# Patient Record
Sex: Female | Born: 1980 | Race: Black or African American | Hispanic: No | Marital: Single | Smoking: Never smoker
Health system: Southern US, Community
[De-identification: ages and names within clinical notes are randomized; demographics above are authoritative.]

## PROBLEM LIST (undated history)

## (undated) DIAGNOSIS — J42 Unspecified chronic bronchitis: Secondary | ICD-10-CM

## (undated) DIAGNOSIS — F32A Depression, unspecified: Secondary | ICD-10-CM

## (undated) DIAGNOSIS — D219 Benign neoplasm of connective and other soft tissue, unspecified: Secondary | ICD-10-CM

## (undated) DIAGNOSIS — F329 Major depressive disorder, single episode, unspecified: Secondary | ICD-10-CM

## (undated) DIAGNOSIS — A159 Respiratory tuberculosis unspecified: Secondary | ICD-10-CM

## (undated) HISTORY — DX: Benign neoplasm of connective and other soft tissue, unspecified: D21.9

## (undated) HISTORY — PX: HYSTEROSCOPY: SHX211

---
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
## 2000-07-01 ENCOUNTER — Ambulatory Visit (HOSPITAL_COMMUNITY): Admission: RE | Admit: 2000-07-01 | Discharge: 2000-07-01 | Payer: Self-pay | Admitting: Family Medicine

## 2000-07-01 ENCOUNTER — Other Ambulatory Visit: Admission: RE | Admit: 2000-07-01 | Discharge: 2000-07-01 | Payer: Self-pay | Admitting: Family Medicine

## 2000-07-01 ENCOUNTER — Encounter: Payer: Self-pay | Admitting: Family Medicine

## 2000-10-30 ENCOUNTER — Emergency Department (HOSPITAL_COMMUNITY): Admission: EM | Admit: 2000-10-30 | Discharge: 2000-10-30 | Payer: Self-pay | Admitting: Emergency Medicine

## 2000-10-30 ENCOUNTER — Encounter: Payer: Self-pay | Admitting: Emergency Medicine

## 2001-04-02 ENCOUNTER — Emergency Department (HOSPITAL_COMMUNITY): Admission: EM | Admit: 2001-04-02 | Discharge: 2001-04-02 | Payer: Self-pay | Admitting: Emergency Medicine

## 2001-04-29 ENCOUNTER — Encounter: Payer: Self-pay | Admitting: Obstetrics and Gynecology

## 2001-04-29 ENCOUNTER — Ambulatory Visit (HOSPITAL_COMMUNITY): Admission: RE | Admit: 2001-04-29 | Discharge: 2001-04-29 | Payer: Self-pay | Admitting: Obstetrics and Gynecology

## 2001-05-21 ENCOUNTER — Ambulatory Visit (HOSPITAL_COMMUNITY): Admission: RE | Admit: 2001-05-21 | Discharge: 2001-05-21 | Payer: Self-pay | Admitting: Obstetrics and Gynecology

## 2001-05-21 ENCOUNTER — Encounter: Payer: Self-pay | Admitting: Obstetrics and Gynecology

## 2001-06-04 ENCOUNTER — Emergency Department (HOSPITAL_COMMUNITY): Admission: EM | Admit: 2001-06-04 | Discharge: 2001-06-04 | Payer: Self-pay | Admitting: Emergency Medicine

## 2001-06-18 ENCOUNTER — Ambulatory Visit (HOSPITAL_COMMUNITY): Admission: RE | Admit: 2001-06-18 | Discharge: 2001-06-18 | Payer: Self-pay | Admitting: Obstetrics and Gynecology

## 2001-06-18 ENCOUNTER — Encounter (INDEPENDENT_AMBULATORY_CARE_PROVIDER_SITE_OTHER): Payer: Self-pay

## 2001-08-05 ENCOUNTER — Inpatient Hospital Stay (HOSPITAL_COMMUNITY): Admission: EM | Admit: 2001-08-05 | Discharge: 2001-08-07 | Payer: Self-pay | Admitting: Psychiatry

## 2001-10-07 ENCOUNTER — Encounter: Payer: Self-pay | Admitting: Obstetrics and Gynecology

## 2001-10-07 ENCOUNTER — Ambulatory Visit (HOSPITAL_COMMUNITY): Admission: RE | Admit: 2001-10-07 | Discharge: 2001-10-07 | Payer: Self-pay | Admitting: Obstetrics and Gynecology

## 2001-10-22 ENCOUNTER — Other Ambulatory Visit: Admission: RE | Admit: 2001-10-22 | Discharge: 2001-10-22 | Payer: Self-pay | Admitting: Obstetrics and Gynecology

## 2001-11-18 ENCOUNTER — Encounter: Payer: Self-pay | Admitting: Obstetrics and Gynecology

## 2001-11-18 ENCOUNTER — Ambulatory Visit (HOSPITAL_COMMUNITY): Admission: RE | Admit: 2001-11-18 | Discharge: 2001-11-18 | Payer: Self-pay | Admitting: Obstetrics and Gynecology

## 2001-12-22 ENCOUNTER — Encounter: Payer: Self-pay | Admitting: Internal Medicine

## 2001-12-22 ENCOUNTER — Ambulatory Visit (HOSPITAL_COMMUNITY): Admission: RE | Admit: 2001-12-22 | Discharge: 2001-12-22 | Payer: Self-pay | Admitting: Internal Medicine

## 2001-12-24 ENCOUNTER — Ambulatory Visit (HOSPITAL_COMMUNITY): Admission: RE | Admit: 2001-12-24 | Discharge: 2001-12-24 | Payer: Self-pay | Admitting: Internal Medicine

## 2001-12-24 ENCOUNTER — Encounter: Payer: Self-pay | Admitting: Internal Medicine

## 2002-03-31 ENCOUNTER — Emergency Department (HOSPITAL_COMMUNITY): Admission: EM | Admit: 2002-03-31 | Discharge: 2002-03-31 | Payer: Self-pay | Admitting: Emergency Medicine

## 2002-12-10 ENCOUNTER — Other Ambulatory Visit: Admission: RE | Admit: 2002-12-10 | Discharge: 2002-12-10 | Payer: Self-pay | Admitting: Obstetrics and Gynecology

## 2003-07-11 ENCOUNTER — Emergency Department (HOSPITAL_COMMUNITY): Admission: EM | Admit: 2003-07-11 | Discharge: 2003-07-11 | Payer: Self-pay | Admitting: Emergency Medicine

## 2003-12-12 ENCOUNTER — Other Ambulatory Visit: Admission: RE | Admit: 2003-12-12 | Discharge: 2003-12-12 | Payer: Self-pay | Admitting: Obstetrics and Gynecology

## 2004-01-04 ENCOUNTER — Ambulatory Visit: Payer: Self-pay | Admitting: Nurse Practitioner

## 2004-01-20 ENCOUNTER — Ambulatory Visit: Payer: Self-pay | Admitting: *Deleted

## 2004-01-20 ENCOUNTER — Ambulatory Visit: Payer: Self-pay | Admitting: Nurse Practitioner

## 2004-02-08 ENCOUNTER — Ambulatory Visit: Payer: Self-pay | Admitting: Nurse Practitioner

## 2004-03-12 ENCOUNTER — Ambulatory Visit: Payer: Self-pay | Admitting: Nurse Practitioner

## 2004-03-30 ENCOUNTER — Ambulatory Visit: Payer: Self-pay | Admitting: Nurse Practitioner

## 2004-05-28 ENCOUNTER — Ambulatory Visit: Payer: Self-pay | Admitting: Nurse Practitioner

## 2004-05-30 ENCOUNTER — Ambulatory Visit: Payer: Self-pay | Admitting: Nurse Practitioner

## 2004-06-28 ENCOUNTER — Ambulatory Visit: Payer: Self-pay | Admitting: Nurse Practitioner

## 2004-09-16 ENCOUNTER — Emergency Department (HOSPITAL_COMMUNITY): Admission: EM | Admit: 2004-09-16 | Discharge: 2004-09-16 | Payer: Self-pay | Admitting: Emergency Medicine

## 2005-01-28 ENCOUNTER — Other Ambulatory Visit: Admission: RE | Admit: 2005-01-28 | Discharge: 2005-01-28 | Payer: Self-pay | Admitting: Obstetrics and Gynecology

## 2005-04-02 ENCOUNTER — Ambulatory Visit: Payer: Self-pay | Admitting: Nurse Practitioner

## 2005-12-04 ENCOUNTER — Ambulatory Visit: Payer: Self-pay | Admitting: Nurse Practitioner

## 2005-12-05 ENCOUNTER — Encounter (INDEPENDENT_AMBULATORY_CARE_PROVIDER_SITE_OTHER): Payer: Self-pay | Admitting: Nurse Practitioner

## 2005-12-05 LAB — CONVERTED CEMR LAB: TSH: 0.467 microintl units/mL

## 2006-01-01 ENCOUNTER — Encounter (INDEPENDENT_AMBULATORY_CARE_PROVIDER_SITE_OTHER): Payer: Self-pay | Admitting: Nurse Practitioner

## 2006-01-01 ENCOUNTER — Ambulatory Visit: Payer: Self-pay | Admitting: Family Medicine

## 2006-01-01 LAB — CONVERTED CEMR LAB: Urinalysis: ABNORMAL

## 2006-01-24 ENCOUNTER — Other Ambulatory Visit: Admission: RE | Admit: 2006-01-24 | Discharge: 2006-01-24 | Payer: Self-pay | Admitting: Family Medicine

## 2006-01-24 ENCOUNTER — Ambulatory Visit: Payer: Self-pay | Admitting: Family Medicine

## 2006-02-03 ENCOUNTER — Ambulatory Visit: Payer: Self-pay | Admitting: Nurse Practitioner

## 2006-02-05 ENCOUNTER — Ambulatory Visit: Payer: Self-pay | Admitting: Nurse Practitioner

## 2006-02-07 ENCOUNTER — Ambulatory Visit: Payer: Self-pay | Admitting: Obstetrics and Gynecology

## 2006-05-21 ENCOUNTER — Inpatient Hospital Stay (HOSPITAL_COMMUNITY): Admission: AD | Admit: 2006-05-21 | Discharge: 2006-05-21 | Payer: Self-pay | Admitting: Obstetrics & Gynecology

## 2006-07-18 ENCOUNTER — Ambulatory Visit: Payer: Self-pay | Admitting: Family Medicine

## 2006-07-28 ENCOUNTER — Encounter (INDEPENDENT_AMBULATORY_CARE_PROVIDER_SITE_OTHER): Payer: Self-pay | Admitting: Nurse Practitioner

## 2006-07-28 DIAGNOSIS — Z87898 Personal history of other specified conditions: Secondary | ICD-10-CM

## 2006-07-28 DIAGNOSIS — K219 Gastro-esophageal reflux disease without esophagitis: Secondary | ICD-10-CM | POA: Insufficient documentation

## 2006-07-28 DIAGNOSIS — F329 Major depressive disorder, single episode, unspecified: Secondary | ICD-10-CM

## 2006-08-19 ENCOUNTER — Ambulatory Visit: Payer: Self-pay | Admitting: Family Medicine

## 2006-08-20 ENCOUNTER — Encounter (INDEPENDENT_AMBULATORY_CARE_PROVIDER_SITE_OTHER): Payer: Self-pay | Admitting: Family Medicine

## 2006-08-20 LAB — CONVERTED CEMR LAB
ALT: 9 units/L (ref 0–35)
AST: 14 units/L (ref 0–37)
Albumin: 4.1 g/dL (ref 3.5–5.2)
Alkaline Phosphatase: 63 units/L (ref 39–117)
Basophils Absolute: 0 10*3/uL (ref 0.0–0.1)
Basophils Relative: 1 % (ref 0–1)
Eosinophils Absolute: 0.2 10*3/uL (ref 0.0–0.7)
Lymphs Abs: 2.8 10*3/uL (ref 0.7–3.3)
MCHC: 32.7 g/dL (ref 30.0–36.0)
MCV: 94.2 fL (ref 78.0–100.0)
Neutrophils Relative %: 45 % (ref 43–77)
Platelets: 256 10*3/uL (ref 150–400)
Potassium: 3.8 meq/L (ref 3.5–5.3)
Sodium: 144 meq/L (ref 135–145)
Total Protein: 6.7 g/dL (ref 6.0–8.3)
WBC: 6.5 10*3/uL (ref 4.0–10.5)

## 2006-08-21 ENCOUNTER — Ambulatory Visit (HOSPITAL_COMMUNITY): Admission: RE | Admit: 2006-08-21 | Discharge: 2006-08-21 | Payer: Self-pay | Admitting: Nurse Practitioner

## 2006-08-27 ENCOUNTER — Encounter: Payer: Self-pay | Admitting: Obstetrics and Gynecology

## 2006-08-27 ENCOUNTER — Ambulatory Visit: Payer: Self-pay | Admitting: Obstetrics and Gynecology

## 2006-09-02 ENCOUNTER — Ambulatory Visit: Payer: Self-pay | Admitting: Internal Medicine

## 2006-09-24 ENCOUNTER — Encounter (INDEPENDENT_AMBULATORY_CARE_PROVIDER_SITE_OTHER): Payer: Self-pay | Admitting: *Deleted

## 2006-12-31 ENCOUNTER — Ambulatory Visit: Payer: Self-pay | Admitting: Family Medicine

## 2007-01-10 ENCOUNTER — Emergency Department (HOSPITAL_COMMUNITY): Admission: EM | Admit: 2007-01-10 | Discharge: 2007-01-10 | Payer: Self-pay | Admitting: Emergency Medicine

## 2007-01-12 ENCOUNTER — Ambulatory Visit (HOSPITAL_COMMUNITY): Admission: RE | Admit: 2007-01-12 | Discharge: 2007-01-12 | Payer: Self-pay | Admitting: Nurse Practitioner

## 2007-01-19 ENCOUNTER — Emergency Department (HOSPITAL_COMMUNITY): Admission: EM | Admit: 2007-01-19 | Discharge: 2007-01-19 | Payer: Self-pay | Admitting: Emergency Medicine

## 2007-12-21 ENCOUNTER — Emergency Department (HOSPITAL_COMMUNITY): Admission: EM | Admit: 2007-12-21 | Discharge: 2007-12-22 | Payer: Self-pay | Admitting: Emergency Medicine

## 2008-01-08 DIAGNOSIS — A159 Respiratory tuberculosis unspecified: Secondary | ICD-10-CM

## 2008-01-08 HISTORY — DX: Respiratory tuberculosis unspecified: A15.9

## 2008-11-30 ENCOUNTER — Ambulatory Visit: Payer: Self-pay | Admitting: Internal Medicine

## 2008-12-19 ENCOUNTER — Ambulatory Visit: Payer: Self-pay | Admitting: Internal Medicine

## 2009-02-10 ENCOUNTER — Ambulatory Visit: Payer: Self-pay | Admitting: Family Medicine

## 2009-03-29 ENCOUNTER — Ambulatory Visit: Payer: Self-pay | Admitting: Family Medicine

## 2009-04-10 ENCOUNTER — Telehealth (INDEPENDENT_AMBULATORY_CARE_PROVIDER_SITE_OTHER): Payer: Self-pay | Admitting: Adult Health

## 2009-04-14 ENCOUNTER — Ambulatory Visit: Payer: Self-pay | Admitting: Family Medicine

## 2009-08-11 ENCOUNTER — Ambulatory Visit: Payer: Self-pay | Admitting: Family Medicine

## 2009-09-01 ENCOUNTER — Inpatient Hospital Stay (HOSPITAL_COMMUNITY): Admission: AD | Admit: 2009-09-01 | Discharge: 2009-09-01 | Payer: Self-pay | Admitting: Obstetrics & Gynecology

## 2009-09-01 ENCOUNTER — Ambulatory Visit: Payer: Self-pay | Admitting: Gynecology

## 2010-01-03 ENCOUNTER — Ambulatory Visit (HOSPITAL_COMMUNITY)
Admission: RE | Admit: 2010-01-03 | Discharge: 2010-01-03 | Payer: Self-pay | Source: Home / Self Care | Attending: Family Medicine | Admitting: Family Medicine

## 2010-01-09 ENCOUNTER — Encounter
Admission: RE | Admit: 2010-01-09 | Discharge: 2010-01-09 | Payer: Self-pay | Source: Home / Self Care | Attending: Infectious Diseases | Admitting: Infectious Diseases

## 2010-01-19 ENCOUNTER — Other Ambulatory Visit: Payer: Self-pay | Admitting: Family Medicine

## 2010-01-19 ENCOUNTER — Encounter (INDEPENDENT_AMBULATORY_CARE_PROVIDER_SITE_OTHER): Payer: Self-pay | Admitting: *Deleted

## 2010-01-28 ENCOUNTER — Encounter: Payer: Self-pay | Admitting: Internal Medicine

## 2010-01-28 ENCOUNTER — Encounter: Payer: Self-pay | Admitting: Family Medicine

## 2010-02-06 NOTE — Progress Notes (Signed)
Summary: vag itching  Phone Note Call from Patient   Summary of Call: 4 days of vaginal itching and swelling, using OTC without relief. Denies any discharge or dysuria. Scheduled for acute visit for today. Initial call taken by: Gaylyn Cheers RN,  April 10, 2009 8:54 AM

## 2010-03-05 ENCOUNTER — Emergency Department (HOSPITAL_COMMUNITY)
Admission: EM | Admit: 2010-03-05 | Discharge: 2010-03-05 | Disposition: A | Discharge status: Home / Self Care | Payer: No Typology Code available for payment source | Attending: Emergency Medicine | Admitting: Emergency Medicine

## 2010-03-05 ENCOUNTER — Emergency Department (HOSPITAL_COMMUNITY): Payer: Self-pay

## 2010-03-05 DIAGNOSIS — Z043 Encounter for examination and observation following other accident: Secondary | ICD-10-CM | POA: Insufficient documentation

## 2010-03-05 DIAGNOSIS — M549 Dorsalgia, unspecified: Secondary | ICD-10-CM | POA: Insufficient documentation

## 2010-03-05 LAB — URINALYSIS, ROUTINE W REFLEX MICROSCOPIC
Bilirubin Urine: NEGATIVE
Ketones, ur: NEGATIVE mg/dL
Leukocytes, UA: NEGATIVE
Nitrite: NEGATIVE
Protein, ur: NEGATIVE mg/dL
Specific Gravity, Urine: 1.007 (ref 1.005–1.030)
Urine Glucose, Fasting: NEGATIVE mg/dL
Urobilinogen, UA: 0.2 mg/dL (ref 0.0–1.0)
pH: 7 (ref 5.0–8.0)

## 2010-03-05 LAB — POCT PREGNANCY, URINE

## 2010-03-05 LAB — URINE MICROSCOPIC-ADD ON

## 2010-03-22 LAB — WET PREP, GENITAL: Trich, Wet Prep: NONE SEEN

## 2010-03-22 LAB — URINALYSIS, ROUTINE W REFLEX MICROSCOPIC
Bilirubin Urine: NEGATIVE
Hgb urine dipstick: NEGATIVE
Ketones, ur: NEGATIVE mg/dL
Nitrite: NEGATIVE
pH: 7.5 (ref 5.0–8.0)

## 2010-03-22 LAB — GC/CHLAMYDIA PROBE AMP, GENITAL

## 2010-05-25 NOTE — H&P (Signed)
NAME:  Carla Hodges, HACKLEMAN                       ACCOUNT NO.:  [id number]   MEDICAL RECORD NO.:  [id number]                   PATIENT TYPE:  IPS   LOCATION:  0506                                 [facility]ILITY:  [facility]   PHYSICIAN:  Margaret A. Scott, N.P.             DATE OF BIRTH:  [birth year]   DATE OF ADMISSION:  08/05/2001  DATE OF DISCHARGE:                         PSYCHIATRIC ADMISSION ASSESSMENT   REASON FOR ADMISSION:  The patient states That's the last time I ever do  anything like that.  This is a 30 year old single African-American female.  Voluntary admission.   HISTORY OF PRESENT ILLNESS:  This is a 30 year old, with no history of mood  disorder or mental illness, took a handful of Tylenol, according to the  emergency room records.  She was taken by her friend to the emergency room,  where she was medically cleared.  Today, she reports that she did this  immediately after an argument with her friend over the television.  The  patient endorses 2-3 days of increased irritability.  She has been angry  with her supervisor at work about the way he speaks to her and she also  reports that she has been stressed by her grandmother's constant concerns  about money and talking about death.  The patient admits to vague suicidal  ideation in conjunction with the overdose but denies that she is suicidal  today and insists that she is not depressed and is insistent that she would  never do anything like this again.  She denies any homicidal ideation or  auditory or visual hallucinations.  She denies any cyclic characteristic to  her irritability and reports she started her menses today.  She denies any  panic or anxiety.  She denies any changes in sleep or appetite.   PAST PSYCHIATRIC HISTORY:  The patient has no prior treatment history.   SOCIAL HISTORY:  The patient is originally from [place].  She moved  to [place] at age 31 after her mother died of cirrhosis of the liver.  She moved here to live with her grandmother.  Her father is absent.  She  currently lives in [address] 8 housing with her grandmother here.  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  The  patient currently works at [employer].   FAMILY HISTORY:  The patient denies any family history of substance abuse or  mental illness.   ALCOHOL/DRUG HISTORY:  The patient denies any substance abuse.  No prior  history of substance abuse.  A urine drug screen was negative.   PAST MEDICAL HISTORY:  The patient is followed by her local gynecologist.  She denies any medical problems.   MEDICATIONS:  The only medications are Ortho Tri-Cyclen, oral contraceptive,  1 tab daily.   ALLERGIES:  None.   POSITIVE PHYSICAL FINDINGS:  The patient's physical examination was done in  the emergency room and was essentially negative.  She was treated there with  ipecac.  The patient is 5 feet 5 inches tall and weighs 243 pounds.  Vital  signs, on admission, temperature 98.6, pulse 58, respirations 22, blood  pressure 102/80.   LABORATORY DATA:  Her metabolic panel is remarkable for an albumin at 3.0.  Urinalysis was within normal limits.  CBC was within normal limits.  Acetaminophen was essentially 32.4 and, on recheck this morning, at 10.0.  Thyroid panel is pending.   MENTAL STATUS EXAM:  This is an obese, tall female, who is healthy in  appearance with normal motor activity.  She had a somewhat irritable affect  and an aloof manner.  She is minimally cooperative with normal speech but  with clipped, minimal answers.  Mood is irritable.  She minimizes her  suicide attempt and demonstrates considerable denial about the seriousness  of what she did.  She minimizes her irritability and is generally unwilling  to discuss symptoms.  Thought process is without deficits, basically goal  directed.  No  evidence of psychosis, homicidal ideation or suicidal  ideation.  Cognitively, she is intact and oriented x 3.  Intelligence is  average.  Impulse control unclear.  Judgment fair.  Insight poor.   DIAGNOSES:  Axis I:  Rule out major depression, single episode.  Axis II:  None.  Axis III:  Status post acetaminophen overdose.  Axis IV:  Moderate (conflict with her grandmother).  Axis V:  Current 42; past year 70.   PLAN:  Voluntarily admit the patient to stabilize her medically and to  evaluate the suicidal gesture with the goal of alleviating any possible  suicidal ideation that she has.  We are going to ask the case manager, as  soon as possible, to get in touch with her grandmother to evaluate for any  concerns there and we would recommend Lexapro 5 mg but the patient, at this  point, is declining to consider any medications.  We will continue to  observe her and review the need for medications, possibly tomorrow, after we  are able to gain some more perspective on her condition and her mood.   ESTIMATED LENGTH OF STAY:  Two to three days.                                               Margaret A. Lorin Picket, N.P.    MAS/MEDQ  D:  08/05/2001  T:  08/06/2001  Job:  [id number]

## 2010-05-25 NOTE — H&P (Signed)
[facility]  Patient:    ASEES, MANFREDI Visit Number: [id number] MRN: [id number]          Service Type: EMS Location: ED Attending Physician:  Shelba Flake Dictated by:   Pierre Bali Normand Sloop, M.D. Admit Date:  06/04/2001 Discharge Date: 06/04/2001                           History and Physical  DATE OF BIRTH:                12-06-1980  HISTORY OF PRESENT ILLNESS:   The patient is a 29 year old, G0, who presented to me in April 2003, with irregular periods.  She began menarche at age 22. occurring every 28 days, lasting from 3-4 days.  The patient had two episodes of bleeding in March, one on the 15th and one on the 27th.  She changed her tampons about every 2-3 hours.  She did not have any chest pain, shortness of breath, or dizziness.  An ultrasound was done which showed a thickened endometrium.  A sonohysterogram was done which showed multiple adjacent endometrial polyps in the proximal aspect of the uterine body, and there is also an aspect of polyp along the posterior aspect of the uterus which measured about 5 mm in diameter.  Her GC and Chlamydia cultures were negative. Her previous ultrasound before the sonohysterogram just showed an inhomogeneous thickened endometrium.  It showed a normal size uterus measuring 7.7 x 3.4 x 5.2, and the ovary was a small follicle.  The left ovary was normal, and there was some physiological fluid in the cul-de-sac.  ALLERGIES:                    The patient has no known drug allergies.  PAST MEDICAL HISTORY:         She denies any significant past medical or surgical history.  She is a nulligravida.  SOCIAL HISTORY:               Negative x 3.  Her last Pap smear was June 12, 2000.  The patient is sexually active but states she uses a condom 100% of the time.  She does breast exams every month and denies any history of abnormal Pap smears or sexual transmitted diseases.  PHYSICAL  EXAMINATION:  VITAL SIGNS:                  The patient is 250 pounds.  Blood pressure is 110/80.  HEART:                        Regular.  LUNGS:                        Clear.  ABDOMEN:                      Soft and nontender.  VULVA/VAGINAL:                Within normal limits.  Cervix is nontender without any lesions.  Uterus is normal size, shape, and consistence.  Adnexa has no masses.  IMPRESSION:                   The patient has menometrorrhagia with endometrial polyps.  The patient does not want any birth control or Depo-Provera or any cycling to help  with intermenstrual spotting.  I told her that we can proceed with a D&C hysteroscopy to remove the polyps, and the patient consented to the procedure.  She understands the risks are, but limited to, bleeding, infection, perforation of the uterus. Dictated by:   Pierre Bali. Normand Sloop, M.D. Attending Physician:  Shelba Flake DD:  06/17/01 TD:  06/17/01 Job: [id number]

## 2010-05-25 NOTE — Discharge Summary (Signed)
NAME:  Carla Hodges, Carla Hodges                       ACCOUNT NO.:  [id number]   MEDICAL RECORD NO.:  [id number]                   PATIENT TYPE:  IPS   LOCATION:  0506                                 [facility]ACILITY:  [facility]   PHYSICIAN:  Jeanice Lim, MD                DATE OF BIRTH:  Mar 13, 1980   DATE OF ADMISSION:  08/05/2001  DATE OF DISCHARGE:  08/07/2001                                 DISCHARGE SUMMARY   IDENTIFYING DATA:  This is a 30 year old female with no history of a mood  disorder, although she took a handful of Tylenol and was taken to the  emergency room by a friend after an argument with her friend over the  television.  She endorsed 2-3 days of irritability and anger at her  supervisor.  No previous psych history.   ADMISSION MEDICATIONS:  Ortho Tri-Cyclen.   ALLERGIES:  No known drug allergies.   PHYSICAL EXAMINATION:  Essentially within normal limits, neurologically  nonfocal.   ROUTINE ADMISSION LABS:  CMET was remarkable for an albumin of 3.  CBC  within normal limits.  Acetaminophen level initially 32.4, then down to 10.   MENTAL STATUS EXAM:  Obese, tall female, healthy in appearance, normal  psychomotor activity, somewhat irritable and aloof, minimally cooperative.  Mood was irritable.  Minimizes suicide attempt, denying that it was serious.  Thought process was goal directed.  Thought content was negative for acute  suicidal or homicidal ideation or psychosis symptoms.   ADMISSION DIAGNOSES:   AXIS I:  Major depression, single episode.   AXIS II:  None.   AXIS III:  Status post Tylenol overdose.   AXIS IV:  Moderate problems, conflict with the grandmother.   AXIS V:  40/68.   HOSPITAL COURSE:  The patient was admitted and ordered routine p.r.n.  medications, underwent further monitoring.  A family meeting was held.  Family felt the patient had used poor judgment but this was not typical for  and were comfortable with aftercare plan.   CONDITION ON  DISCHARGE:  Improved.  Mood was more euthymic, affect brighter,  thought process goal directed.  Thought content negative for dangerous  ideation.  Judgment and insight appeared to have improved, noting the  seriousness of what she had done.   DISCHARGE MEDICATIONS:  She was not discharged on psychotropics.   DISPOSITION:  Follow up with [facility] within 1 week after discharge.   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   AXIS II:  None.   AXIS III:  Status post Tylenol overdose.   AXIS IV:  Moderate problems, conflict with the grandmother.   AXIS V:  Global assessment of function on discharge was 60.  Jeanice Lim, MD    JEM/MEDQ  D:  09/08/2001  T:  09/08/2001  Job:  [id number]

## 2010-05-25 NOTE — Op Note (Signed)
[facility]  Patient:    Carla Hodges, Carla Hodges Visit Number: [id number] MRN: [id number]          Service Type: DSU Location: [facility] Attending Physician:  Jaymes Graff A Dictated by:   Pierre Bali Normand Sloop, M.D. Admit Date:  06/18/2001 Discharge Date: 06/18/2001                             Operative Report  DATE OF BIRTH:                [birth year]  PREOPERATIVE DIAGNOSES:       Metrorrhagia with uterine polyps.  POSTOPERATIVE DIAGNOSES:      Metrorrhagia with uterine polyps.  PROCEDURE:                    Dilatation and curettage, hysteroscopy, polypectomy.  SURGEON:                      Naima A. Normand Sloop, M.D.  ANESTHESIA:                   MAC and cervical block.  ESTIMATED BLOOD LOSS:         Minimal.  DEFICIT:                      40 cc deficit of 3% sorbitol.  INTRAVENOUS FLUIDS:           800 cc crystalloid.  COMPLICATIONS:                None.  FINDINGS:                     Normal sized retroverted uterus that sounded to 8 cm, regular uterine cavity with a small polyp on the posterior wall of the uterus, very fluffy endometrium.  DISPOSITION:                  The patient went to recovery room in stable condition.  PROCEDURE IN DETAIL:          The patient was taken to the operating room where she was placed in the dorsal lithotomy, prepped and draped in a normal sterile fashion.  A bivalve speculum was placed into the vagina.  The anterior lip of the cervix was grasped with a single tooth tenaculum.  Lidocaine 1% 10 cc was used for cervical block.  The uterus was then sounded to 8 cm.  Before the bivalve speculum was placed the patient was found to have a normal sized uterus with no adnexal masses on examination.  The uterus was then sounded with Shawnie Pons dilators to 21.  The hysteroscope was then placed into the uterine cavity and the findings noted above were seen.  The polyp forceps were then placed into the uterine cavity and  polyps were removed.  A D&C was done and moderate amount of endometrial curettings were obtained.  There was no irregularity in the cavity and following D&C.  The hysteroscope was then replaced.  There was no longer any polyps seen.  The hysteroscope then reached all the way to the fundus and out of the endocervix and no polyps or abnormalities were seen.  All instruments were removed from the vagina.  The tenaculum was removed from the cervix with good hemostasis noted.  Sponge and lap counts were correct x2.  Patient went to recovery room in  stable condition. Dictated by:   Pierre Bali. Normand Sloop, M.D. Attending Physician:  Michael Litter DD:  06/18/01 TD:  06/20/01 Job: [id number]

## 2010-09-26 LAB — URINALYSIS, ROUTINE W REFLEX MICROSCOPIC
Bilirubin Urine: NEGATIVE
Glucose, UA: NEGATIVE
Hgb urine dipstick: NEGATIVE
Ketones, ur: NEGATIVE
Protein, ur: NEGATIVE
Urobilinogen, UA: 1

## 2010-12-12 ENCOUNTER — Ambulatory Visit: Payer: Self-pay | Attending: Family Medicine | Admitting: Physical Therapy

## 2010-12-12 DIAGNOSIS — M545 Low back pain, unspecified: Secondary | ICD-10-CM | POA: Insufficient documentation

## 2010-12-12 DIAGNOSIS — IMO0001 Reserved for inherently not codable concepts without codable children: Secondary | ICD-10-CM | POA: Insufficient documentation

## 2010-12-12 DIAGNOSIS — M2569 Stiffness of other specified joint, not elsewhere classified: Secondary | ICD-10-CM | POA: Insufficient documentation

## 2010-12-17 ENCOUNTER — Ambulatory Visit: Payer: Self-pay | Admitting: Rehabilitation

## 2010-12-19 ENCOUNTER — Ambulatory Visit: Payer: Self-pay | Admitting: Physical Therapy

## 2010-12-24 ENCOUNTER — Ambulatory Visit: Payer: Self-pay | Admitting: Physical Therapy

## 2011-01-02 ENCOUNTER — Ambulatory Visit: Payer: Self-pay | Admitting: Physical Therapy

## 2011-01-10 ENCOUNTER — Ambulatory Visit: Payer: Self-pay | Admitting: Physical Therapy

## 2011-01-21 ENCOUNTER — Ambulatory Visit (HOSPITAL_COMMUNITY)
Admission: RE | Admit: 2011-01-21 | Discharge: 2011-01-21 | Disposition: A | Discharge status: Home / Self Care | Payer: Self-pay | Source: Ambulatory Visit | Attending: Family Medicine | Admitting: Family Medicine

## 2011-01-21 ENCOUNTER — Other Ambulatory Visit (HOSPITAL_COMMUNITY): Payer: Self-pay | Admitting: Family Medicine

## 2011-01-21 DIAGNOSIS — R7611 Nonspecific reaction to tuberculin skin test without active tuberculosis: Secondary | ICD-10-CM | POA: Insufficient documentation

## 2011-05-07 ENCOUNTER — Other Ambulatory Visit: Payer: Self-pay | Admitting: Family Medicine

## 2011-06-25 ENCOUNTER — Encounter (HOSPITAL_COMMUNITY): Payer: Self-pay | Admitting: *Deleted

## 2011-06-25 ENCOUNTER — Emergency Department (HOSPITAL_COMMUNITY)
Admission: EM | Admit: 2011-06-25 | Discharge: 2011-06-25 | Disposition: A | Discharge status: Home / Self Care | Payer: Self-pay | Attending: Emergency Medicine | Admitting: Emergency Medicine

## 2011-06-25 DIAGNOSIS — R42 Dizziness and giddiness: Secondary | ICD-10-CM | POA: Insufficient documentation

## 2011-06-25 DIAGNOSIS — R51 Headache: Secondary | ICD-10-CM | POA: Insufficient documentation

## 2011-06-25 DIAGNOSIS — R110 Nausea: Secondary | ICD-10-CM | POA: Insufficient documentation

## 2011-06-25 DIAGNOSIS — IMO0001 Reserved for inherently not codable concepts without codable children: Secondary | ICD-10-CM | POA: Insufficient documentation

## 2011-06-25 DIAGNOSIS — R079 Chest pain, unspecified: Secondary | ICD-10-CM | POA: Insufficient documentation

## 2011-06-25 LAB — CBC
HCT: 38.8 % (ref 36.0–46.0)
MCH: 29.1 pg (ref 26.0–34.0)
MCHC: 32.7 g/dL (ref 30.0–36.0)
MCV: 89 fL (ref 78.0–100.0)
Platelets: 350 10*3/uL (ref 150–400)
RDW: 16.7 % — ABNORMAL HIGH (ref 11.5–15.5)

## 2011-06-25 LAB — DIFFERENTIAL
Lymphocytes Relative: 7 % — ABNORMAL LOW (ref 12–46)
Lymphs Abs: 0.6 10*3/uL — ABNORMAL LOW (ref 0.7–4.0)
Monocytes Absolute: 0.4 10*3/uL (ref 0.1–1.0)
Monocytes Relative: 5 % (ref 3–12)
Neutro Abs: 7.1 10*3/uL (ref 1.7–7.7)

## 2011-06-25 LAB — URINE MICROSCOPIC-ADD ON

## 2011-06-25 LAB — URINALYSIS, ROUTINE W REFLEX MICROSCOPIC
Hgb urine dipstick: NEGATIVE
Protein, ur: NEGATIVE mg/dL
Urobilinogen, UA: 0.2 mg/dL (ref 0.0–1.0)

## 2011-06-25 LAB — COMPREHENSIVE METABOLIC PANEL
Alkaline Phosphatase: 91 U/L (ref 39–117)
BUN: 5 mg/dL — ABNORMAL LOW (ref 6–23)
CO2: 24 mEq/L (ref 19–32)
Chloride: 102 mEq/L (ref 96–112)
Creatinine, Ser: 0.67 mg/dL (ref 0.50–1.10)
GFR calc non Af Amer: >90 mL/min (ref >90)
Potassium: 3.7 mEq/L (ref 3.5–5.1)
Total Bilirubin: 0.9 mg/dL (ref 0.3–1.2)

## 2011-06-25 LAB — LIPASE, BLOOD: Lipase: 14 U/L (ref 11–59)

## 2011-06-25 MED ORDER — ONDANSETRON HCL 4 MG/2ML IJ SOLN
4.0000 mg | Freq: Once | INTRAMUSCULAR | Status: AC
  Administered 2011-06-25: 4 mg via INTRAVENOUS
  Filled 2011-06-25: qty 2

## 2011-06-25 MED ORDER — ONDANSETRON 8 MG PO TBDP: 8.0000 mg | ORAL_TABLET | Freq: Three times a day (TID) | ORAL | Status: AC | PRN

## 2011-06-25 MED ORDER — SODIUM CHLORIDE 0.9 % IV BOLUS (SEPSIS)
500.0000 mL | Freq: Once | INTRAVENOUS | Status: AC
  Administered 2011-06-25: 500 mL via INTRAVENOUS

## 2011-06-25 MED ORDER — TRAMADOL HCL 50 MG PO TABS: 50.0000 mg | ORAL_TABLET | Freq: Four times a day (QID) | ORAL | Status: AC | PRN

## 2011-06-25 MED ORDER — MORPHINE SULFATE 4 MG/ML IJ SOLN
4.0000 mg | Freq: Once | INTRAMUSCULAR | Status: AC
  Administered 2011-06-25: 4 mg via INTRAVENOUS
  Filled 2011-06-25: qty 1

## 2011-06-25 NOTE — ED Provider Notes (Signed)
History     CSN: [id number]  Arrival date & time 06/25/11  1357   First MD Initiated Contact with Patient 06/25/11 1543      Chief Complaint  Patient presents with  . Dizziness  . Chest Pain  . Muscle Pain    (Consider location/radiation/quality/duration/timing/severity/associated sxs/prior treatment) Patient is a 31 y.o. female presenting with chest pain and musculoskeletal pain. The history is provided by the patient.  Chest Pain Primary symptoms include fatigue, abdominal pain and nausea. Pertinent negatives for primary symptoms include no fever, no cough, no vomiting and no dizziness.    Muscle Pain Associated symptoms include chest pain and abdominal pain.   patient presents with multiple complaints. She states she is nauseousness no vomiting she is lightheadedness and general bodyaches. She has suprapubic pain. No vaginal bleeding or discharge. No clear dysuria. She states she ate some broccoli and cheese soup yesterday. She states she has a headache. She states she is right-sided chest pain. No sweating. No fevers. No cough.  History reviewed. No pertinent past medical history.  Past Surgical History  Procedure Date  . Hysteroscopy     No family history on file.  History  Substance Use Topics  . Smoking status: Never Smoker   . Smokeless tobacco: Not on file  . Alcohol Use: Yes     occasionally    OB History    Grav Para Term Preterm Abortions TAB SAB Ect Mult Living                  Review of Systems  Constitutional: Positive for appetite change and fatigue. Negative for fever and chills.  HENT: Negative for ear pain and sore throat.   Respiratory: Negative for cough.   Cardiovascular: Positive for chest pain. Negative for leg swelling.  Gastrointestinal: Positive for nausea and abdominal pain. Negative for vomiting, diarrhea and constipation.  Genitourinary: Positive for frequency. Negative for dysuria.  Musculoskeletal: Positive for myalgias. Negative  for back pain.  Skin: Negative for pallor.  Neurological: Positive for light-headedness. Negative for dizziness.  Psychiatric/Behavioral: Negative for behavioral problems.    Allergies  Latex  Home Medications   Current Outpatient Rx  Name Route Sig Dispense Refill  . ONDANSETRON 8 MG PO TBDP Oral Take 1 tablet (8 mg total) by mouth every 8 (eight) hours as needed for nausea. 20 tablet 0  . TRAMADOL HCL 50 MG PO TABS Oral Take 1 tablet (50 mg total) by mouth every 6 (six) hours as needed for pain. 15 tablet 0    BP 138/59  Pulse 98  Temp 99.1 F (37.3 C) (Oral)  Resp 18  SpO2 98%  LMP 06/09/2011  Physical Exam  Nursing note and vitals reviewed. Constitutional: She is oriented to person, place, and time. She appears well-developed and well-nourished.  HENT:  Head: Normocephalic and atraumatic.  Eyes: EOM are normal. Pupils are equal, round, and reactive to light.  Neck: Normal range of motion. Neck supple.  Cardiovascular: Normal rate, regular rhythm and normal heart sounds.   No murmur heard. Pulmonary/Chest: Effort normal and breath sounds normal. No respiratory distress. She has no wheezes. She has no rales.  Abdominal: Soft. Bowel sounds are normal. She exhibits no distension. There is tenderness. There is no rebound and no guarding.       Tenderness to bilateral upperabdomen and epigastric area. No rebound or guarding/  Musculoskeletal: Normal range of motion.  Neurological: She is alert and oriented to person, place, and time. No cranial  nerve deficit.  Skin: Skin is warm and dry.  Psychiatric: She has a normal mood and affect. Her speech is normal.    ED Course  Procedures (including critical care time)  Labs Reviewed  CBC - Abnormal; Notable for the following:    RDW 16.7 (*)     All other components within normal limits  DIFFERENTIAL - Abnormal; Notable for the following:    Neutrophils Relative 87 (*)     Lymphocytes Relative 7 (*)     Lymphs Abs 0.6 (*)      All other components within normal limits  COMPREHENSIVE METABOLIC PANEL - Abnormal; Notable for the following:    BUN 5 (*)     Albumin 3.3 (*)     All other components within normal limits  URINALYSIS, ROUTINE W REFLEX MICROSCOPIC - Abnormal; Notable for the following:    Color, Urine ORANGE (*)  BIOCHEMICALS MAY BE AFFECTED BY COLOR   Bilirubin Urine SMALL (*)     Ketones, ur TRACE (*)     Leukocytes, UA SMALL (*)     All other components within normal limits  URINE MICROSCOPIC-ADD ON - Abnormal; Notable for the following:    Squamous Epithelial / LPF FEW (*)     Bacteria, UA FEW (*)     All other components within normal limits  LIPASE, BLOOD  POCT PREGNANCY, URINE  POCT I-STAT TROPONIN I   No results found.   1. Nausea   2. Headache     Date: 06/25/2011  Rate: 103  Rhythm: sinus tachycardia  QRS Axis: normal  Intervals: normal  ST/T Wave abnormalities: normal  Conduction Disutrbances:none  Narrative Interpretation: now faster.  Old EKG Reviewed: changes noted    MDM  Patient with multiple complaints. Headache right chest pain nausea myalgias suprapubic pain. Lab work is overall reassuring. Nonfocal exam. Lungs clear. Patient feels somewhat better after treatment and will be discharged home. She'll followup as needed with [facility]        Mohmed Farver R. Rubin Payor, MD 06/25/11 [phone]

## 2011-06-25 NOTE — ED Notes (Signed)
Pt reports chest tightness in R chest with associated mild nausea. Denies vomiting, sob, diaphoresis. C/o lightheadedness and generalized body aches. Also reports suprapubic pain that is sharp. Denies vaginal bleeding/discharge.

## 2011-06-25 NOTE — Discharge Instructions (Signed)
Headache, General, Unknown Cause The specific cause of your headache may not have been found today. There are many causes and types of headache. A few common ones are:  Tension headache.   Migraine.   Infections (examples: dental and sinus infections).   Bone and/or joint problems in the neck or jaw.   Depression.   Eye problems.  These headaches are not life threatening.  Headaches can sometimes be diagnosed by a patient history and a physical exam. Sometimes, lab and imaging studies (such as x-ray and/or CT scan) are used to rule out more serious problems. In some cases, a spinal tap (lumbar puncture) may be requested. There are many times when your exam and tests may be normal on the first visit even when there is a serious problem causing your headaches. Because of that, it is very important to follow up with your doctor or local clinic for further evaluation. FINDING OUT THE RESULTS OF TESTS  If a radiology test was performed, a radiologist will review your results.   You will be contacted by the emergency department or your physician if any test results require a change in your treatment plan.   Not all test results may be available during your visit. If your test results are not back during the visit, make an appointment with your caregiver to find out the results. Do not assume everything is normal if you have not heard from your caregiver or the medical facility. It is important for you to follow up on all of your test results.  HOME CARE INSTRUCTIONS   Keep follow-up appointments with your caregiver, or any specialist referral.   Only take over-the-counter or prescription medicines for pain, discomfort, or fever as directed by your caregiver.   Biofeedback, massage, or other relaxation techniques may be helpful.   Ice packs or heat applied to the head and neck can be used. Do this three to four times per day, or as needed.   Call your doctor if you have any questions or  concerns.   If you smoke, you should quit.  SEEK MEDICAL CARE IF:   You develop problems with medications prescribed.   You do not respond to or obtain relief from medications.   You have a change from the usual headache.   You develop nausea or vomiting.  SEEK IMMEDIATE MEDICAL CARE IF:   If your headache becomes severe.   You have an unexplained oral temperature above 102 F (38.9 C), or as your caregiver suggests.   You have a stiff neck.   You have loss of vision.   You have muscular weakness.   You have loss of muscular control.   You develop severe symptoms different from your first symptoms.   You start losing your balance or have trouble walking.   You feel faint or pass out.  MAKE SURE YOU:   Understand these instructions.   Will watch your condition.   Will get help right away if you are not doing well or get worse.  Document Released: 12/24/2004 Document Revised: 12/13/2010 Document Reviewed: 08/13/2007 [facility] Patient Information 2012 [place], [place].Nausea and Vomiting Nausea is a sick feeling that often comes before throwing up (vomiting). Vomiting is a reflex where stomach contents come out of your mouth. Vomiting can cause severe loss of body fluids (dehydration). Children and elderly adults can become dehydrated quickly, especially if they also have diarrhea. Nausea and vomiting are symptoms of a condition or disease. It is important to find the  cause of your symptoms. CAUSES   Direct irritation of the stomach lining. This irritation can result from increased acid production (gastroesophageal reflux disease), infection, food poisoning, taking certain medicines (such as nonsteroidal anti-inflammatory drugs), alcohol use, or tobacco use.   Signals from the brain.These signals could be caused by a headache, heat exposure, an inner ear disturbance, increased pressure in the brain from injury, infection, a tumor, or a concussion, pain, emotional stimulus,  or metabolic problems.   An obstruction in the gastrointestinal tract (bowel obstruction).   Illnesses such as diabetes, hepatitis, gallbladder problems, appendicitis, kidney problems, cancer, sepsis, atypical symptoms of a heart attack, or eating disorders.   Medical treatments such as chemotherapy and radiation.   Receiving medicine that makes you sleep (general anesthetic) during surgery.  DIAGNOSIS Your caregiver may ask for tests to be done if the problems do not improve after a few days. Tests may also be done if symptoms are severe or if the reason for the nausea and vomiting is not clear. Tests may include:  Urine tests.   Blood tests.   Stool tests.   Cultures (to look for evidence of infection).   X-rays or other imaging studies.  Test results can help your caregiver make decisions about treatment or the need for additional tests. TREATMENT You need to stay well hydrated. Drink frequently but in small amounts.You may wish to drink water, sports drinks, clear broth, or eat frozen ice pops or gelatin dessert to help stay hydrated.When you eat, eating slowly may help prevent nausea.There are also some antinausea medicines that may help prevent nausea. HOME CARE INSTRUCTIONS   Take all medicine as directed by your caregiver.   If you do not have an appetite, do not force yourself to eat. However, you must continue to drink fluids.   If you have an appetite, eat a normal diet unless your caregiver tells you differently.   Eat a variety of complex carbohydrates (rice, wheat, potatoes, bread), lean meats, yogurt, fruits, and vegetables.   Avoid high-fat foods because they are more difficult to digest.   Drink enough water and fluids to keep your urine clear or pale yellow.   If you are dehydrated, ask your caregiver for specific rehydration instructions. Signs of dehydration may include:   Severe thirst.   Dry lips and mouth.   Dizziness.   Dark urine.    Decreasing urine frequency and amount.   Confusion.   Rapid breathing or pulse.  SEEK IMMEDIATE MEDICAL CARE IF:   You have blood or brown flecks (like coffee grounds) in your vomit.   You have black or bloody stools.   You have a severe headache or stiff neck.   You are confused.   You have severe abdominal pain.   You have chest pain or trouble breathing.   You do not urinate at least once every 8 hours.   You develop cold or clammy skin.   You continue to vomit for longer than 24 to 48 hours.   You have a fever.  MAKE SURE YOU:   Understand these instructions.   Will watch your condition.   Will get help right away if you are not doing well or get worse.  Document Released: 12/24/2004 Document Revised: 12/13/2010 Document Reviewed: 05/23/2010 [facility] Patient Information 2012 [place], [place].

## 2011-06-28 ENCOUNTER — Emergency Department (HOSPITAL_COMMUNITY)
Admission: EM | Admit: 2011-06-28 | Discharge: 2011-06-28 | Disposition: A | Discharge status: Home / Self Care | Payer: Self-pay | Attending: Emergency Medicine | Admitting: Emergency Medicine

## 2011-06-28 DIAGNOSIS — R1110 Vomiting, unspecified: Secondary | ICD-10-CM

## 2011-06-28 LAB — CBC
HCT: 37.8 % (ref 36.0–46.0)
Hemoglobin: 12.6 g/dL (ref 12.0–15.0)
MCH: 29.4 pg (ref 26.0–34.0)
MCHC: 33.3 g/dL (ref 30.0–36.0)
MCV: 88.1 fL (ref 78.0–100.0)
Platelets: 319 10*3/uL (ref 150–400)
RBC: 4.29 MIL/uL (ref 3.87–5.11)
RDW: 16.7 % — ABNORMAL HIGH (ref 11.5–15.5)
WBC: 5.1 10*3/uL (ref 4.0–10.5)

## 2011-06-28 LAB — COMPREHENSIVE METABOLIC PANEL
ALT: 34 U/L (ref 0–35)
AST: 32 U/L (ref 0–37)
Albumin: 3.2 g/dL — ABNORMAL LOW (ref 3.5–5.2)
Alkaline Phosphatase: 87 U/L (ref 39–117)
BUN: 5 mg/dL — ABNORMAL LOW (ref 6–23)
CO2: 27 mEq/L (ref 19–32)
Calcium: 8.7 mg/dL (ref 8.4–10.5)
Chloride: 104 mEq/L (ref 96–112)
Creatinine, Ser: 0.66 mg/dL (ref 0.50–1.10)
GFR calc Af Amer: >90 mL/min (ref >90)
GFR calc non Af Amer: >90 mL/min (ref >90)
Glucose, Bld: 87 mg/dL (ref 70–99)
Potassium: 3.2 mEq/L — ABNORMAL LOW (ref 3.5–5.1)
Sodium: 140 mEq/L (ref 135–145)
Total Bilirubin: 0.5 mg/dL (ref 0.3–1.2)
Total Protein: 6.9 g/dL (ref 6.0–8.3)

## 2011-06-28 LAB — URINALYSIS, ROUTINE W REFLEX MICROSCOPIC
Glucose, UA: NEGATIVE mg/dL
Hgb urine dipstick: NEGATIVE
Ketones, ur: 15 mg/dL — AB
Nitrite: POSITIVE — AB
Protein, ur: 30 mg/dL — AB
Specific Gravity, Urine: 1.034 — ABNORMAL HIGH (ref 1.005–1.030)
Urobilinogen, UA: 1 mg/dL (ref 0.0–1.0)
pH: 5.5 (ref 5.0–8.0)

## 2011-06-28 LAB — DIFFERENTIAL
Basophils Absolute: 0 10*3/uL (ref 0.0–0.1)
Basophils Relative: 1 % (ref 0–1)
Eosinophils Absolute: 0.1 10*3/uL (ref 0.0–0.7)
Eosinophils Relative: 2 % (ref 0–5)
Lymphocytes Relative: 48 % — ABNORMAL HIGH (ref 12–46)
Lymphs Abs: 2.4 10*3/uL (ref 0.7–4.0)
Monocytes Absolute: 0.6 10*3/uL (ref 0.1–1.0)
Monocytes Relative: 11 % (ref 3–12)
Neutro Abs: 2 10*3/uL (ref 1.7–7.7)
Neutrophils Relative %: 39 % — ABNORMAL LOW (ref 43–77)

## 2011-06-28 LAB — URINE MICROSCOPIC-ADD ON

## 2011-06-28 LAB — PREGNANCY, URINE: Preg Test, Ur: NEGATIVE

## 2011-06-28 MED ORDER — HYDROCODONE-ACETAMINOPHEN 5-325 MG PO TABS: 1.0000 | ORAL_TABLET | Freq: Four times a day (QID) | ORAL | Status: AC | PRN

## 2011-06-28 MED ORDER — PANTOPRAZOLE SODIUM 40 MG PO TBEC
40.0000 mg | DELAYED_RELEASE_TABLET | Freq: Once | ORAL | Status: AC
  Administered 2011-06-28: 40 mg via ORAL
  Filled 2011-06-28: qty 1

## 2011-06-28 MED ORDER — SODIUM CHLORIDE 0.9 % IV BOLUS (SEPSIS)
1000.0000 mL | Freq: Once | INTRAVENOUS | Status: AC
  Administered 2011-06-28: 1000 mL via INTRAVENOUS

## 2011-06-28 MED ORDER — HYDROCODONE-ACETAMINOPHEN 5-325 MG PO TABS
1.0000 | ORAL_TABLET | Freq: Once | ORAL | Status: AC
  Administered 2011-06-28: 1 via ORAL
  Filled 2011-06-28: qty 1

## 2011-06-28 NOTE — ED Notes (Signed)
C/O lower abd pain, pain with sex, small amt of blood after sex. Also states vomited x 1 yesterday and emesis had a blood clot in it. Seen here Tuesday for weakness, abd pain, chest pain, head tension.

## 2011-06-28 NOTE — ED Notes (Signed)
Patient is resting comfortably. 

## 2011-06-28 NOTE — Discharge Instructions (Signed)
REturn here as needed. Follow up with your doctor for a recheck. Increase your fluids.

## 2011-06-28 NOTE — ED Provider Notes (Signed)
History     CSN: [id number]  Arrival date & time 06/28/11  1202   First MD Initiated Contact with Patient 06/28/11 1241      Chief Complaint  Patient presents with  . Hematemesis    (Consider location/radiation/quality/duration/timing/severity/associated sxs/prior treatment) HPI The patient presents to the ER with one episode of vomiting in which there was some blood in the vomitus. This episode occurred yesterday. The patient states that she was seen in the ER several days ago for weakness and abd pain. The patient states that her abd pain is chronic. The patient states that the pain is worse around her cycles. The patient states that she buys vicodin from people for her chronic pain issues. The patient has no other complaints at this time other than the one episode of vomiting that has some streaks of blood. She states that she feels improved over when she was here earlier this week. No past medical history on file.  Past Surgical History  Procedure Date  . Hysteroscopy     No family history on file.  History  Substance Use Topics  . Smoking status: Never Smoker   . Smokeless tobacco: Not on file  . Alcohol Use: Yes     occasionally    OB History    Grav Para Term Preterm Abortions TAB SAB Ect Mult Living                  Review of Systems All other systems negative except as documented in the HPI. All pertinent positives and negatives as reviewed in the HPI.  Allergies  Latex  Home Medications   Current Outpatient Rx  Name Route Sig Dispense Refill  . HYDROCODONE-ACETAMINOPHEN 5-500 MG PO TABS Oral Take 1 tablet by mouth every 6 (six) hours as needed. Pain    . ONDANSETRON 8 MG PO TBDP Oral Take 1 tablet (8 mg total) by mouth every 8 (eight) hours as needed for nausea. 20 tablet 0  . RIFAMPIN 300 MG PO CAPS Oral Take 600 mg by mouth daily. Has to take for 4 months total has 1 month left ends July 2013    . TRAMADOL HCL 50 MG PO TABS Oral Take 1 tablet (50 mg  total) by mouth every 6 (six) hours as needed for pain. 15 tablet 0  . HYDROCODONE-ACETAMINOPHEN 5-325 MG PO TABS Oral Take 1 tablet by mouth every 6 (six) hours as needed for pain. 6 tablet 0    BP 132/79  Pulse 75  Temp 98.4 F (36.9 C) (Oral)  Resp 18  SpO2 99%  LMP 06/08/2011  Physical Exam  Nursing note and vitals reviewed. Constitutional: She is oriented to person, place, and time. She appears well-developed and well-nourished. No distress.  HENT:  Head: Normocephalic and atraumatic.  Eyes: Pupils are equal, round, and reactive to light.  Neck: Normal range of motion. Neck supple.  Cardiovascular: Normal rate, regular rhythm and normal heart sounds.  Exam reveals no gallop and no friction rub.   No murmur heard. Pulmonary/Chest: Effort normal and breath sounds normal. No respiratory distress.  Abdominal: Soft. Bowel sounds are normal. She exhibits no distension. There is no tenderness. There is no rebound.  Neurological: She is alert and oriented to person, place, and time.  Skin: Skin is warm and dry. No rash noted.    ED Course  Procedures (including critical care time)  Labs Reviewed  CBC - Abnormal; Notable for the following:    RDW 16.7 (*)  All other components within normal limits  DIFFERENTIAL - Abnormal; Notable for the following:    Neutrophils Relative 39 (*)     Lymphocytes Relative 48 (*)     All other components within normal limits  COMPREHENSIVE METABOLIC PANEL - Abnormal; Notable for the following:    Potassium 3.2 (*)     BUN 5 (*)     Albumin 3.2 (*)     All other components within normal limits  URINALYSIS, ROUTINE W REFLEX MICROSCOPIC - Abnormal; Notable for the following:    Color, Urine ORANGE (*)  BIOCHEMICALS MAY BE AFFECTED BY COLOR   Specific Gravity, Urine 1.034 (*)     Bilirubin Urine MODERATE (*)     Ketones, ur 15 (*)     Protein, ur 30 (*)     Nitrite POSITIVE (*)     Leukocytes, UA MODERATE (*)     All other components  within normal limits  URINE MICROSCOPIC-ADD ON - Abnormal; Notable for the following:    Squamous Epithelial / LPF FEW (*)     Bacteria, UA MANY (*)     All other components within normal limits  PREGNANCY, URINE     1. Vomiting   Patient has been completely stable here in the ER. She even noted she feels better over when she was here previously. She was concerned about the one episode of vomiting. The patient is advised to return here as needed. FOllow up with her PCP.     MDM   MDM Reviewed: nursing note, vitals and previous chart Reviewed previous: labs Interpretation: labs           Carlyle Dolly, PA-C 07/01/11 1531

## 2011-06-28 NOTE — Progress Notes (Signed)
Pt confirms pcp is [facility]

## 2011-06-28 NOTE — ED Notes (Signed)
Pt refusing IV 

## 2011-07-04 NOTE — ED Provider Notes (Signed)
Medical screening examination/treatment/procedure(s) were performed by non-physician practitioner and as supervising physician I was immediately available for consultation/collaboration.  Geoffery Lyons, MD 07/04/11 [phone]

## 2011-07-26 ENCOUNTER — Other Ambulatory Visit (HOSPITAL_COMMUNITY): Payer: Self-pay | Admitting: Family Medicine

## 2011-07-26 DIAGNOSIS — N83209 Unspecified ovarian cyst, unspecified side: Secondary | ICD-10-CM

## 2011-07-30 ENCOUNTER — Other Ambulatory Visit (HOSPITAL_COMMUNITY): Payer: Self-pay

## 2011-08-01 ENCOUNTER — Emergency Department (HOSPITAL_COMMUNITY)
Admission: EM | Admit: 2011-08-01 | Discharge: 2011-08-01 | Disposition: A | Discharge status: Home / Self Care | Payer: Self-pay | Attending: Emergency Medicine | Admitting: Emergency Medicine

## 2011-08-01 ENCOUNTER — Other Ambulatory Visit (HOSPITAL_COMMUNITY): Payer: Self-pay

## 2011-08-01 ENCOUNTER — Encounter (HOSPITAL_COMMUNITY): Payer: Self-pay | Admitting: Emergency Medicine

## 2011-08-01 ENCOUNTER — Emergency Department (HOSPITAL_COMMUNITY): Payer: Self-pay

## 2011-08-01 DIAGNOSIS — Y9383 Activity, rough housing and horseplay: Secondary | ICD-10-CM | POA: Insufficient documentation

## 2011-08-01 DIAGNOSIS — X58XXXA Exposure to other specified factors, initial encounter: Secondary | ICD-10-CM | POA: Insufficient documentation

## 2011-08-01 DIAGNOSIS — S63612A Unspecified sprain of right middle finger, initial encounter: Secondary | ICD-10-CM

## 2011-08-01 DIAGNOSIS — S6390XA Sprain of unspecified part of unspecified wrist and hand, initial encounter: Secondary | ICD-10-CM | POA: Insufficient documentation

## 2011-08-01 MED ORDER — ACETAMINOPHEN 500 MG PO TABS
1000.0000 mg | ORAL_TABLET | Freq: Once | ORAL | Status: AC
  Administered 2011-08-01: 1000 mg via ORAL
  Filled 2011-08-01: qty 2

## 2011-08-01 NOTE — ED Notes (Signed)
Pt st's she was playing and bent right middle finger backwards.   Pt c/o pain and swelling to finger
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
## 2011-08-08 ENCOUNTER — Inpatient Hospital Stay (HOSPITAL_COMMUNITY): Admission: RE | Admit: 2011-08-08 | Payer: Self-pay | Source: Ambulatory Visit

## 2011-08-08 ENCOUNTER — Other Ambulatory Visit (HOSPITAL_COMMUNITY): Payer: Self-pay

## 2011-08-15 ENCOUNTER — Ambulatory Visit (HOSPITAL_COMMUNITY)
Admission: RE | Admit: 2011-08-15 | Discharge: 2011-08-15 | Disposition: A | Discharge status: Home / Self Care | Payer: Self-pay | Source: Ambulatory Visit | Attending: Family Medicine | Admitting: Family Medicine

## 2011-08-15 DIAGNOSIS — N83209 Unspecified ovarian cyst, unspecified side: Secondary | ICD-10-CM | POA: Insufficient documentation

## 2011-09-12 ENCOUNTER — Inpatient Hospital Stay (HOSPITAL_COMMUNITY)
Admission: AD | Admit: 2011-09-12 | Discharge: 2011-09-12 | Disposition: A | Discharge status: Home / Self Care | Payer: Self-pay | Source: Ambulatory Visit | Attending: Obstetrics & Gynecology | Admitting: Obstetrics & Gynecology

## 2011-09-12 ENCOUNTER — Encounter (HOSPITAL_COMMUNITY): Payer: Self-pay | Admitting: *Deleted

## 2011-09-12 DIAGNOSIS — B3731 Acute candidiasis of vulva and vagina: Secondary | ICD-10-CM | POA: Insufficient documentation

## 2011-09-12 DIAGNOSIS — A499 Bacterial infection, unspecified: Secondary | ICD-10-CM | POA: Insufficient documentation

## 2011-09-12 DIAGNOSIS — N949 Unspecified condition associated with female genital organs and menstrual cycle: Secondary | ICD-10-CM | POA: Insufficient documentation

## 2011-09-12 DIAGNOSIS — N760 Acute vaginitis: Secondary | ICD-10-CM | POA: Insufficient documentation

## 2011-09-12 DIAGNOSIS — B373 Candidiasis of vulva and vagina: Secondary | ICD-10-CM

## 2011-09-12 DIAGNOSIS — B9689 Other specified bacterial agents as the cause of diseases classified elsewhere: Secondary | ICD-10-CM | POA: Insufficient documentation

## 2011-09-12 HISTORY — DX: Unspecified chronic bronchitis: J42

## 2011-09-12 LAB — URINALYSIS, ROUTINE W REFLEX MICROSCOPIC
Bilirubin Urine: NEGATIVE
Ketones, ur: NEGATIVE mg/dL
Leukocytes, UA: NEGATIVE
Nitrite: NEGATIVE
Urobilinogen, UA: 0.2 mg/dL (ref 0.0–1.0)

## 2011-09-12 LAB — URINE MICROSCOPIC-ADD ON

## 2011-09-12 MED ORDER — METRONIDAZOLE 500 MG PO TABS: 500.0000 mg | ORAL_TABLET | Freq: Two times a day (BID) | ORAL | Status: AC

## 2011-09-12 MED ORDER — FLUCONAZOLE 150 MG PO TABS: 150.0000 mg | ORAL_TABLET | Freq: Once | ORAL | Status: DC

## 2011-09-12 NOTE — MAU Note (Signed)
Pt states she noticed a discharge last week, also has pain on the left side.

## 2011-09-12 NOTE — MAU Note (Signed)
Patient states she has had a thick white vaginal discharge with an odor for about one week. Has some left sided pain off and on.
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
## 2011-09-13 LAB — GC/CHLAMYDIA PROBE AMP, GENITAL
Chlamydia, DNA Probe: NEGATIVE
GC Probe Amp, Genital: NEGATIVE

## 2011-10-29 ENCOUNTER — Emergency Department (INDEPENDENT_AMBULATORY_CARE_PROVIDER_SITE_OTHER)
Admission: EM | Admit: 2011-10-29 | Discharge: 2011-10-29 | Disposition: A | Discharge status: Home / Self Care | Payer: Self-pay | Source: Home / Self Care | Attending: Family Medicine | Admitting: Family Medicine

## 2011-10-29 ENCOUNTER — Encounter (HOSPITAL_COMMUNITY): Payer: Self-pay | Admitting: Emergency Medicine

## 2011-10-29 DIAGNOSIS — N898 Other specified noninflammatory disorders of vagina: Secondary | ICD-10-CM

## 2011-10-29 DIAGNOSIS — N760 Acute vaginitis: Secondary | ICD-10-CM

## 2011-10-29 DIAGNOSIS — A499 Bacterial infection, unspecified: Secondary | ICD-10-CM

## 2011-10-29 LAB — POCT PREGNANCY, URINE: Preg Test, Ur: NEGATIVE

## 2011-10-29 LAB — WET PREP, GENITAL

## 2011-10-29 LAB — POCT URINALYSIS DIP (DEVICE)
Glucose, UA: NEGATIVE mg/dL
Leukocytes, UA: NEGATIVE
Nitrite: NEGATIVE
Protein, ur: NEGATIVE mg/dL
Urobilinogen, UA: 0.2 mg/dL (ref 0.0–1.0)

## 2011-10-29 MED ORDER — ALBUTEROL SULFATE HFA 108 (90 BASE) MCG/ACT IN AERS: 2.0000 | INHALATION_SPRAY | RESPIRATORY_TRACT | Status: DC | PRN

## 2011-10-29 MED ORDER — METRONIDAZOLE 500 MG PO TABS: 500.0000 mg | ORAL_TABLET | Freq: Two times a day (BID) | ORAL | Status: DC

## 2011-10-29 MED ORDER — IBUPROFEN 800 MG PO TABS: 800.0000 mg | ORAL_TABLET | Freq: Three times a day (TID) | ORAL | Status: DC

## 2011-10-29 NOTE — ED Provider Notes (Signed)
History     CSN: [id number]  Arrival date & time 10/29/11  1308   First MD Initiated Contact with Patient 10/29/11 1059      Chief Complaint  Patient presents with  . Vaginal Discharge    (Consider location/radiation/quality/duration/timing/severity/associated sxs/prior treatment) Patient is a 31 y.o. female presenting with vaginal discharge. The history is provided by the patient.  Vaginal Discharge This is a new problem. The current episode started more than 1 week ago. The problem has not changed since onset.Pertinent negatives include no chest pain, no abdominal pain and no headaches. The symptoms are relieved by narcotics.   Pt is presenting with c/o vaginal discharge x 2 weeks. Described as whitish, creamy texture, foul odor. Denies pruritus.  Denies abnormal vaginal bleeding, + left pelvic pain, no fever. No UTI symptoms. Sexually active, does not use condoms, no BCM, no change in partner.  Last unprotected intercourse 30 days ago.  Denies history of known exposure to STD or symptoms in partner.  Patient's last menstrual period was 10/18/2011.  No history of STD's. Last Pap test was negative (June 2013). Underwent hystereoctomy.  Hx of ovarian cysts and fibroid.  Requests refill of albuterol for known hx of bronchitis and cough symptoms.  Past Medical History  Diagnosis Date  . Bronchitis, chronic     Past Surgical History  Procedure Date  . Hysteroscopy     Family History  Problem Relation Age of Onset  . Alcohol abuse Neg Hx   . Arthritis Neg Hx   . Asthma Neg Hx   . Birth defects Neg Hx   . Cancer Neg Hx   . COPD Neg Hx   . Depression Neg Hx   . Diabetes Neg Hx   . Drug abuse Neg Hx   . Early death Neg Hx   . Hearing loss Neg Hx   . Heart disease Neg Hx   . Hyperlipidemia Neg Hx   . Hypertension Neg Hx   . Kidney disease Neg Hx   . Learning disabilities Neg Hx   . Mental illness Neg Hx   . Mental retardation Neg Hx   . Miscarriages / Stillbirths Neg Hx    . Stroke Neg Hx   . Vision loss Neg Hx     History  Substance Use Topics  . Smoking status: Former Smoker    Quit date: 04/12/2011  . Smokeless tobacco: Not on file  . Alcohol Use: Yes     occasionally    OB History    Grav Para Term Preterm Abortions TAB SAB Ect Mult Living   0               Review of Systems  Constitutional: Negative.   Respiratory: Negative.   Cardiovascular: Negative.  Negative for chest pain.  Gastrointestinal: Negative.  Negative for abdominal pain.  Genitourinary: Positive for vaginal discharge and pelvic pain. Negative for urgency, frequency, hematuria, flank pain, decreased urine volume, vaginal bleeding, genital sores and vaginal pain.  Neurological: Negative for headaches.    Allergies  Latex  Home Medications   Current Outpatient Rx  Name Route Sig Dispense Refill  . ALBUTEROL SULFATE HFA 108 (90 BASE) MCG/ACT IN AERS Inhalation Inhale 2 puffs into the lungs every 4 (four) hours as needed for wheezing. 1 Inhaler 2  . IBUPROFEN 800 MG PO TABS Oral Take 1 tablet (800 mg total) by mouth 3 (three) times daily. 30 tablet 2  . METRONIDAZOLE 500 MG PO TABS Oral Take 1  tablet (500 mg total) by mouth 2 (two) times daily. 14 tablet 0    BP 117/79  Pulse 72  Temp 98.3 F (36.8 C) (Oral)  Resp 18  SpO2 100%  LMP 10/18/2011  Physical Exam  Nursing note and vitals reviewed. Constitutional: She is oriented to person, place, and time. Vital signs are normal. She appears well-developed and well-nourished. She is active and cooperative.  HENT:  Head: Normocephalic.  Eyes: Conjunctivae normal are normal. Pupils are equal, round, and reactive to light. No scleral icterus.  Neck: Trachea normal. Neck supple.  Cardiovascular: Normal rate, regular rhythm, normal heart sounds and normal pulses.   Pulmonary/Chest: Effort normal. She has wheezes in the right lower field and the left lower field. She exhibits no tenderness and no crepitus.       Wheezing  to bilateral posterior lobe  Abdominal: Soft. Bowel sounds are normal. There is tenderness in the suprapubic area and left lower quadrant. There is no rigidity, no guarding and no CVA tenderness. Hernia confirmed negative in the right inguinal area and confirmed negative in the left inguinal area.    Genitourinary: Uterus normal. Vaginal discharge found.  Musculoskeletal: Normal range of motion.  Lymphadenopathy:    She has no axillary adenopathy.  Neurological: She is alert and oriented to person, place, and time. She has normal reflexes. No sensory deficit.  Skin: Skin is warm, dry and intact.  Psychiatric: She has a normal mood and affect. Her speech is normal and behavior is normal. Judgment and thought content normal. Cognition and memory are normal.    ED Course  Procedures (including critical care time)  Labs Reviewed  WET PREP, GENITAL - Abnormal; Notable for the following:    Clue Cells Wet Prep HPF POC MANY (*)     WBC, Wet Prep HPF POC FEW (*)     All other components within normal limits  POCT URINALYSIS DIP (DEVICE)  POCT PREGNANCY, URINE  GC/CHLAMYDIA PROBE AMP, GENITAL  LAB REPORT - SCANNED   No results found.   1. Bacterial vaginosis   2. Vaginal discharge       MDM  Await wet prep, gc/ct results.  Condoms for sti prevention.  Consider contraception to decrease pain associated with ovarian cysts.  Follow up with pcp or gyn provider as needed.         Johnsie Kindred, NP 10/31/11 1528

## 2011-10-29 NOTE — ED Notes (Signed)
Pt c/o vaginal discharge x1 week.... Sx include: white substance discharge, abd pain/pressure... Denies: spotting, urinary problems, fevers, vomiting, nausea, diarrhea.... Pt is alert w/no signs of distress... Was going to [facility] but due to their closure, has not been able to see a doctor.

## 2011-10-30 ENCOUNTER — Telehealth (HOSPITAL_COMMUNITY): Payer: Self-pay | Admitting: *Deleted

## 2011-10-30 NOTE — ED Notes (Signed)
Pt. Called on VM.  I called pt. Back and she said she needed her Rx. For her Albuteral inhaler sent to the [facility] pharmacy. She said it was sent to [facility] but she can't afford it there. She found out she can't get it at the [facility] without renewing her orange card.  She has an appointment 11/1 to do that. She should be able to transfer it there after that.   Vassie Moselle 10/30/2011

## 2011-10-30 NOTE — ED Notes (Signed)
GC/Chlamydia neg., Wet prep: Many clue cells, few WBC's.  Pt. adequately treated with Flagyl. Vassie Moselle 10/30/2011

## 2011-10-31 MED ORDER — ALBUTEROL SULFATE HFA 108 (90 BASE) MCG/ACT IN AERS: 2.0000 | INHALATION_SPRAY | RESPIRATORY_TRACT | Status: DC | PRN

## 2011-11-02 NOTE — ED Provider Notes (Signed)
Medical screening examination/treatment/procedure(s) were performed by resident physician or non-physician practitioner and as supervising physician I was immediately available for consultation/collaboration.   KINDL,JAMES DOUGLAS MD.    James D Kindl, MD 11/02/11 1434 

## 2011-11-26 ENCOUNTER — Encounter: Payer: Self-pay | Admitting: Family Medicine

## 2011-11-26 ENCOUNTER — Other Ambulatory Visit: Payer: Self-pay | Admitting: Family Medicine

## 2011-11-26 ENCOUNTER — Telehealth: Payer: Self-pay | Admitting: Family Medicine

## 2011-11-26 ENCOUNTER — Ambulatory Visit (INDEPENDENT_AMBULATORY_CARE_PROVIDER_SITE_OTHER): Payer: Self-pay | Admitting: Family Medicine

## 2011-11-26 VITALS — BP 107/75 | HR 66 | Temp 98.5°F | Ht 66.0 in | Wt 284.0 lb

## 2011-11-26 DIAGNOSIS — D259 Leiomyoma of uterus, unspecified: Secondary | ICD-10-CM

## 2011-11-26 DIAGNOSIS — F329 Major depressive disorder, single episode, unspecified: Secondary | ICD-10-CM

## 2011-11-26 DIAGNOSIS — D219 Benign neoplasm of connective and other soft tissue, unspecified: Secondary | ICD-10-CM | POA: Insufficient documentation

## 2011-11-26 MED ORDER — DICLOFENAC SODIUM 75 MG PO TBEC: 75.0000 mg | DELAYED_RELEASE_TABLET | Freq: Two times a day (BID) | ORAL | Status: DC

## 2011-11-26 NOTE — Patient Instructions (Signed)
It was nice to meet you today.  I have sent in the prescription for the antiinflammatory to the pharmacy for you. You can take it twice a day as needed. Please come back to see me in 3-4 months or as needed.  Jakylan Ron M. Medora Roorda, M.D.   Fibroids Fibroids are lumps (tumors) that can occur any place in a woman's body. These lumps are not cancerous. Fibroids vary in size, weight, and where they grow. HOME CARE  Do not take aspirin.  Write down the number of pads or tampons you use during your period. Tell your doctor. This can help determine the best treatment for you. GET HELP RIGHT AWAY IF:  You have pain in your lower belly (abdomen) that is not helped with medicine.  You have cramps that are not helped with medicine.  You have more bleeding between or during your period.  You feel lightheaded or pass out (faint).  Your lower belly pain gets worse. MAKE SURE YOU:  Understand these instructions.  Will watch your condition.  Will get help right away if you are not doing well or get worse. Document Released: 01/26/2010 Document Revised: 03/18/2011 Document Reviewed: 01/26/2010 [facility] Patient Information 2013 [place], [place].

## 2011-11-26 NOTE — Assessment & Plan Note (Signed)
Patient followed by Carla Hodges. Unsure of her antidepressant, but will follow up with [facility]. No SI/HI.

## 2011-11-26 NOTE — Telephone Encounter (Signed)
Patient is calling asking for the Rx that was written today to be transferred to [facility] on [address] and [address].  She would like a call when this has been done.

## 2011-11-26 NOTE — Assessment & Plan Note (Signed)
Seen on u/s in August. Bleeding 3-5 days every 28 days. No s/sx of anemia. Discussed with patient that I do not routinely prescribed narcotics, especially for menstrual pain. Will give Diclofenac (on $4 [employer] list) to take as needed BID for cramping. She agrees with this. RTC in 3 months to re-evaluate pain. If she has orange card, can consider referral to gyn.

## 2011-11-26 NOTE — Progress Notes (Signed)
Patient ID: Carla Hodges, female   DOB: 09-17-1980, 31 y.o.   MRN: [id number] [facility] Elvin Banker M. Sevyn Markham, MD Phone: [phone]   Subjective: HPI: Patient is a 31 y.o. female presenting to clinic today for new patient appointment. Previously seen by [facility].  Concerns today include Fibroids  1. Fibroids- LMP 11/7-11/11. Comes regularly. Painful. Takes grandmother's hydrocodone. IBU does not help. Normally 3 days heavy bleeding, goes through 14 super heavy pads in one period. Had ultrasound in August that showed fibroids. Last GYN appointment was a long time ago with Dr. Normand Sloop when she had insurance. Was on OCP a long time ago and unsure if that helped.   2. Mental health- Patient states she was seen at [facility] one month ago. Given Rx for antidepressant and Trazodone. She states she has not taken either one. Unsure of antidepressant's name.  She states she went to [place] because she "hates people." She also sees a therapist about her life and relationships. She will follow up with [facility] on December 3. She denies SI/HI.  Past Medical History  Diagnosis Date  . Bronchitis, chronic   . Fibroids    Past Surgical History  Procedure Date  . Hysteroscopy     2000   History   Social History  . Marital Status: Single    Spouse Name: N/A    Number of Children: N/A  . Years of Education: N/A   Occupational History  . Not on file.   Social History Main Topics  . Smoking status: Former Smoker    Quit date: 04/12/2011  . Smokeless tobacco: Not on file  . Alcohol Use: Yes     Comment: cut back and now drinks on weekends  . Drug Use: No  . Sexually Active: Yes    Birth Control/ Protection: None   Other Topics Concern  . Not on file   Social History Narrative   Lives with grandmother in [place]. 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    History Reviewed: Former marijuana smoker. Health Maintenance:   Flu- unsure  Tdap-  unsure  Pap- May 2013, no abnormal pap   ROS: Please see HPI above. Otherwise, negative.  Objective: Office vital signs reviewed.  Physical Examination:  General: Awake, alert. NAD HEENT: Atraumatic, normocephalic Pulm: CTAB, no wheezes Cardio: RRR, no murmurs appreciated Abdomen: Obese, soft, nontender, nondistended Extremities: No edema Neuro: Grossly intact Psych: Well dressed, makes eye contact but also using phone during visit. Somewhat flat affect  Assessment: 31 yo New patient appointment  Plan: See Problem List and After Visit Summary

## 2011-11-26 NOTE — Telephone Encounter (Signed)
Sent to [facility]. Patient can pick up whenever she would like.  Thank you! Jillianna Stanek M. Dylann Layne, M.D.

## 2011-11-27 NOTE — Telephone Encounter (Signed)
Pt informed. Fleeger, Jessica Dawn  

## 2011-12-02 ENCOUNTER — Ambulatory Visit (INDEPENDENT_AMBULATORY_CARE_PROVIDER_SITE_OTHER): Payer: Self-pay | Admitting: Emergency Medicine

## 2011-12-02 ENCOUNTER — Telehealth: Payer: Self-pay | Admitting: Family Medicine

## 2011-12-02 ENCOUNTER — Encounter: Payer: Self-pay | Admitting: Emergency Medicine

## 2011-12-02 VITALS — BP 130/80 | HR 67 | Ht 66.0 in | Wt 284.0 lb

## 2011-12-02 DIAGNOSIS — N898 Other specified noninflammatory disorders of vagina: Secondary | ICD-10-CM

## 2011-12-02 DIAGNOSIS — A5901 Trichomonal vulvovaginitis: Secondary | ICD-10-CM | POA: Insufficient documentation

## 2011-12-02 LAB — POCT WET PREP (WET MOUNT): Clue Cells Wet Prep Whiff POC: POSITIVE

## 2011-12-02 MED ORDER — METRONIDAZOLE 500 MG PO TABS: 500.0000 mg | ORAL_TABLET | Freq: Two times a day (BID) | ORAL | Status: DC

## 2011-12-02 MED ORDER — FLUCONAZOLE 150 MG PO TABS: 150.0000 mg | ORAL_TABLET | Freq: Once | ORAL | Status: DC

## 2011-12-02 NOTE — Progress Notes (Signed)
  Subjective:    Patient ID: Pincus Badder, female    DOB: 1980/09/12, 31 y.o.   MRN: [id number]  HPI Shir A Mathena is here for SDA for vaginal discharge.  Reports thick creamy white discharge with some clumps since Friday.  This developed after unprotected sex with her partner.  No odor or vaginal itching.  No new sexual partners.  Not on birth control, but wants to get pregnant.  Completed course of flagyl about 1 month ago.  Denies nausea, vomiting, fevers, abdominal pain.  I have reviewed and updated the following as appropriate: allergies and current medications PMHx: h/o fibroids SHx: former smoker  Review of Systems See HPI    Objective:   Physical Exam BP 130/80  Pulse 67  Ht 5\' 6"  (1.676 m)  Wt 284 lb (128.822 kg)  BMI 45.84 kg/m2  LMP 11/14/2011 Gen: alert, cooperative CV: rRR, no murmurs Pulm: CTAB, no wheezes or rales Abd: +BS, soft, ND, very minimal tenderness in suprapubic region      Assessment & Plan:

## 2011-12-02 NOTE — Patient Instructions (Addendum)
You have Trichomonas and BV.   Please take Flagyl 500mg  twice a day for 7 days. Once you have finished the Flagyl, take Diflucan 150mg  once.  You can repeat the Diflucan dose in 1 week, if you still have symptoms. It is very important that your partner get tested and treated for trichomonas.  Until this happens, please do not have sex or use a condom.

## 2011-12-02 NOTE — Assessment & Plan Note (Signed)
Many trich on wet prep with some clue cells as well.  Will treat with flagyl 500mg  BID x7 days.  Also provided prescription for diflucan to take after antibiotics.  Discussed extensively that partner needs to be treated as well or this will become a recurrent problem.

## 2011-12-02 NOTE — Telephone Encounter (Signed)
Error.Carla Hodges Lynetta  

## 2012-01-06 ENCOUNTER — Encounter: Payer: Self-pay | Admitting: Family Medicine

## 2012-01-06 ENCOUNTER — Other Ambulatory Visit (HOSPITAL_COMMUNITY)
Admission: RE | Admit: 2012-01-06 | Discharge: 2012-01-06 | Disposition: A | Discharge status: Home / Self Care | Payer: Self-pay | Source: Ambulatory Visit | Attending: Family Medicine | Admitting: Family Medicine

## 2012-01-06 ENCOUNTER — Ambulatory Visit (INDEPENDENT_AMBULATORY_CARE_PROVIDER_SITE_OTHER): Payer: Self-pay | Admitting: Family Medicine

## 2012-01-06 VITALS — BP 115/75 | HR 80 | Temp 98.3°F | Ht 66.0 in | Wt 284.0 lb

## 2012-01-06 DIAGNOSIS — Z113 Encounter for screening for infections with a predominantly sexual mode of transmission: Secondary | ICD-10-CM | POA: Insufficient documentation

## 2012-01-06 DIAGNOSIS — N760 Acute vaginitis: Secondary | ICD-10-CM | POA: Insufficient documentation

## 2012-01-06 LAB — POCT WET PREP (WET MOUNT): Clue Cells Wet Prep Whiff POC: POSITIVE

## 2012-01-06 MED ORDER — FLUCONAZOLE 150 MG PO TABS: 150.0000 mg | ORAL_TABLET | Freq: Once | ORAL | Status: DC

## 2012-01-06 MED ORDER — METRONIDAZOLE 500 MG PO TABS: 500.0000 mg | ORAL_TABLET | Freq: Two times a day (BID) | ORAL | Status: DC

## 2012-01-06 NOTE — Progress Notes (Signed)
Patient ID: Carla Hodges, female   DOB: 1980/06/12, 31 y.o.   MRN: [id number]     31 y.o. female complains of scant clear and white vaginal discharge for several days.  Previously treated for Trichomonas but unsure if partner treated, he tells her he picked up medication but she does not trust him.  States this discharge is nothing like prior when she had Trich.  No burning or itching. Denies abnormal vaginal bleeding, significant pelvic pain or fever. No UTI symptoms. Sexually active, does not use condoms, no change in partner.  Last unprotected intercourse _3-4_ days ago.  Denies history of known exposure to STD or symptoms in partner.  LMP was 2 weeks ago.  No abdominal pain, nausea, or vomiting.    Review of Systems  See HPI above for review of systems.    Objective:   Physical Exam  BP 115/75  Pulse 80  Temp 98.3 F (36.8 C) (Oral)  Ht 5\' 6"  (1.676 m)  Wt 284 lb (128.822 kg)  BMI 45.84 kg/m2 Gen:  She appears well, afebrile.  Abdomen: benign, soft, nontender, no masses.  GYN:  External genitalia within normal limits.  Vaginal mucosa pink, moist, normal rugae.  Nonfriable cervix without lesions, minimal white discharge without bleeding noted on speculum exam.

## 2012-01-06 NOTE — Patient Instructions (Addendum)
Take the Diflucan 1 pill for yeast.  Take the Metronidazole for 5 days for BV.    Have a good New Year!

## 2012-01-06 NOTE — Assessment & Plan Note (Signed)
Wet prep revealed BV and yeast. Diflucan and Flagyl to treat. Discussed what causes BV and answered patient's questions.

## 2012-01-29 ENCOUNTER — Encounter: Payer: Self-pay | Admitting: Family Medicine

## 2012-01-29 ENCOUNTER — Other Ambulatory Visit (HOSPITAL_COMMUNITY)
Admission: RE | Admit: 2012-01-29 | Discharge: 2012-01-29 | Disposition: A | Discharge status: Home / Self Care | Payer: Self-pay | Source: Ambulatory Visit | Attending: Family Medicine | Admitting: Family Medicine

## 2012-01-29 ENCOUNTER — Ambulatory Visit (INDEPENDENT_AMBULATORY_CARE_PROVIDER_SITE_OTHER): Payer: Self-pay | Admitting: Family Medicine

## 2012-01-29 VITALS — BP 101/74 | HR 78 | Ht 65.5 in | Wt 277.0 lb

## 2012-01-29 DIAGNOSIS — N760 Acute vaginitis: Secondary | ICD-10-CM

## 2012-01-29 DIAGNOSIS — Z113 Encounter for screening for infections with a predominantly sexual mode of transmission: Secondary | ICD-10-CM | POA: Insufficient documentation

## 2012-01-29 LAB — POCT WET PREP (WET MOUNT)

## 2012-01-29 MED ORDER — METRONIDAZOLE 500 MG PO TABS: 500.0000 mg | ORAL_TABLET | Freq: Two times a day (BID) | ORAL | Status: DC

## 2012-01-29 NOTE — Patient Instructions (Signed)
Take the Flagyl/Metronidazole twice a day (once in AM and once in PM) for next 10 days.  After that, take 1 pill a week for the next 8 weeks.   Schedule this the same day each week.

## 2012-01-29 NOTE — Assessment & Plan Note (Signed)
Wet prep revealed BV. Discussed genital hygiene with patient. She has had almost monthly recurrence of BV for past 3-4 months, each time several days after having sex with same partner.   Treat with prolonged course of Flagyl BID dosing for 10 days. Then attempt weekly 1 daily 500 mg Flagyl to prevent recurrence.  8 week course. FU prn.

## 2012-01-29 NOTE — Progress Notes (Signed)
Patient ID: Carla Hodges, female   DOB: [birth year], 32 y.o.   MRN: [id number]      32 y.o. female complains of white, copious and malodorous vaginal discharge for 5 day(s). Denies abnormal vaginal bleeding, significant pelvic pain or fever. No UTI symptoms. Sexually active, does not use condoms, no change in partner.  Last unprotected intercourse _3_ days ago.  Denies history of known exposure to STD or symptoms in partner.    No LMP recorded.  History of Trich Nov 2013 but no other STDs.    No abdominal pain, nausea, or vomiting.  No fevers or chills.    Review of Systems  See HPI above for review of systems.    Objective:   Physical Exam  BP 101/74  Pulse 78  Ht 5' 5.5" (1.664 m)  Wt 277 lb (125.646 kg)  BMI 45.39 kg/m2 Gen:  She appears well, afebrile.  Abdomen: benign, soft, nontender, no masses.  GYN:  External genitalia within normal limits.  Vaginal mucosa pink, moist, normal rugae.  Nonfriable cervix without lesions, white discharge without bleeding noted on speculum exam.

## 2012-03-25 ENCOUNTER — Encounter (HOSPITAL_COMMUNITY): Payer: Self-pay | Admitting: Emergency Medicine

## 2012-03-25 ENCOUNTER — Emergency Department (HOSPITAL_COMMUNITY)
Admission: EM | Admit: 2012-03-25 | Discharge: 2012-03-25 | Disposition: A | Discharge status: Home / Self Care | Payer: No Typology Code available for payment source | Attending: Emergency Medicine | Admitting: Emergency Medicine

## 2012-03-25 DIAGNOSIS — Z792 Long term (current) use of antibiotics: Secondary | ICD-10-CM | POA: Insufficient documentation

## 2012-03-25 DIAGNOSIS — R42 Dizziness and giddiness: Secondary | ICD-10-CM | POA: Insufficient documentation

## 2012-03-25 DIAGNOSIS — R112 Nausea with vomiting, unspecified: Secondary | ICD-10-CM | POA: Insufficient documentation

## 2012-03-25 DIAGNOSIS — Z3202 Encounter for pregnancy test, result negative: Secondary | ICD-10-CM | POA: Insufficient documentation

## 2012-03-25 DIAGNOSIS — Z79899 Other long term (current) drug therapy: Secondary | ICD-10-CM | POA: Insufficient documentation

## 2012-03-25 DIAGNOSIS — Z87891 Personal history of nicotine dependence: Secondary | ICD-10-CM | POA: Insufficient documentation

## 2012-03-25 DIAGNOSIS — R51 Headache: Secondary | ICD-10-CM | POA: Insufficient documentation

## 2012-03-25 DIAGNOSIS — J029 Acute pharyngitis, unspecified: Secondary | ICD-10-CM | POA: Insufficient documentation

## 2012-03-25 DIAGNOSIS — D259 Leiomyoma of uterus, unspecified: Secondary | ICD-10-CM | POA: Insufficient documentation

## 2012-03-25 DIAGNOSIS — J42 Unspecified chronic bronchitis: Secondary | ICD-10-CM | POA: Insufficient documentation

## 2012-03-25 DIAGNOSIS — R109 Unspecified abdominal pain: Secondary | ICD-10-CM | POA: Insufficient documentation

## 2012-03-25 DIAGNOSIS — M79609 Pain in unspecified limb: Secondary | ICD-10-CM | POA: Insufficient documentation

## 2012-03-25 LAB — CBC WITH DIFFERENTIAL/PLATELET
Basophils Relative: 0 % (ref 0–1)
HCT: 39.5 % (ref 36.0–46.0)
Hemoglobin: 12.8 g/dL (ref 12.0–15.0)
Lymphocytes Relative: 29 % (ref 12–46)
MCHC: 32.4 g/dL (ref 30.0–36.0)
Monocytes Absolute: 0.7 10*3/uL (ref 0.1–1.0)
Monocytes Relative: 9 % (ref 3–12)
Neutro Abs: 5.2 10*3/uL (ref 1.7–7.7)

## 2012-03-25 LAB — URINALYSIS, MICROSCOPIC ONLY
Leukocytes, UA: NEGATIVE
Protein, ur: NEGATIVE mg/dL
Urobilinogen, UA: 0.2 mg/dL (ref 0.0–1.0)

## 2012-03-25 LAB — COMPREHENSIVE METABOLIC PANEL
BUN: 8 mg/dL (ref 6–23)
CO2: 24 mEq/L (ref 19–32)
Chloride: 104 mEq/L (ref 96–112)
Creatinine, Ser: 0.72 mg/dL (ref 0.50–1.10)
GFR calc non Af Amer: >90 mL/min (ref >90)
Total Bilirubin: 0.5 mg/dL (ref 0.3–1.2)

## 2012-03-25 LAB — POCT PREGNANCY, URINE: Preg Test, Ur: NEGATIVE

## 2012-03-25 MED ORDER — KETOROLAC TROMETHAMINE 30 MG/ML IJ SOLN
30.0000 mg | Freq: Once | INTRAMUSCULAR | Status: AC
  Administered 2012-03-25: 30 mg via INTRAVENOUS
  Filled 2012-03-25: qty 1

## 2012-03-25 MED ORDER — SODIUM CHLORIDE 0.9 % IV SOLN
Freq: Once | INTRAVENOUS | Status: AC
  Administered 2012-03-25: 14:00:00 via INTRAVENOUS

## 2012-03-25 MED ORDER — MECLIZINE HCL 25 MG PO TABS
25.0000 mg | ORAL_TABLET | Freq: Once | ORAL | Status: AC
  Administered 2012-03-25: 25 mg via ORAL
  Filled 2012-03-25: qty 1

## 2012-03-25 MED ORDER — MECLIZINE HCL 25 MG PO TABS: 25.0000 mg | ORAL_TABLET | Freq: Three times a day (TID) | ORAL | Status: DC | PRN

## 2012-03-25 MED ORDER — PROMETHAZINE HCL 12.5 MG PO TABS: 12.5000 mg | ORAL_TABLET | Freq: Four times a day (QID) | ORAL | Status: DC | PRN

## 2012-03-25 MED ORDER — ONDANSETRON HCL 4 MG/2ML IJ SOLN
4.0000 mg | Freq: Once | INTRAMUSCULAR | Status: AC
  Administered 2012-03-25: 4 mg via INTRAVENOUS
  Filled 2012-03-25: qty 2

## 2012-03-25 NOTE — ED Notes (Addendum)
Pt given 2 packs of crackers and a Ginger Ale, per PA.  Pt asked for more crackers and this RN advised that we would see if she can keep down the first 2 packs, since she has been c/o nausea and dizziness.   Pt, upon administering Toradol, asked why she isn't getting Morphine or Dilaudid.  Informed Pt that she is not in severe enough pain.

## 2012-03-25 NOTE — ED Notes (Signed)
Pt escorted to discharge window. Verbalized understanding discharge instructions. In no acute distress.   

## 2012-03-25 NOTE — ED Notes (Signed)
States that she began vomiting this am around 0900. States that when she walked to the restroom she noticed that she was dizzy but when back to sleep. Denies headache or blurred vision.

## 2012-03-25 NOTE — ED Provider Notes (Signed)
Medical screening examination/treatment/procedure(s) were performed by non-physician practitioner and as supervising physician I was immediately available for consultation/collaboration.  Hamzah Savoca R. Dquan Cortopassi, MD 03/25/12 2351 

## 2012-03-25 NOTE — ED Provider Notes (Signed)
History     CSN: [id number]  Arrival date & time 03/25/12  1333   First MD Initiated Contact with Patient 03/25/12 1416      Chief Complaint  Patient presents with  . Dizziness  . Emesis    (Consider location/radiation/quality/duration/timing/severity/associated sxs/prior treatment) HPI Carla Hodges is a 32 y.o. female who presents to ED with complaint of dizziness over night, nausea and vomiting, pelvic abdominal pain this morning. States she did have some sore throat yesterday, states took tylenol for pain. States took muscle relaxant for pain in right arm that she has had for several weeks, and states took a pain pill, as well as sleeping mediation. State went to bed, in the middle of the night when woke up was dizzy, states felt like room was spinning. Went back to bed, work up this morning with nausea, and vomiting. Pt states having some suprapubic pain. No flank pain. No diarrhea. Did not take any medications this morning.    Past Medical History  Diagnosis Date  . Bronchitis, chronic   . Fibroids     Past Surgical History  Procedure Laterality Date  . Hysteroscopy      2000    Family History  Problem Relation Age of Onset  . Alcohol abuse Neg Hx   . Arthritis Neg Hx   . Asthma Neg Hx   . Birth defects Neg Hx   . Cancer Neg Hx   . COPD Neg Hx   . Depression Neg Hx   . Diabetes Neg Hx     Pt states family is "prone to DM"  . Drug abuse Neg Hx   . Early death Neg Hx   . Hearing loss Neg Hx   . Heart disease Neg Hx   . Hyperlipidemia Neg Hx   . Hypertension Neg Hx   . Kidney disease Neg Hx   . Learning disabilities Neg Hx   . Mental illness Neg Hx   . Mental retardation Neg Hx   . Miscarriages / Stillbirths Neg Hx   . Stroke Neg Hx   . Vision loss Neg Hx     History  Substance Use Topics  . Smoking status: Former Smoker    Quit date: 04/12/2011  . Smokeless tobacco: Not on file  . Alcohol Use: Yes     Comment: cut back and now drinks on weekends     OB History   Grav Para Term Preterm Abortions TAB SAB Ect Mult Living   0               Review of Systems  Constitutional: Negative for fever and chills.  HENT: Positive for sore throat. Negative for ear pain, congestion, neck pain and neck stiffness.   Respiratory: Negative.   Cardiovascular: Negative.   Gastrointestinal: Positive for nausea, vomiting and abdominal pain. Negative for diarrhea and constipation.  Genitourinary: Positive for pelvic pain. Negative for dysuria, flank pain, vaginal bleeding, vaginal discharge and vaginal pain.  Neurological: Positive for dizziness, light-headedness and headaches. Negative for syncope and weakness.  All other systems reviewed and are negative.    Allergies  Latex  Home Medications   Current Outpatient Rx  Name  Route  Sig  Dispense  Refill  . albuterol (PROVENTIL HFA;VENTOLIN HFA) 108 (90 BASE) MCG/ACT inhaler   Inhalation   Inhale 2 puffs into the lungs every 4 (four) hours as needed for wheezing.   1 Inhaler   2   . diclofenac (VOLTAREN) 75 MG EC  tablet   Oral   Take 75 mg by mouth 2 (two) times daily as needed (for pain.).         Marland Kitchen FLUoxetine (PROZAC) 10 MG capsule   Oral   Take 20 mg by mouth daily.         . metroNIDAZOLE (FLAGYL) 500 MG tablet   Oral   Take 500 mg by mouth once a week. Taken on Saturdays.         . traZODone (DESYREL) 50 MG tablet   Oral   Take 50 mg by mouth at bedtime. From [facility]           BP 102/75  Pulse 68  Temp(Src) 98.6 F (37 C) (Oral)  Resp 20  SpO2 100%  LMP 03/11/2012  Physical Exam  Nursing note and vitals reviewed. Constitutional: She is oriented to person, place, and time. She appears well-developed and well-nourished. No distress.  HENT:  Head: Normocephalic and atraumatic.  Right Ear: External ear normal.  Left Ear: External ear normal.  Nose: Nose normal.  Mouth/Throat: Oropharynx is clear and moist. No oropharyngeal exudate.  Eyes: Conjunctivae are  normal. Pupils are equal, round, and reactive to light.  Neck: Normal range of motion. Neck supple.  Cardiovascular: Normal rate, regular rhythm and normal heart sounds.   Pulmonary/Chest: Effort normal and breath sounds normal. No respiratory distress. She has no wheezes. She has no rales.  Abdominal: Soft. Bowel sounds are normal. She exhibits no distension. There is no tenderness. There is no rebound.  Genitourinary: Vagina normal.  Normal external genitalia. White thin discharge. Cervix closed. Normal. No CMT tenderness. No adnexal tenderness. No uterine tenderness.  Musculoskeletal: She exhibits no edema.  Normal appearing right upper arm and shoulder with no swelling, no signs of infection  Lymphadenopathy:    She has no cervical adenopathy.  Neurological: She is alert and oriented to person, place, and time. No cranial nerve deficit. Coordination normal.  Skin: Skin is warm and dry.    ED Course  Procedures (including critical care time)  Results for orders placed during the hospital encounter of 03/25/12  WET PREP, GENITAL      Result Value Range   Yeast Wet Prep HPF POC NONE SEEN  NONE SEEN   Trich, Wet Prep NONE SEEN  NONE SEEN   Clue Cells Wet Prep HPF POC NONE SEEN  NONE SEEN   WBC, Wet Prep HPF POC FEW (*) NONE SEEN  CBC WITH DIFFERENTIAL      Result Value Range   WBC 8.6  4.0 - 10.5 K/uL   RBC 4.40  3.87 - 5.11 MIL/uL   Hemoglobin 12.8  12.0 - 15.0 g/dL   HCT 95.6  21.3 - 08.6 %   MCV 89.8  78.0 - 100.0 fL   MCH 29.1  26.0 - 34.0 pg   MCHC 32.4  30.0 - 36.0 g/dL   RDW 57.8  46.9 - 62.9 %   Platelets 328  150 - 400 K/uL   Neutrophils Relative 61  43 - 77 %   Neutro Abs 5.2  1.7 - 7.7 K/uL   Lymphocytes Relative 29  12 - 46 %   Lymphs Abs 2.5  0.7 - 4.0 K/uL   Monocytes Relative 9  3 - 12 %   Monocytes Absolute 0.7  0.1 - 1.0 K/uL   Eosinophils Relative 1  0 - 5 %   Eosinophils Absolute 0.1  0.0 - 0.7 K/uL   Basophils Relative 0  0 - 1 %   Basophils Absolute  0.0  0.0 - 0.1 K/uL  COMPREHENSIVE METABOLIC PANEL      Result Value Range   Sodium 138  135 - 145 mEq/L   Potassium 3.8  3.5 - 5.1 mEq/L   Chloride 104  96 - 112 mEq/L   CO2 24  19 - 32 mEq/L   Glucose, Bld 87  70 - 99 mg/dL   BUN 8  6 - 23 mg/dL   Creatinine, Ser 2.44  0.50 - 1.10 mg/dL   Calcium 9.0  8.4 - 01.0 mg/dL   Total Protein 6.9  6.0 - 8.3 g/dL   Albumin 3.2 (*) 3.5 - 5.2 g/dL   AST 16  0 - 37 U/L   ALT 14  0 - 35 U/L   Alkaline Phosphatase 78  39 - 117 U/L   Total Bilirubin 0.5  0.3 - 1.2 mg/dL   GFR calc non Af Amer >90  >90 mL/min   GFR calc Af Amer >90  >90 mL/min  URINALYSIS, MICROSCOPIC ONLY      Result Value Range   Color, Urine YELLOW  YELLOW   APPearance CLEAR  CLEAR   Specific Gravity, Urine 1.013  1.005 - 1.030   pH 7.5  5.0 - 8.0   Glucose, UA NEGATIVE  NEGATIVE mg/dL   Hgb urine dipstick NEGATIVE  NEGATIVE   Bilirubin Urine NEGATIVE  NEGATIVE   Ketones, ur NEGATIVE  NEGATIVE mg/dL   Protein, ur NEGATIVE  NEGATIVE mg/dL   Urobilinogen, UA 0.2  0.0 - 1.0 mg/dL   Nitrite NEGATIVE  NEGATIVE   Leukocytes, UA NEGATIVE  NEGATIVE   Squamous Epithelial / LPF RARE  RARE  POCT PREGNANCY, URINE      Result Value Range   Preg Test, Ur NEGATIVE  NEGATIVE   No results found.   No results found.   1. Dizziness   2. Nausea & vomiting   3. Abdominal cramping   4. Arm pain, right       MDM  Pt with dizziness, nausea, vomiting, lower abdominal cramping. States that thinks maybe over medicated herself last night, has not taken this much medications at once before. Pt's labs here are normal. Vs normal. She is in no distress. She is actually sitting in stretcher, eating crackers and peanut butter and drinking fluids. Pt has hx of uterine fibroids, and statse pain feels the same. Her pelvic exam unremarkable dobut PID. No adnexal pain doubt torsion. Her dizziness improved with meclizine. Pt is stable for d/c at this time with close follow up.   Filed Vitals:    03/25/12 1337  BP: 102/75  Pulse: 68  Temp: 98.6 F (37 C)  TempSrc: Oral  Resp: 20  SpO2: 100%           Hernan Turnage A Sid Greener, PA-C 03/25/12 1719

## 2012-03-25 NOTE — ED Notes (Signed)
PA is aware that pelvic is set up.

## 2012-04-14 ENCOUNTER — Ambulatory Visit (INDEPENDENT_AMBULATORY_CARE_PROVIDER_SITE_OTHER): Payer: No Typology Code available for payment source | Admitting: Family Medicine

## 2012-04-14 ENCOUNTER — Other Ambulatory Visit (HOSPITAL_COMMUNITY)
Admission: RE | Admit: 2012-04-14 | Discharge: 2012-04-14 | Disposition: A | Discharge status: Home / Self Care | Payer: No Typology Code available for payment source | Source: Ambulatory Visit | Attending: Family Medicine | Admitting: Family Medicine

## 2012-04-14 ENCOUNTER — Encounter: Payer: Self-pay | Admitting: Family Medicine

## 2012-04-14 VITALS — BP 118/84 | HR 102 | Ht 65.0 in | Wt 263.0 lb

## 2012-04-14 DIAGNOSIS — Z113 Encounter for screening for infections with a predominantly sexual mode of transmission: Secondary | ICD-10-CM | POA: Insufficient documentation

## 2012-04-14 DIAGNOSIS — N898 Other specified noninflammatory disorders of vagina: Secondary | ICD-10-CM

## 2012-04-14 DIAGNOSIS — N949 Unspecified condition associated with female genital organs and menstrual cycle: Secondary | ICD-10-CM

## 2012-04-14 DIAGNOSIS — B9689 Other specified bacterial agents as the cause of diseases classified elsewhere: Secondary | ICD-10-CM | POA: Insufficient documentation

## 2012-04-14 DIAGNOSIS — R102 Pelvic and perineal pain: Secondary | ICD-10-CM

## 2012-04-14 DIAGNOSIS — N760 Acute vaginitis: Secondary | ICD-10-CM

## 2012-04-14 DIAGNOSIS — A499 Bacterial infection, unspecified: Secondary | ICD-10-CM

## 2012-04-14 LAB — POCT WET PREP (WET MOUNT)

## 2012-04-14 MED ORDER — NAPROXEN 500 MG PO TABS: 500.0000 mg | ORAL_TABLET | Freq: Three times a day (TID) | ORAL | Status: DC

## 2012-04-14 MED ORDER — METRONIDAZOLE 500 MG PO TABS: 500.0000 mg | ORAL_TABLET | Freq: Two times a day (BID) | ORAL | Status: DC

## 2012-04-14 MED ORDER — ALBUTEROL SULFATE HFA 108 (90 BASE) MCG/ACT IN AERS: 2.0000 | INHALATION_SPRAY | RESPIRATORY_TRACT | Status: DC | PRN

## 2012-04-14 NOTE — Progress Notes (Signed)
Subjective:     Patient ID: Carla Hodges, female   DOB: 1980/05/30, 32 y.o.   MRN: [id number]  HPI Carla Hodges is a 31-y.o. AAF who presents c/o pelvic pain and a "strong vaginal odor" for the past 3 days.  States that she had unprotected sexual intercourse with her long-time partner on 4 days ago, then noticed some increased spotting and strong, "fishy" vaginal odor the next day.  Endorses lower abdominal cramping.  No new vaginal discharge.  Denies fevers/chills, dizziness, N/V, back pain, or pain with urination or defecation.  Significant PMHx, social hx, medications, and allergies were reviewed today. She has a history of Trichomonas approximately 5 months ago and almost monthly episodes of BV; both she and her partner have been treated for Trichomonas.    She also has a history of uterine fibroids, which causes her to have vaginal spotting 4 to 5 times per week.  States that her periods are typically regular and occur monthly; LMP 03/29/2012.  She has never been pregnant.  Takes Diclofenac for chronic pelvic pain due to fibroids; she is requesting a refill of her medication today.  Review of Systems Negative except as noted in HPI.    Objective:   Physical Exam  Vitals reviewed. Constitutional: She is oriented to person, place, and time. She appears well-developed and well-nourished. No distress.  Cardiovascular: Normal rate, regular rhythm, normal heart sounds and intact distal pulses.   No murmur heard. Pulmonary/Chest: Effort normal and breath sounds normal. No respiratory distress. She has no wheezes.  Abdominal: Soft. Bowel sounds are normal. She exhibits no distension. There is tenderness in the left lower quadrant. There is no rebound and no guarding.  Genitourinary: No erythema around the vagina. No signs of injury around the vagina.  Cervix appears normal on pelvic exam with minimal blood noted at the cervical os.  No noticeable vaginal discharge.  Strong odor noted.  No  cervical motion tenderness.  Firm mass palpated along the posterior fornix indicative of uterine fibroid.  Neurological: She is alert and oriented to person, place, and time.  Skin: Skin is warm and dry.  Psychiatric: She has a normal mood and affect. Her behavior is normal.   Sterile speculum exam and bimanual pelvic exam performed today. Samples for Wet Prep and GC/CT obtained.  Upper level exam addendum: Well appearing, obese female. NAD. Pelvic exam: mild LLQ tenderness on deep palpation. Normal external genitalia. Cervix with mucoid/bloody vagina discharge. No CMT. Palpable posterior uterine fibroid. No adnexal mass or tenderness noted.     Assessment:     1. Vaginal odor 2. Pelvic pain    Plan:     1. Vaginal odor - Strong odor noted on pelvic exam.  Patient has PMHx significant for Trichomonas and multiple cases of BV.  Wet Prep positive for BV.  Metronidazole 500 mg twice daily for 7 days.  Patient counseled to avoid alcohol during medication regimen and to take medication with food to avoid stomach upset.   2. Pelvic pain - Likely multifactorial due to uterine fibroids and current BV.  Naproxen 500mg  three times daily for pain.  Dictated by Lonzo Candy, MS4.     I examined the patient with Student  Dr. Anola Gurney. I have reviewed the note, made necessary revisions and agree with above.  FUNCHES,JOSALYN 04/14/12, 3:54 PM

## 2012-04-14 NOTE — Assessment & Plan Note (Signed)
BV on wet prep. Treating with flagyl.

## 2012-04-14 NOTE — Assessment & Plan Note (Signed)
A: pain and bleeding: suspect uterine fibroids. Considered PID, exam not consistent with this.  P:  Wet prep- reviewed. BV only. GC/Chlam-collected. Pending. No empiric treatment. Fibroids- NSAID now and one day prior to menstrual period.

## 2012-04-14 NOTE — Patient Instructions (Signed)
Thank you for coming in today.  Your wet prep is positive for BV: Not an STD A bacteria that can overgrow when semen in the vagina. Treatment is flagyl for one week. Do not mix with alcohol, take with a small meal if it makes you sick.   Sent in naproxen to take for cramping. remember to start the day before your period.   Printed albuterol script.  Will call you with the other results.  Dr. Armen Pickup

## 2012-05-08 ENCOUNTER — Encounter: Payer: Self-pay | Admitting: Family Medicine

## 2012-05-08 ENCOUNTER — Other Ambulatory Visit (HOSPITAL_COMMUNITY)
Admission: RE | Admit: 2012-05-08 | Discharge: 2012-05-08 | Disposition: A | Discharge status: Home / Self Care | Payer: No Typology Code available for payment source | Source: Ambulatory Visit | Attending: Family Medicine | Admitting: Family Medicine

## 2012-05-08 ENCOUNTER — Ambulatory Visit (INDEPENDENT_AMBULATORY_CARE_PROVIDER_SITE_OTHER): Payer: No Typology Code available for payment source | Admitting: Family Medicine

## 2012-05-08 VITALS — BP 121/80 | HR 88 | Temp 98.2°F | Ht 65.5 in | Wt 256.4 lb

## 2012-05-08 DIAGNOSIS — D219 Benign neoplasm of connective and other soft tissue, unspecified: Secondary | ICD-10-CM

## 2012-05-08 DIAGNOSIS — Z113 Encounter for screening for infections with a predominantly sexual mode of transmission: Secondary | ICD-10-CM | POA: Insufficient documentation

## 2012-05-08 DIAGNOSIS — N949 Unspecified condition associated with female genital organs and menstrual cycle: Secondary | ICD-10-CM

## 2012-05-08 DIAGNOSIS — R102 Pelvic and perineal pain: Secondary | ICD-10-CM

## 2012-05-08 DIAGNOSIS — D259 Leiomyoma of uterus, unspecified: Secondary | ICD-10-CM

## 2012-05-08 DIAGNOSIS — N760 Acute vaginitis: Secondary | ICD-10-CM

## 2012-05-08 DIAGNOSIS — A499 Bacterial infection, unspecified: Secondary | ICD-10-CM

## 2012-05-08 LAB — POCT WET PREP (WET MOUNT): Clue Cells Wet Prep Whiff POC: POSITIVE

## 2012-05-08 MED ORDER — METROGEL-VAGINAL 0.75 % VA GEL: 1.0000 | VAGINAL | Status: DC

## 2012-05-08 MED ORDER — NAPROXEN 500 MG PO TABS: 500.0000 mg | ORAL_TABLET | Freq: Two times a day (BID) | ORAL | Status: DC

## 2012-05-08 MED ORDER — METRONIDAZOLE 500 MG PO TABS: 500.0000 mg | ORAL_TABLET | Freq: Two times a day (BID) | ORAL | Status: DC

## 2012-05-08 NOTE — Assessment & Plan Note (Signed)
Metronidazole BID x 7 days, gave rx for metrogel for suppression, and info handout on BV management

## 2012-05-08 NOTE — Patient Instructions (Addendum)
Think about birth control- you are at risk for pregnancy  Take metronidazole pills twice a day for 1 week  Use metronidazole cream to help keep BV away  See information handout given on BV  Schedule annual checkup with your doctor

## 2012-05-08 NOTE — Assessment & Plan Note (Signed)
refilled naproxen

## 2012-05-08 NOTE — Progress Notes (Signed)
  Subjective:    Patient ID: Carla Hodges, female    DOB: 06-13-1980, 32 y.o.   MRN: [id number]  HPI Here for evaluation of vaginal discharge  Has had for 2-3 days.  Odor.  Has frequently. Brief chart review reveals was seen for this issue: 4/8: BV 3/19: normal eval 1/22: BV 12/20: BV/yeast 11/25 BV/trichomonas  Reports 1 sexual partner.  Non protected.  Does not desire pregnancy but declines contraceptions today.  Plans on returning for annual physical with PCP for HIV/RPR and to discuss further,  No dysuria, abdominal pain, fever  Requests refill of naproxen for stable presentation for treatment of uterine fibroids. Review of Systems See HPI    Objective:   Physical Exam GEN: NAD Pelvic Exam:        External: normal female genitalia without lesions or masses        Vagina: normal without lesions or masses        Cervix: normal without lesions or masses        Adnexa: normal bimanual exam without masses or fullness        Uterus: normal by palpation        Samples for Wet prep, GC/Chlamydia obtained         Assessment & Plan:

## 2012-05-11 ENCOUNTER — Encounter: Payer: Self-pay | Admitting: Family Medicine

## 2012-05-14 ENCOUNTER — Other Ambulatory Visit: Payer: Self-pay | Admitting: Family Medicine

## 2012-05-29 ENCOUNTER — Ambulatory Visit (INDEPENDENT_AMBULATORY_CARE_PROVIDER_SITE_OTHER): Payer: No Typology Code available for payment source | Admitting: Family Medicine

## 2012-05-29 ENCOUNTER — Encounter: Payer: Self-pay | Admitting: Family Medicine

## 2012-05-29 VITALS — BP 107/72 | HR 69 | Temp 98.0°F | Ht 65.5 in | Wt 250.0 lb

## 2012-05-29 DIAGNOSIS — M25521 Pain in right elbow: Secondary | ICD-10-CM

## 2012-05-29 DIAGNOSIS — Z202 Contact with and (suspected) exposure to infections with a predominantly sexual mode of transmission: Secondary | ICD-10-CM

## 2012-05-29 DIAGNOSIS — M25529 Pain in unspecified elbow: Secondary | ICD-10-CM

## 2012-05-29 DIAGNOSIS — J3489 Other specified disorders of nose and nasal sinuses: Secondary | ICD-10-CM

## 2012-05-29 DIAGNOSIS — Z9189 Other specified personal risk factors, not elsewhere classified: Secondary | ICD-10-CM

## 2012-05-29 DIAGNOSIS — Z0000 Encounter for general adult medical examination without abnormal findings: Secondary | ICD-10-CM

## 2012-05-29 DIAGNOSIS — Z01419 Encounter for gynecological examination (general) (routine) without abnormal findings: Secondary | ICD-10-CM | POA: Insufficient documentation

## 2012-05-29 MED ORDER — MUPIROCIN 2 % EX OINT: TOPICAL_OINTMENT | Freq: Three times a day (TID) | CUTANEOUS | Status: DC

## 2012-05-29 MED ORDER — DICLOFENAC SODIUM 75 MG PO TBEC: 75.0000 mg | DELAYED_RELEASE_TABLET | Freq: Two times a day (BID) | ORAL | Status: DC | PRN

## 2012-05-29 NOTE — Assessment & Plan Note (Signed)
Will screen for lipids today. Has had 3 normal paps for the last 3 years, therefore not needed until 2016.

## 2012-05-29 NOTE — Patient Instructions (Addendum)
It was good to see you today.  Please stop by the lab. I will call you with results next week.  For your arm, take the Voltaren twice daily for the pain. Continue to use heat and try to not lay on your arm at night.  For your nose, I have sent in an antibiotic ointment. If you cannot get this, try Neosporin on a Q-tip instead.  I will see you back as needed.  Take care! Adanya Sosinski M. Ashaya Raftery, M.D.

## 2012-05-29 NOTE — Assessment & Plan Note (Signed)
Does not appear to be an abscess, but likely from irritation. Will treat with Mupirocin ointment TID until resolved. Hygiene encouraged.

## 2012-05-29 NOTE — Assessment & Plan Note (Signed)
Most likely tendonitis from overuse or sleeping on arm. Encouraged to be aware of causes of pain. Continue heat and Voltaren PO prn for pain. RTC if worsens.

## 2012-05-29 NOTE — Assessment & Plan Note (Signed)
No new partners, but has not been screened for HIV or RPR from previous partner. Desires screening today.

## 2012-05-29 NOTE — Progress Notes (Signed)
  Subjective:     Carla Hodges is a 32 y.o. female and is here for a comprehensive physical exam. The patient reports problems - sore in nose and right arm pain and bruising.  She has had BV multiple times. Last visit started on Metrogel for prevention but she could not afford this. No further episodes of vaginal discharge. She has not been sleeping with the same partner. No new partners. She also is requesting HIV/RPR testing.  1. Sore in nose has been there for one month. Has scabs in nose that irritate her. Has tried chapstick which helps the dryness but it has not gone away.  2. Right arm pain has been there intermittently for 3-4 months. Does not work, but she does use that arm frequently. Worse with sleeping on her arm. Better with heating pad at night.  3. Reports bruising on her arms and legs, more than usual. Unsure of any injury  History   Social History  . Marital Status: Single    Spouse Name: N/A    Number of Children: N/A  . Years of Education: N/A   Occupational History  . Not on file.   Social History Main Topics  . Smoking status: Former Smoker    Quit date: 04/12/2011  . Smokeless tobacco: Not on file  . Alcohol Use: Yes     Comment: cut back and now drinks on weekends  . Drug Use: No  . Sexually Active: Yes    Birth Control/ Protection: None   Other Topics Concern  . Not on file   Social History Narrative   Lives with grandmother in [place]. 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    Health Maintenance  Topic Date Due  . Tetanus/tdap  04/03/2010  . Influenza Vaccine  09/07/2012  . Pap Smear  05/07/2014    The following portions of the patient's history were reviewed and updated as appropriate: allergies, current medications, past family history, past medical history, past social history, past surgical history and problem list.  Review of Systems Pertinent items are noted in HPI.   Objective:    BP 107/72  Pulse 69   Temp(Src) 98 F (36.7 C) (Oral)  Ht 5' 5.5" (1.664 m)  Wt 250 lb (113.399 kg)  BMI 40.95 kg/m2  LMP 05/21/2012 General appearance: alert, cooperative and no distress Head: Normocephalic, without obvious abnormality, atraumatic, Peeling, moist lesion on left side of nasal septum with surrounding erythema, no signs of abscess or bleeding Neck: no adenopathy, no carotid bruit, no JVD, supple, symmetrical, trachea midline and thyroid not enlarged, symmetric, no tenderness/mass/nodules Lungs: clear to auscultation bilaterally Heart: regular rate and rhythm, S1, S2 normal, no murmur, click, rub or gallop Abdomen: soft, non-tender; bowel sounds normal; no masses,  no organomegaly Pulses: 2+ and symmetric Skin: Skin color, texture, turgor normal. No rashes or lesions Neurologic: Alert and oriented X 3, normal strength and tone. Normal symmetric reflexes. Normal coordination and gait  Extremities: Right upper extremity without swelling or redness. Mild TTP of biceps tendon. FROM of shoulder.   Assessment:    Healthy female exam     Plan:     See After Visit Summary for Counseling Recommendations

## 2012-05-30 LAB — HIV ANTIBODY (ROUTINE TESTING W REFLEX): HIV: NONREACTIVE

## 2012-06-03 ENCOUNTER — Encounter: Payer: Self-pay | Admitting: Family Medicine

## 2012-06-15 ENCOUNTER — Ambulatory Visit (INDEPENDENT_AMBULATORY_CARE_PROVIDER_SITE_OTHER): Payer: No Typology Code available for payment source | Admitting: Family Medicine

## 2012-06-15 ENCOUNTER — Other Ambulatory Visit (HOSPITAL_COMMUNITY)
Admission: RE | Admit: 2012-06-15 | Discharge: 2012-06-15 | Disposition: A | Discharge status: Home / Self Care | Payer: No Typology Code available for payment source | Source: Ambulatory Visit | Attending: Family Medicine | Admitting: Family Medicine

## 2012-06-15 VITALS — BP 110/78 | HR 71 | Temp 97.8°F | Wt 245.0 lb

## 2012-06-15 DIAGNOSIS — Z113 Encounter for screening for infections with a predominantly sexual mode of transmission: Secondary | ICD-10-CM | POA: Insufficient documentation

## 2012-06-15 DIAGNOSIS — R102 Pelvic and perineal pain: Secondary | ICD-10-CM

## 2012-06-15 DIAGNOSIS — N898 Other specified noninflammatory disorders of vagina: Secondary | ICD-10-CM

## 2012-06-15 DIAGNOSIS — N949 Unspecified condition associated with female genital organs and menstrual cycle: Secondary | ICD-10-CM

## 2012-06-15 DIAGNOSIS — Z20828 Contact with and (suspected) exposure to other viral communicable diseases: Secondary | ICD-10-CM

## 2012-06-15 DIAGNOSIS — Z9189 Other specified personal risk factors, not elsewhere classified: Secondary | ICD-10-CM

## 2012-06-15 DIAGNOSIS — Z202 Contact with and (suspected) exposure to infections with a predominantly sexual mode of transmission: Secondary | ICD-10-CM

## 2012-06-15 LAB — POCT WET PREP (WET MOUNT): Clue Cells Wet Prep Whiff POC: NEGATIVE

## 2012-06-15 MED ORDER — NAPROXEN 500 MG PO TABS: 500.0000 mg | ORAL_TABLET | Freq: Two times a day (BID) | ORAL | Status: DC

## 2012-06-15 NOTE — Assessment & Plan Note (Signed)
Will check Wet prep, G/C Chlamydia, HIV, RPR.  Do not suspect herpes based on exam today, but will monitor.  Discussed safe sex, advised birth control.  Discussed Mirena given chronc pelvic pain.  Pregnancy test neg.

## 2012-06-15 NOTE — Progress Notes (Signed)
  Subjective:    Patient ID: Carla Hodges, female    DOB: 01-25-1980, 32 y.o.   MRN: [id number]  HPI  Carla Hodges comes in due to unprotected intercourse.  She says last weekend she had sex with an old boyfriend and they used a condom but it broke.  She says she is worried about STD exposure.  She says she also has a bump on her R labia, it is slightly irritated.  Has thin white discharge without odor.  No dysuria, abdominal pain, fevers, nausea.   Review of Systems See HPI    Objective:   Physical Exam BP 110/78  Pulse 71  Temp(Src) 97.8 F (36.6 C) (Oral)  Wt 245 lb (111.131 kg)  BMI 40.14 kg/m2  LMP 05/21/2012 General appearance: alert, cooperative and no distress Pelvic: cervix normal in appearance, no adnexal masses or tenderness, no cervical motion tenderness, rectovaginal septum normal and uterus normal size, shape, and consistency There is a small raised, painful area on R labia that is tender, no fluctuance or induration.  Does not appear herpetic.        Assessment & Plan:

## 2012-06-15 NOTE — Patient Instructions (Signed)
I will send you a letter with your lab results, or call you if anything is abnormal.    Please be sure to always use protection.    If you are interested in a Mirena, please call the office, you will need to fill out paperwork to apply for a scholarship.

## 2012-06-16 LAB — HIV ANTIBODY (ROUTINE TESTING W REFLEX): HIV: NONREACTIVE

## 2012-06-17 ENCOUNTER — Telehealth: Payer: Self-pay | Admitting: *Deleted

## 2012-06-17 ENCOUNTER — Ambulatory Visit (INDEPENDENT_AMBULATORY_CARE_PROVIDER_SITE_OTHER): Payer: No Typology Code available for payment source | Admitting: *Deleted

## 2012-06-17 DIAGNOSIS — Z0110 Encounter for examination of ears and hearing without abnormal findings: Secondary | ICD-10-CM

## 2012-06-17 DIAGNOSIS — Z0100 Encounter for examination of eyes and vision without abnormal findings: Secondary | ICD-10-CM

## 2012-06-17 NOTE — Telephone Encounter (Signed)
Patient dropped off school form and it was placed in box for completion. She will bring up immunization record when she comes to pick up.

## 2012-06-17 NOTE — Telephone Encounter (Signed)
Pt informed of test result.  Advised we would give her a call when the form is completed and available for pick up. Carla Hodges, Carla Hodges

## 2012-06-17 NOTE — Progress Notes (Signed)
Patient here today for hearing and vision screen for college PE form.  Clinical info completed on form by Jimmy Footman, CMA and placed in Dr. Algis Downs box.  Will call patient when form has been completed.  Gaylene Brooks, RN

## 2012-06-17 NOTE — Telephone Encounter (Signed)
Please call patient, notify her that all STD testing, including HIV, Syphilis, Gonorrhea, Chlamydia, and Trichomonas all negative.

## 2012-06-18 NOTE — Telephone Encounter (Signed)
School form was not in my box for completion. Has this already been addressed?  Thank you! Jonah Nestle M. Aletta Edmunds, M.D.

## 2012-06-18 NOTE — Progress Notes (Signed)
Form has been completed and placed up front. Please let patient know she will still need her immunization record for the school.  Thank you, Emeri Estill M. Kache Mcclurg, M.D.

## 2012-06-23 ENCOUNTER — Ambulatory Visit (INDEPENDENT_AMBULATORY_CARE_PROVIDER_SITE_OTHER): Payer: No Typology Code available for payment source | Admitting: *Deleted

## 2012-06-23 DIAGNOSIS — Z9289 Personal history of other medical treatment: Secondary | ICD-10-CM

## 2012-06-23 DIAGNOSIS — Z23 Encounter for immunization: Secondary | ICD-10-CM

## 2012-06-23 NOTE — Progress Notes (Signed)
Pt here today for immunizations:menactra and tdap. . Pt tolerated well. Advised  Tylenol if needed. NO further questions or concerns noted. Shot record to be entered in [facility] and pt will pick up tomorrow. Wyatt Haste, RN-BSN

## 2012-06-24 ENCOUNTER — Ambulatory Visit (HOSPITAL_COMMUNITY)
Admission: RE | Admit: 2012-06-24 | Discharge: 2012-06-24 | Disposition: A | Discharge status: Home / Self Care | Payer: No Typology Code available for payment source | Source: Ambulatory Visit | Attending: Family Medicine | Admitting: Family Medicine

## 2012-06-24 DIAGNOSIS — R7611 Nonspecific reaction to tuberculin skin test without active tuberculosis: Secondary | ICD-10-CM | POA: Insufficient documentation

## 2012-06-24 DIAGNOSIS — Z9289 Personal history of other medical treatment: Secondary | ICD-10-CM

## 2012-07-07 ENCOUNTER — Encounter: Payer: Self-pay | Admitting: Family Medicine

## 2012-07-07 ENCOUNTER — Ambulatory Visit (INDEPENDENT_AMBULATORY_CARE_PROVIDER_SITE_OTHER): Payer: No Typology Code available for payment source | Admitting: Family Medicine

## 2012-07-07 ENCOUNTER — Telehealth: Payer: Self-pay | Admitting: Family Medicine

## 2012-07-07 VITALS — BP 103/71 | HR 84 | Temp 98.2°F | Wt 245.0 lb

## 2012-07-07 DIAGNOSIS — L089 Local infection of the skin and subcutaneous tissue, unspecified: Secondary | ICD-10-CM | POA: Insufficient documentation

## 2012-07-07 DIAGNOSIS — T148XXA Other injury of unspecified body region, initial encounter: Secondary | ICD-10-CM | POA: Insufficient documentation

## 2012-07-07 DIAGNOSIS — T798XXA Other early complications of trauma, initial encounter: Secondary | ICD-10-CM

## 2012-07-07 MED ORDER — CIPROFLOXACIN HCL 500 MG PO TB24: 500.0000 mg | ORAL_TABLET | Freq: Every day | ORAL | Status: AC

## 2012-07-07 NOTE — Progress Notes (Signed)
Subjective:     Patient ID: Carla Hodges, female   DOB: 1981-01-02, 32 y.o.   MRN: [id number]  HPI Wound:Patient c/o wound on her private area which started as a small bump which turned into a boil,it later popped,now it is swollen and bleeding.She has irritation and itching,with pain of about 8/10 in severity. This has been on going for about 1 wk,she recently she scratched her bump and it is hurting as well. She had similar symptoms 1 yrs ago but not as bad as this.  Current Outpatient Prescriptions on File Prior to Visit  Medication Sig Dispense Refill  . albuterol (PROVENTIL HFA;VENTOLIN HFA) 108 (90 BASE) MCG/ACT inhaler Inhale 2 puffs into the lungs every 4 (four) hours as needed for wheezing.  1 Inhaler  2  . FLUoxetine (PROZAC) 10 MG capsule Take 20 mg by mouth daily.      . naproxen (NAPROSYN) 500 MG tablet Take 1 tablet (500 mg total) by mouth 2 (two) times daily with a meal.  60 tablet  0  . traZODone (DESYREL) 50 MG tablet Take 50 mg by mouth at bedtime. From [facility]       No current facility-administered medications on file prior to visit.   Past Medical History  Diagnosis Date  . Bronchitis, chronic   . Fibroids       Review of Systems  Constitutional: Negative for fever.  Respiratory: Negative.   Cardiovascular: Negative.   Genitourinary: Negative for vaginal discharge.  Skin: Positive for wound.  All other systems reviewed and are negative.    Filed Vitals:   07/07/12 1019  BP: 103/71  Pulse: 84  Temp: 98.2 F (36.8 C)  TempSrc: Oral  Weight: 245 lb (111.131 kg)       Objective:   Physical Exam  Nursing note and vitals reviewed. Constitutional: She appears well-developed. No distress.  Cardiovascular: Normal rate, regular rhythm, normal heart sounds and intact distal pulses.   No murmur heard. Pulmonary/Chest: Effort normal and breath sounds normal. No respiratory distress. She has no wheezes.  Abdominal: Normal appearance and bowel sounds are  normal. She exhibits no mass. There is no tenderness. Hernia confirmed negative in the right inguinal area and confirmed negative in the left inguinal area.  Skin:          Assessment/Plan:     Wound infection

## 2012-07-07 NOTE — Telephone Encounter (Signed)
Patient is calling because the medication that was prescribed is too expensive so she is asking for something less expensive.  Also, please call her back and let her know what the change is going to be.

## 2012-07-07 NOTE — Telephone Encounter (Signed)
Will forward to Dr. Lum Babe since she is the MD who saw her.  Carla Hodges,  Carla Hodges

## 2012-07-07 NOTE — Patient Instructions (Addendum)
Wound Care Wound care helps prevent pain and infection.  You may need a tetanus shot if:  You cannot remember when you had your last tetanus shot.  You have never had a tetanus shot.  The injury broke your skin. If you need a tetanus shot and you choose not to have one, you may get tetanus. Sickness from tetanus can be serious. HOME CARE   Only take medicine as told by your doctor.  Clean the wound daily with mild soap and water.  Change any bandages (dressings) as told by your doctor.  Put medicated cream and a bandage on the wound as told by your doctor.  Change the bandage if it gets wet, dirty, or starts to smell.  Take showers. Do not take baths, swim, or do anything that puts your wound under water.  Rest and raise (elevate) the wound until the pain and puffiness (swelling) are better.  Keep all doctor visits as told. GET HELP RIGHT AWAY IF:   Yellowish-white fluid (pus) comes from the wound.  Medicine does not lessen your pain.  There is a red streak going away from the wound.  You have a fever. MAKE SURE YOU:   Understand these instructions.  Will watch your condition.  Will get help right away if you are not doing well or get worse. Document Released: 10/03/2007 Document Revised: 03/18/2011 Document Reviewed: 04/29/2010 Exit[employer] Patient Information 2014 [employer].  

## 2012-07-07 NOTE — Telephone Encounter (Signed)
Medication changed to regular cipro

## 2012-07-07 NOTE — Assessment & Plan Note (Signed)
This developed from ingrown hair which she irritated and caused a break in her skin. Avoid popping bumps in her groin recommended. Wound culture obtained. Wound dressing done today. I started her on Ciprofloxacin pending culture report. F/U in 2 wks for reassessment or sooner if symptom worsens. Patient verbalized understanding.

## 2012-07-10 ENCOUNTER — Encounter (HOSPITAL_COMMUNITY): Payer: Self-pay | Admitting: *Deleted

## 2012-07-10 ENCOUNTER — Emergency Department (HOSPITAL_COMMUNITY)
Admission: EM | Admit: 2012-07-10 | Discharge: 2012-07-10 | Disposition: A | Discharge status: Home / Self Care | Payer: No Typology Code available for payment source | Attending: Emergency Medicine | Admitting: Emergency Medicine

## 2012-07-10 DIAGNOSIS — N764 Abscess of vulva: Secondary | ICD-10-CM | POA: Insufficient documentation

## 2012-07-10 DIAGNOSIS — Z87891 Personal history of nicotine dependence: Secondary | ICD-10-CM | POA: Insufficient documentation

## 2012-07-10 DIAGNOSIS — Z8709 Personal history of other diseases of the respiratory system: Secondary | ICD-10-CM | POA: Insufficient documentation

## 2012-07-10 DIAGNOSIS — D259 Leiomyoma of uterus, unspecified: Secondary | ICD-10-CM | POA: Insufficient documentation

## 2012-07-10 DIAGNOSIS — Z79899 Other long term (current) drug therapy: Secondary | ICD-10-CM | POA: Insufficient documentation

## 2012-07-10 LAB — WOUND CULTURE

## 2012-07-10 MED ORDER — CEPHALEXIN 500 MG PO CAPS: 500.0000 mg | ORAL_CAPSULE | Freq: Four times a day (QID) | ORAL | Status: DC

## 2012-07-10 MED ORDER — SULFAMETHOXAZOLE-TRIMETHOPRIM 800-160 MG PO TABS: 1.0000 | ORAL_TABLET | Freq: Two times a day (BID) | ORAL | Status: AC

## 2012-07-10 NOTE — ED Notes (Signed)
Pt states she has a big ol' boil on her "cooty lips and bumps on her butt that are painful"  Pt is alert and oriented in NAD

## 2012-07-10 NOTE — ED Provider Notes (Signed)
Medical screening examination/treatment/procedure(s) were performed by non-physician practitioner and as supervising physician I was immediately available for consultation/collaboration.   Joya Gaskins, MD 07/10/12 [phone]

## 2012-07-10 NOTE — ED Provider Notes (Signed)
History    CSN: [id number] Arrival date & time 07/10/12  0143  First MD Initiated Contact with Patient 07/10/12 [phone]     Chief Complaint  Patient presents with  . Cyst   (Consider location/radiation/quality/duration/timing/severity/associated sxs/prior Treatment) Patient is a 32 y.o. female presenting with abscess. The history is provided by the patient. No language interpreter was used.  Abscess Abscess location: L labia majora. Abscess quality: fluctuance (mild), induration, painful and redness   Abscess quality: not draining (3cm x 3cm) and not weeping   Red streaking: no   Duration:  6 days Progression:  Worsening Pain details:    Quality:  Dull, sharp, throbbing and aching   Severity:  Moderate   Timing:  Constant   Progression:  Worsening Chronicity:  New Context: not diabetes and not immunosuppression   Relieved by:  Nothing Worsened by:  Nothing tried Ineffective treatments: PO ciprofloxacin. Associated symptoms: no fever, no nausea and no vomiting   Associated symptoms comment:  No urinary symptoms, vaginal complaints, numbness or tingling, or extremity weakness Risk factors: prior abscess   Risk factors: no hx of MRSA    Past Medical History  Diagnosis Date  . Bronchitis, chronic   . Fibroids    Past Surgical History  Procedure Laterality Date  . Hysteroscopy      2000   Family History  Problem Relation Age of Onset  . Alcohol abuse Neg Hx   . Arthritis Neg Hx   . Asthma Neg Hx   . Birth defects Neg Hx   . Cancer Neg Hx   . COPD Neg Hx   . Depression Neg Hx   . Diabetes Neg Hx     Pt states family is "prone to DM"  . Drug abuse Neg Hx   . Early death Neg Hx   . Hearing loss Neg Hx   . Heart disease Neg Hx   . Hyperlipidemia Neg Hx   . Hypertension Neg Hx   . Kidney disease Neg Hx   . Learning disabilities Neg Hx   . Mental illness Neg Hx   . Mental retardation Neg Hx   . Miscarriages / Stillbirths Neg Hx   . Stroke Neg Hx   . Vision loss  Neg Hx    History  Substance Use Topics  . Smoking status: Former Smoker    Quit date: 04/12/2011  . Smokeless tobacco: Not on file  . Alcohol Use: Yes     Comment: cut back and now drinks on weekends   OB History   Grav Para Term Preterm Abortions TAB SAB Ect Mult Living   0              Review of Systems  Constitutional: Negative for fever.  Gastrointestinal: Negative for nausea and vomiting.  Genitourinary:       + labial tenderness  Skin: Positive for color change. Negative for pallor.  Neurological: Negative for weakness and numbness.  All other systems reviewed and are negative.    Allergies  Latex  Home Medications   Current Outpatient Rx  Name  Route  Sig  Dispense  Refill  . ciprofloxacin (CIPRO XR) 500 MG 24 hr tablet   Oral   Take 1 tablet (500 mg total) by mouth daily.   3 tablet   0   . FLUoxetine (PROZAC) 10 MG capsule   Oral   Take 20 mg by mouth daily.         . naproxen (NAPROSYN) 500  MG tablet   Oral   Take 500 mg by mouth 2 (two) times daily as needed. For pain         . traZODone (DESYREL) 50 MG tablet   Oral   Take 50 mg by mouth at bedtime as needed for sleep. From [place]         . cephALEXin (KEFLEX) 500 MG capsule   Oral   Take 1 capsule (500 mg total) by mouth 4 (four) times daily.   28 capsule   0   . sulfamethoxazole-trimethoprim (BACTRIM DS,SEPTRA DS) 800-160 MG per tablet   Oral   Take 1 tablet by mouth 2 (two) times daily.   14 tablet   0    BP 102/73  Pulse 69  Temp(Src) 98.9 F (37.2 C) (Oral)  Resp 18  SpO2 98%  LMP 06/17/2012 Physical Exam  Nursing note and vitals reviewed. Constitutional: She is oriented to person, place, and time. She appears well-developed and well-nourished. No distress.  HENT:  Head: Normocephalic and atraumatic.  Mouth/Throat: Oropharynx is clear and moist. No oropharyngeal exudate.  Eyes: Conjunctivae and EOM are normal. Pupils are equal, round, and reactive to light. No  scleral icterus.  Neck: Normal range of motion.  Cardiovascular: Normal rate, regular rhythm and normal heart sounds.   Pulmonary/Chest: Effort normal and breath sounds normal. No respiratory distress. She has no wheezes. She has no rales.  Abdominal: Soft. She exhibits no distension. There is no tenderness. There is no rebound.  Genitourinary:    There is tenderness and lesion (abscess) on the left labia. There is no rash or injury on the left labia.  Musculoskeletal: Normal range of motion. She exhibits no edema.  Neurological: She is alert and oriented to person, place, and time.  No sensory or motor deficits appreciated  Skin: Skin is warm and dry. No rash noted. She is not diaphoretic. No pallor.  Psychiatric: She has a normal mood and affect. Her behavior is normal.    ED Course  Procedures (including critical care time) Labs Reviewed - No data to display No results found.  INCISION AND DRAINAGE Performed by: Antony Madura Consent: Verbal consent obtained. Risks and benefits: risks, benefits and alternatives were discussed Type: abscess  Body area: L labia majora/groin  Anesthesia: local infiltration  Incision was made with a scalpel.  Local anesthetic: lidocaine 2% with epinephrine  Anesthetic total: 0.5 ml  Complexity: complex Blunt dissection to break up loculations  Drainage: purulent  Drainage amount: moderate  Packing material: none  Patient tolerance: Patient tolerated the procedure well with no immediate complications.   1. Abscess of labia majora     MDM  Uncomplicated abscess of L labia majora/groin - Physical exam as above; patient neurovascularly intact, afebrile and hemodynamically stable. Abscess drained in ED without complications. Patient reliable for follow up with primary care provider and appropriate for d/c. Have advised warm soaks as well as Bactrim and Keflex in light of multiple recent abscess formations (abscess just drained and  healing in suprapubic region ~1 week ago). Ibuprofen recommended for discomfort. Indications for ED return discussed with patient who verbalizes comfort and understanding with this d/c plan.  Antony Madura, [place] 07/10/12 [phone]

## 2012-07-22 ENCOUNTER — Ambulatory Visit (INDEPENDENT_AMBULATORY_CARE_PROVIDER_SITE_OTHER): Payer: No Typology Code available for payment source | Admitting: Family Medicine

## 2012-07-22 ENCOUNTER — Encounter: Payer: Self-pay | Admitting: Family Medicine

## 2012-07-22 VITALS — BP 101/68 | HR 71 | Temp 98.4°F | Ht 65.5 in | Wt 241.0 lb

## 2012-07-22 DIAGNOSIS — L0291 Cutaneous abscess, unspecified: Secondary | ICD-10-CM | POA: Insufficient documentation

## 2012-07-22 MED ORDER — NAPROXEN 500 MG PO TABS: 500.0000 mg | ORAL_TABLET | Freq: Two times a day (BID) | ORAL | Status: DC | PRN

## 2012-07-22 NOTE — Progress Notes (Signed)
Patient ID: Pincus Badder, female   DOB: June 28, 1980, 32 y.o.   MRN: [id number]  [facility] Keijuan Schellhase M. Eisa Conaway, MD Phone: [phone]   Subjective: HPI: Patient is a 32 y.o. female presenting to clinic today for follow up appointment for abscess. She had I&D of mons pubis with culture that grew staph. Completed full course of antibiotics. Also has boils on left labia majora and left buttock. One on buttock has drained spontaneously, and one on labia has improved in size and pain. No fevers, no other concerns.  History Reviewed: Former smoker. Health Maintenance: UTD  ROS: Please see HPI above.  Objective: Office vital signs reviewed. BP 101/68  Pulse 71  Temp(Src) 98.4 F (36.9 C) (Oral)  Ht 5' 5.5" (1.664 m)  Wt 241 lb (109.317 kg)  BMI 39.48 kg/m2  LMP 06/17/2012  Physical Examination:  General: Awake, alert. NAD. Comfortable Pulm: CTAB, no wheezes Cardio: RRR, no murmurs appreciated Abdomen:+BS, soft, nontender, nondistended GU: Abscess resolved on mons, well healed scar, no induration or tenderness. Very small indurated area on left labia without erythema or obvious pore. 2 smaller scabbed areas on left buttock that are not enlarged or currently draining.  Assessment: 32 y.o. female follow up appointment.   Plan: See Problem List and After Visit Summary

## 2012-07-22 NOTE — Patient Instructions (Addendum)
It looks like your boils have healed nicely. No need for antibiotics right now. You can continue taking your pain medicine as needed. I would recommend using heat on the inside sore, but if it does not get better please let us know if it gets larger, redder or more painful.  Amber M. Hairford, M.D.

## 2012-07-22 NOTE — Assessment & Plan Note (Signed)
Resolved or improving. No indication for antibiotics today. Refilled Naproxen for pain. Given reasons to return to clinic.

## 2012-08-04 ENCOUNTER — Ambulatory Visit: Payer: No Typology Code available for payment source | Admitting: Family Medicine

## 2012-08-04 ENCOUNTER — Other Ambulatory Visit (HOSPITAL_COMMUNITY)
Admission: RE | Admit: 2012-08-04 | Discharge: 2012-08-04 | Disposition: A | Discharge status: Home / Self Care | Payer: No Typology Code available for payment source | Source: Ambulatory Visit | Attending: Family Medicine | Admitting: Family Medicine

## 2012-08-04 ENCOUNTER — Ambulatory Visit (INDEPENDENT_AMBULATORY_CARE_PROVIDER_SITE_OTHER): Payer: No Typology Code available for payment source | Admitting: Family Medicine

## 2012-08-04 ENCOUNTER — Encounter: Payer: Self-pay | Admitting: Family Medicine

## 2012-08-04 VITALS — BP 113/81 | HR 82 | Temp 98.2°F | Wt 244.0 lb

## 2012-08-04 DIAGNOSIS — Z113 Encounter for screening for infections with a predominantly sexual mode of transmission: Secondary | ICD-10-CM | POA: Insufficient documentation

## 2012-08-04 DIAGNOSIS — Z9189 Other specified personal risk factors, not elsewhere classified: Secondary | ICD-10-CM

## 2012-08-04 DIAGNOSIS — N949 Unspecified condition associated with female genital organs and menstrual cycle: Secondary | ICD-10-CM

## 2012-08-04 DIAGNOSIS — N760 Acute vaginitis: Secondary | ICD-10-CM

## 2012-08-04 DIAGNOSIS — Z202 Contact with and (suspected) exposure to infections with a predominantly sexual mode of transmission: Secondary | ICD-10-CM

## 2012-08-04 DIAGNOSIS — N898 Other specified noninflammatory disorders of vagina: Secondary | ICD-10-CM

## 2012-08-04 DIAGNOSIS — A499 Bacterial infection, unspecified: Secondary | ICD-10-CM

## 2012-08-04 DIAGNOSIS — B9689 Other specified bacterial agents as the cause of diseases classified elsewhere: Secondary | ICD-10-CM

## 2012-08-04 LAB — POCT WET PREP (WET MOUNT)

## 2012-08-04 MED ORDER — METRONIDAZOLE 500 MG PO TABS: 500.0000 mg | ORAL_TABLET | Freq: Two times a day (BID) | ORAL | Status: DC

## 2012-08-04 MED ORDER — FLUCONAZOLE 150 MG PO TABS: 150.0000 mg | ORAL_TABLET | Freq: Once | ORAL | Status: DC

## 2012-08-04 NOTE — Assessment & Plan Note (Signed)
A: BV P: Treat per orders.  Flagyl followed by diflucan. Clinic staff will contact patient with the testing results when available.

## 2012-08-04 NOTE — Patient Instructions (Addendum)
Ms. Barnwell,  Thank you for coming in today.  Wet prep results: BV. Flagyl x 7 days. Followed by diflucan x one to prevent yeast infection.   Me or my nurse will call you with results of gonorrhea, chlamydi, HIV and syphillis.   Dr. Armen Pickup

## 2012-08-04 NOTE — Progress Notes (Signed)
Subjective:     Patient ID: Carla Hodges, female   DOB: [birth year], 32 y.o.   MRN: [id number]  HPI This is a 32 yo woman with prev hx of BV and trichomonas presenting today with fishy vaginal odor and white thin d/c that began this past Friday (07/31/2012). The odor and d/c has not changed since it started and pt has not tried any medications or remedies. She expresses concern for STIs as she has had a new partner w/ questionable STI status. They had vaginal intercourse 1x w/ condom and have not had intercourse since. In the past 6 mos she has had 2 partners total. Pt uses condoms occasionally w/ no other form of contraception. Denies abdominal/pelvic pain, vulvar/vaginal sores/lesions, or dyspareunia. No vaginal d/c or bleeding.   Her LMP was on July 7th 2014, and has had normal periods w/ heavy bleeding (baseline due to hx of fibroids). She does not believe she is pregnant.   Review of Systems General: Denies fevers, chills, N/V, pain  GI: Has normal BMs, no change in color, freq, consistency. No pain on defecation.   GU: No dysuria, change in urinary freq, hematuria.   Rest of ROS as per HPI    Objective:   Physical Exam BP 113/81  Pulse 82  Temp(Src) 98.2 F (36.8 C) (Oral)  Wt 244 lb (110.678 kg)  BMI 39.97 kg/m2  LMP 07/22/2012  General Appearance:    NAD. A&Ox3. Speaks in full sentences and answers questions appropriately.  Head:    Normocephalic, without obvious abnormality, atraumatic  Eyes:    No scleral icterus or conjunctival pallor  Lungs:     Respirations unlabored  Pelvic:    Normal introitus for age. Vulva non-tender to palpation. No vulvar/vaginal atrophy, lesions, bleeding.  No d/c present on exterior of genitalia. Cervix revealed white discharge w/ slight odor. No cervical motion or adnexal tenderness. No adnexal masses. Possible inferoposterior fibroid palpated on bimanual.  Skin:   Skin color, texture, turgor normal, no rashes or lesions   Wet Prep: + whiff  test w/ moderate clue cells.   BP 113/81  Pulse 82  Temp(Src) 98.2 F (36.8 C) (Oral)  Wt 244 lb (110.678 kg)  BMI 39.97 kg/m2  LMP 07/22/2012 General appearance: alert, cooperative and no distress Abdomen: soft, non-tender; bowel sounds normal; no masses,  no organomegaly Pelvic:  Pelvic: External:  external genitalia normal Vagina: normal vaginal mucosa with homogenous is white discharge Cervix: normal cervix without lesions or discharge. Bimanual: no cervical motion tenderness. Palpable posterior lower segment fibroids. No uterus or adnexal tenderness.     Assessment:    Problem List: 1. Vaginal d/c and odor  This is a 32 yo woman w/ vaginal d/c and odor that is consistent with BV.     Plan:     1. Vaginal d/c and odor: Hx of fishy odor d/c and recent new partner is most characteristic of BV. Wet prep confirms dx. Plan is to administer Metronidazole 500 mg bid for seven days, followed by 1 time 150 mg fluconazole to prevent yeast infxn. F/u as needed if symptoms persist or worsen, or if she experiences signs of systemic infxn (fever, chills, N/V). As per pt concern GC/Chlamydia, trichomonas, HIV, and syphilis will be investigated. Wet prep did not show trichomonas and coverage is provided by metronidazole anyway. Will f/u w/ pt by phone regarding results of GC/Chlamydia, HIV, and RPR test results.       I examined the patient with Student Dr.  Donnal Moat. I have reviewed the note, made necessary revisions including adding my own exam in bold print and agree with above.  Dessa Phi

## 2012-08-05 ENCOUNTER — Telehealth: Payer: Self-pay | Admitting: *Deleted

## 2012-08-05 NOTE — Telephone Encounter (Signed)
Pt is aware of results. Jazmin Hartsell,CMA  

## 2012-08-05 NOTE — Telephone Encounter (Signed)
Message copied by Henri Medal on Wed Aug 05, 2012  1:48 PM ------      Message from: Dessa Phi      Created: Wed Aug 05, 2012 12:22 PM       Negative HIV, RPR, GC and chlamydia testing. Please inform patient. ------

## 2012-08-26 ENCOUNTER — Ambulatory Visit: Payer: No Typology Code available for payment source | Admitting: Family Medicine

## 2012-08-31 ENCOUNTER — Ambulatory Visit (INDEPENDENT_AMBULATORY_CARE_PROVIDER_SITE_OTHER): Payer: No Typology Code available for payment source | Admitting: Family Medicine

## 2012-08-31 ENCOUNTER — Encounter: Payer: Self-pay | Admitting: Family Medicine

## 2012-08-31 VITALS — BP 111/78 | HR 62 | Temp 98.6°F | Wt 238.0 lb

## 2012-08-31 DIAGNOSIS — N760 Acute vaginitis: Secondary | ICD-10-CM

## 2012-08-31 DIAGNOSIS — A499 Bacterial infection, unspecified: Secondary | ICD-10-CM

## 2012-08-31 DIAGNOSIS — N949 Unspecified condition associated with female genital organs and menstrual cycle: Secondary | ICD-10-CM

## 2012-08-31 DIAGNOSIS — B9689 Other specified bacterial agents as the cause of diseases classified elsewhere: Secondary | ICD-10-CM

## 2012-08-31 DIAGNOSIS — N898 Other specified noninflammatory disorders of vagina: Secondary | ICD-10-CM

## 2012-08-31 LAB — POCT WET PREP (WET MOUNT)
Clue Cells Wet Prep Whiff POC: POSITIVE
WBC, Wet Prep HPF POC: <5

## 2012-08-31 MED ORDER — FLUCONAZOLE 150 MG PO TABS: 150.0000 mg | ORAL_TABLET | Freq: Once | ORAL | Status: DC

## 2012-08-31 MED ORDER — BORIC ACID CRYS: CRYSTALS | Status: DC

## 2012-08-31 MED ORDER — METRONIDAZOLE 500 MG PO TABS: 500.0000 mg | ORAL_TABLET | Freq: Two times a day (BID) | ORAL | Status: DC

## 2012-08-31 MED ORDER — NAPROXEN 500 MG PO TABS: 500.0000 mg | ORAL_TABLET | Freq: Two times a day (BID) | ORAL | Status: DC | PRN

## 2012-08-31 NOTE — Patient Instructions (Addendum)
Ms. Wilbon,  Thank you for coming intoday. I have refilled naproxen and sent flagyl (metronidazole) to you pharmacy.  I will call you if we can go ahead with the boric acid treatment.   Dr. Armen Pickup    Treatment for recurrent BV: Approximately 30 percent of patients with an initial response to therapy have a recurrence of symptoms within three months and more than 50 percent experience a recurrence within 12 months. We treat symptomatic relapse with a longer course of therapy, using a different antibiotic than that used for the initial episode. For women who prefer preventive therapy instead of treatment of frequent episodes of BV, we suggest metronidazole or tinidazole orally for 7 days; vaginal boric acid 600 mg is begun at the same time and continued for 21 days (Grade 2B). Patients are seen for follow-up a day or two after their last boric acid dose; if they are in remission, we immediately begin metronidazole gel twice weekly for four to six months as suppressive therapy. (See 'Relapse and recurrent infection' above

## 2012-08-31 NOTE — Assessment & Plan Note (Addendum)
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 P: Treat with flagyl followed by diflucan x 1 At the same time, start  vaginal boric acid (rx called [facility], out of pocket cost $22.68) Continue vaginal  boric acid nightly for 3 weeks. F/u in 3 weeks:1-2 days after last dose of vaginal boric acid.  Check for suppression of BV. If suppressed start metrogel twice weekly for 4-6 months.

## 2012-08-31 NOTE — Progress Notes (Signed)
Subjective:     Patient ID: Carla Hodges, female   DOB: Apr 12, 1980, 32 y.o.   MRN: [id number]  HPI 32 yo F presents for f/u visit to discuss the following:  1. Vaginal odor: x one day. No discharge. Itching or irritation. Sexually active with one partner. Irregular condom use. Taking probiotic pill in an effort to prevent BV. 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    Review of Systems As per HPI     Objective:   Physical Exam BP 111/78  Pulse 62  Temp(Src) 98.6 F (37 C) (Oral)  Wt 238 lb (107.956 kg)  BMI 38.99 kg/m2  LMP 07/10/2012 General appearance: alert, cooperative and no distress Pelvic: External: normal external genitalia Vagina: normal mucosa. Thick white discharge in posterior fornix. Cervix: normal without lesions.  Bimanual: no CMT, no fundal mass or tenderness. No adnexal mass or tenderness.  Assessment and Plan:

## 2012-09-14 ENCOUNTER — Encounter (HOSPITAL_COMMUNITY): Payer: Self-pay | Admitting: Emergency Medicine

## 2012-09-14 ENCOUNTER — Emergency Department (HOSPITAL_COMMUNITY)
Admission: EM | Admit: 2012-09-14 | Discharge: 2012-09-14 | Disposition: A | Discharge status: Home / Self Care | Payer: No Typology Code available for payment source | Attending: Emergency Medicine | Admitting: Emergency Medicine

## 2012-09-14 DIAGNOSIS — Z79899 Other long term (current) drug therapy: Secondary | ICD-10-CM | POA: Insufficient documentation

## 2012-09-14 DIAGNOSIS — S0181XA Laceration without foreign body of other part of head, initial encounter: Secondary | ICD-10-CM

## 2012-09-14 DIAGNOSIS — Z87891 Personal history of nicotine dependence: Secondary | ICD-10-CM | POA: Insufficient documentation

## 2012-09-14 DIAGNOSIS — Y92009 Unspecified place in unspecified non-institutional (private) residence as the place of occurrence of the external cause: Secondary | ICD-10-CM | POA: Insufficient documentation

## 2012-09-14 DIAGNOSIS — Z8709 Personal history of other diseases of the respiratory system: Secondary | ICD-10-CM | POA: Insufficient documentation

## 2012-09-14 DIAGNOSIS — W2203XA Walked into furniture, initial encounter: Secondary | ICD-10-CM | POA: Insufficient documentation

## 2012-09-14 DIAGNOSIS — Z91040 Latex allergy status: Secondary | ICD-10-CM | POA: Insufficient documentation

## 2012-09-14 DIAGNOSIS — Y9383 Activity, rough housing and horseplay: Secondary | ICD-10-CM | POA: Insufficient documentation

## 2012-09-14 DIAGNOSIS — S0180XA Unspecified open wound of other part of head, initial encounter: Secondary | ICD-10-CM | POA: Insufficient documentation

## 2012-09-14 DIAGNOSIS — Z8742 Personal history of other diseases of the female genital tract: Secondary | ICD-10-CM | POA: Insufficient documentation

## 2012-09-14 IMAGING — CR DG FINGER MIDDLE 2+V*R*
3 series · 3 of 3 positions shown · non-contrast
Comparison: None

CLINICAL DATA: Finger injury.

RIGHT MIDDLE FINGER 2+V

[x finger pa right]
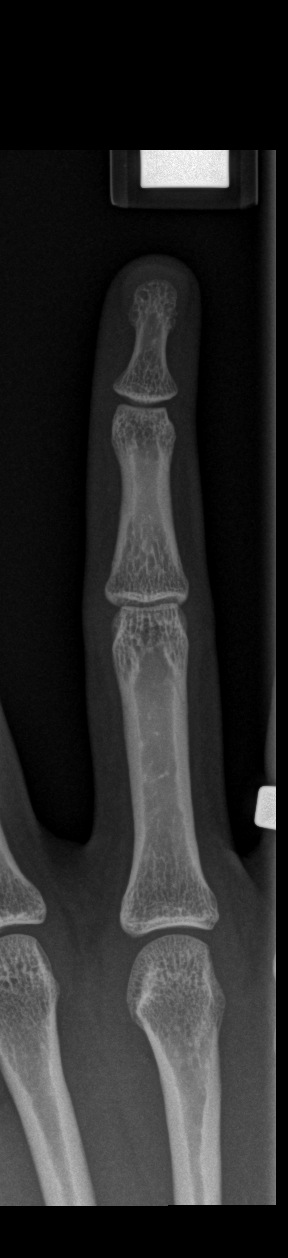

[x finger obl right]
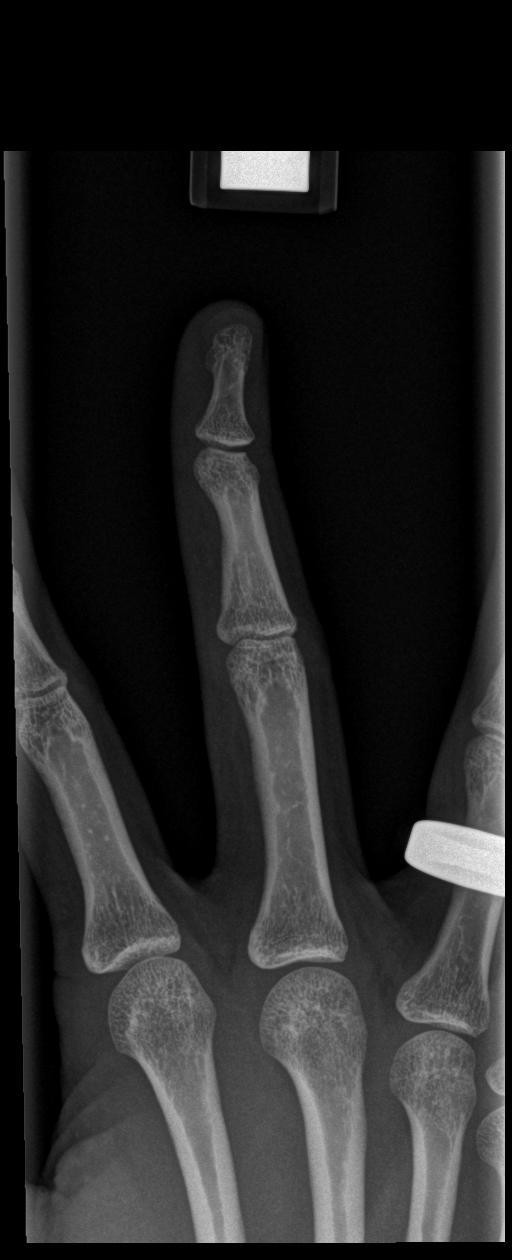

[x finger lat right]
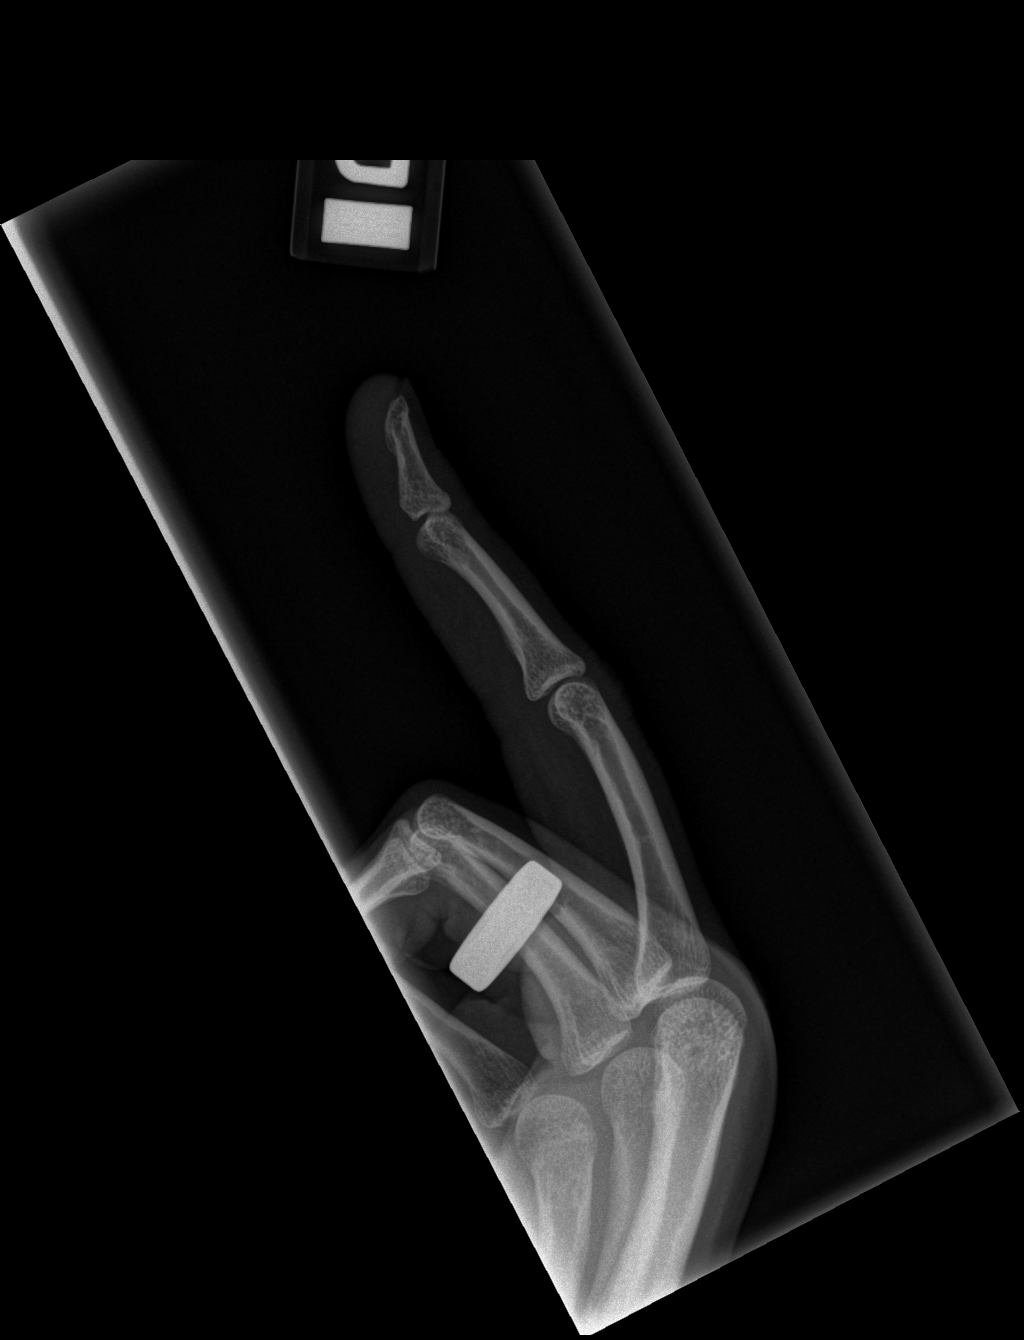

[3 of 3 positions shown; findings below may reference images not displayed]

FINDINGS: There is no evidence of fracture or dislocation.  There
is no evidence of arthropathy or other focal bone abnormality.
Soft tissues are unremarkable.
IMPRESSION: Negative exam.

## 2012-09-14 MED ORDER — NAPROXEN 500 MG PO TABS: 500.0000 mg | ORAL_TABLET | Freq: Two times a day (BID) | ORAL | Status: DC

## 2012-09-14 NOTE — ED Provider Notes (Signed)
Medical screening examination/treatment/procedure(s) were performed by non-physician practitioner and as supervising physician I was immediately available for consultation/collaboration.   Audree Camel, MD 09/14/12 [phone]

## 2012-09-14 NOTE — ED Provider Notes (Signed)
CSN: [id number]     Arrival date & time 09/14/12  1246 History   First MD Initiated Contact with Patient 09/14/12 1353     Chief Complaint  Patient presents with  . Eye Problem  . Laceration   (Consider location/radiation/quality/duration/timing/severity/associated sxs/prior Treatment) HPI Comments: Patient presents with a laceration to the left side of her forehead in the temple region.  She reports that she hit the area on the frame of a door while horse playing with a friend of hers just prior to arrival.  She denies any LOC.  Denies headache, nausea, vomiting, or vision changes.  Denies neck pain.  She is currently not on any blood thinning medications.  No treatment prior to arrival.    The history is provided by the patient.    Past Medical History  Diagnosis Date  . Bronchitis, chronic   . Fibroids    Past Surgical History  Procedure Laterality Date  . Hysteroscopy      2000   Family History  Problem Relation Age of Onset  . Alcohol abuse Neg Hx   . Arthritis Neg Hx   . Asthma Neg Hx   . Birth defects Neg Hx   . Cancer Neg Hx   . COPD Neg Hx   . Depression Neg Hx   . Diabetes Neg Hx     Pt states family is "prone to DM"  . Drug abuse Neg Hx   . Early death Neg Hx   . Hearing loss Neg Hx   . Heart disease Neg Hx   . Hyperlipidemia Neg Hx   . Hypertension Neg Hx   . Kidney disease Neg Hx   . Learning disabilities Neg Hx   . Mental illness Neg Hx   . Mental retardation Neg Hx   . Miscarriages / Stillbirths Neg Hx   . Stroke Neg Hx   . Vision loss Neg Hx    History  Substance Use Topics  . Smoking status: Former Smoker    Quit date: 04/12/2011  . Smokeless tobacco: Not on file  . Alcohol Use: Yes     Comment: cut back and now drinks on weekends   OB History   Grav Para Term Preterm Abortions TAB SAB Ect Mult Living   0              Review of Systems  Eyes: Negative for photophobia, pain and visual disturbance.  Skin: Positive for wound.  All other  systems reviewed and are negative.    Allergies  Latex  Home Medications   Current Outpatient Rx  Name  Route  Sig  Dispense  Refill  . naproxen (NAPROSYN) 500 MG tablet   Oral   Take 1 tablet (500 mg total) by mouth 2 (two) times daily as needed. For pain   60 tablet   0   . traZODone (DESYREL) 50 MG tablet   Oral   Take 50 mg by mouth at bedtime as needed for sleep. From [place]         . Boric Acid CRYS      Place one 600 mg in your vagina nightly for the next 21 nights.   1 Bottle   0    BP 116/83  Pulse 60  Temp(Src) 98.6 F (37 C) (Oral)  Resp 16  SpO2 100%  LMP 07/10/2012 Physical Exam  Nursing note and vitals reviewed. Constitutional: She appears well-developed and well-nourished.  HENT:  Head:    Eyes: EOM are  normal. Pupils are equal, round, and reactive to light.  Neck: Normal range of motion. Neck supple.  Cardiovascular: Normal rate, regular rhythm and normal heart sounds.   Pulmonary/Chest: Effort normal and breath sounds normal.  Neurological: She is alert. She has normal strength. No cranial nerve deficit or sensory deficit. Gait normal.  Skin: Skin is warm and dry.  Psychiatric: She has a normal mood and affect.    ED Course  Procedures (including critical care time) Labs Review Labs Reviewed - No data to display Imaging Review No results found.  MDM  No diagnosis found. Patient presenting with a laceration of the left temple area.  Laceration is very small and superficial.  Laceration repaired with Dermabond. Patient denies LOC.  Denies eye pain or vision changes.  Normal neurological exam.  Patient stable for discharge.    Pascal Lux [place], PA-C 09/14/12 1652

## 2012-09-14 NOTE — Progress Notes (Signed)
[facility] spoke with patient because she has a [employer]. Patient Carla Hodges Card is about to expire. Was able to set her up with a eligibility and enrollment apt for the [facility] Card with her [facility], on 9/16.

## 2012-09-14 NOTE — ED Notes (Signed)
Pt states that she and her friend were horseplaying and she hit her head on the wall.  States that she has a cut above her left eye.

## 2012-09-22 ENCOUNTER — Ambulatory Visit: Payer: Self-pay

## 2012-09-22 ENCOUNTER — Encounter: Payer: Self-pay | Admitting: Family Medicine

## 2012-09-22 ENCOUNTER — Ambulatory Visit (INDEPENDENT_AMBULATORY_CARE_PROVIDER_SITE_OTHER): Payer: Self-pay | Admitting: Family Medicine

## 2012-09-22 VITALS — BP 110/78 | HR 80 | Temp 97.8°F | Ht 65.5 in | Wt 247.0 lb

## 2012-09-22 DIAGNOSIS — J209 Acute bronchitis, unspecified: Secondary | ICD-10-CM | POA: Insufficient documentation

## 2012-09-22 DIAGNOSIS — T798XXA Other early complications of trauma, initial encounter: Secondary | ICD-10-CM

## 2012-09-22 DIAGNOSIS — L089 Local infection of the skin and subcutaneous tissue, unspecified: Secondary | ICD-10-CM

## 2012-09-22 MED ORDER — SULFAMETHOXAZOLE-TRIMETHOPRIM 800-160 MG PO TABS: 1.0000 | ORAL_TABLET | Freq: Two times a day (BID) | ORAL | Status: DC

## 2012-09-22 MED ORDER — NAPROXEN 500 MG PO TABS: 500.0000 mg | ORAL_TABLET | Freq: Two times a day (BID) | ORAL | Status: DC

## 2012-09-22 MED ORDER — ALBUTEROL SULFATE HFA 108 (90 BASE) MCG/ACT IN AERS: 2.0000 | INHALATION_SPRAY | Freq: Four times a day (QID) | RESPIRATORY_TRACT | Status: DC | PRN

## 2012-09-22 NOTE — Progress Notes (Signed)
Patient ID: Carla Hodges, female   DOB: [birth year], 32 y.o.   MRN: [id number]  [facility] Amber M. Hairford, MD Phone: [phone]   Subjective: HPI: Patient is a 32 y.o. female presenting to clinic today for same day appointment.  1. Eye abrasion- Seen in ED on 9/8 for laceration after hitting a wall. EDP used Dermabond. She states there was some pus x 3 days and pain of the area. Pain with pressing around the area. Foul smelling. No changes in vision, headaches or LOC. No fevers. No redness around the area.  2. Wheezing - Does not have history of asthma, but has had bronchitis. Has URI x1 week. Does not smoke currently, but is a passive smoker. Some cough, yellow mucus. She has been using her grandma's breathing treatment which helps some.   History Reviewed: Passive smoker. Health Maintenance: Needs flu shot  ROS: Please see HPI above.  Objective: Office vital signs reviewed. BP 110/78  Pulse 80  Temp(Src) 97.8 F (36.6 C) (Oral)  Ht 5' 5.5" (1.664 m)  Wt 247 lb (112.038 kg)  BMI 40.46 kg/m2  SpO2 97%  LMP 07/10/2012  Physical Examination:  General: Awake, alert. NAD. HEENT: MMM. Scab over left eye brow with frank pus under scab. No surrounding erythema. Wound not fully closed under the scab but does appear to be healing despite infection.  Neck: No masses palpated. No LAD Pulm: Diffuse expiratory wheezing and cough. No focal rhonci Cardio: RRR, no murmurs appreciated Abdomen:+BS, soft, nontender, nondistended Extremities: No edema Neuro: Grossly intact  Assessment: 32 y.o. female with bronchitis and wound infection  Plan: See Problem List and After Visit Summary

## 2012-09-22 NOTE — Assessment & Plan Note (Signed)
Wound infected. Culture obtained. Will treat with Bactrim BID x10 days. F/u in 1 week. Clean wound with soap and water.

## 2012-09-22 NOTE — Assessment & Plan Note (Signed)
No known history of asthma, but exam consistent with bronchitis. Will give Albuterol. On Bactrim for wound infection, which will also cover lungs. F/u in 1 week or sooner if failed to improve.

## 2012-09-22 NOTE — Patient Instructions (Addendum)
Wound Infection  A wound infection happens when a type of germ (bacteria) starts growing in the wound. In some cases, this can cause the wound to break open. If cared for properly, the infected wound will heal from the inside to the outside. Wound infections need treatment.  CAUSES  An infection is caused by bacteria growing in the wound.   SYMPTOMS    Increase in redness, swelling, or pain at the wound site.   Increase in drainage at the wound site.   Wound or bandage (dressing) starts to smell bad.   Fever.   Feeling tired or fatigued.   Pus draining from the wound.  TREATMENT   You caregiver will prescribe antibiotic medicine. The wound infection should improve within 24 to 48 hours. Any redness around the wound should stop spreading and the wound should be less painful.   HOME CARE INSTRUCTIONS    Only take over-the-counter or prescription medicines for pain, discomfort, or fever as directed by your caregiver.   Take your antibiotics as directed. Finish them even if you start to feel better.   Gently wash the area with mild soap and water 2 times a day, or as directed. Rinse off the soap. Pat the area dry with a clean towel. Do not rub the wound. This may cause bleeding.   Follow your caregiver's instructions for how often you need to change the dressing.   Apply ointment and a dressing to the wound as directed.   If the dressing sticks, moisten it with soapy water and gently remove it.   Change the bandage right away if it becomes wet, dirty, or develops a bad smell.   Take showers. Do not take tub baths, swim, or do anything that may soak the wound until it is healed.   Avoid exercises that make you sweat heavily.   Use anti-itch medicine as directed by your caregiver. The wound may itch when it is healing. Do not pick or scratch at the wound.   Follow up with your caregiver to get your wound rechecked as directed.  SEEK MEDICAL CARE IF:   You have an increase in swelling, pain, or redness  around the wound.   You have an increase in the amount of pus coming from the wound.   There is a bad smell coming from the wound.   More of the wound breaks open.   You have a fever.  MAKE SURE YOU:    Understand these instructions.   Will watch your condition.   Will get help right away if you are not doing well or get worse.  Document Released: 09/22/2002 Document Revised: 03/18/2011 Document Reviewed: 04/29/2010  ExitCare Patient Information 2014 [employer].

## 2012-09-23 ENCOUNTER — Ambulatory Visit: Payer: Self-pay

## 2012-09-25 LAB — WOUND CULTURE

## 2012-10-01 ENCOUNTER — Encounter: Payer: Self-pay | Admitting: Family Medicine

## 2012-10-01 ENCOUNTER — Ambulatory Visit (INDEPENDENT_AMBULATORY_CARE_PROVIDER_SITE_OTHER): Payer: PRIVATE HEALTH INSURANCE | Admitting: Family Medicine

## 2012-10-01 VITALS — BP 112/79 | HR 78 | Temp 98.2°F | Wt 249.0 lb

## 2012-10-01 DIAGNOSIS — T799XXS Unspecified early complication of trauma, sequela: Secondary | ICD-10-CM

## 2012-10-01 DIAGNOSIS — A499 Bacterial infection, unspecified: Secondary | ICD-10-CM

## 2012-10-01 DIAGNOSIS — B9689 Other specified bacterial agents as the cause of diseases classified elsewhere: Secondary | ICD-10-CM

## 2012-10-01 DIAGNOSIS — T798XXS Other early complications of trauma, sequela: Secondary | ICD-10-CM

## 2012-10-01 DIAGNOSIS — N760 Acute vaginitis: Secondary | ICD-10-CM

## 2012-10-01 MED ORDER — METRONIDAZOLE 1 % EX GEL
CUTANEOUS | Status: DC
Start: 2012-10-01 — End: 2013-02-08

## 2012-10-01 MED ORDER — METRONIDAZOLE 500 MG PO TABS: 500.0000 mg | ORAL_TABLET | Freq: Three times a day (TID) | ORAL | Status: DC

## 2012-10-01 NOTE — Progress Notes (Signed)
Patient ID: Carla Hodges, female   DOB: [birth year], 32 y.o.   MRN: [id number]  [facility] Muranda Coye M. Brigett Estell, MD Phone: [phone]   Subjective: HPI: Patient is a 32 y.o. female presenting to clinic today for follow up for facial abrasion and vaginal odor.  Vaginitis Patient presents for evaluation of an abnormal vaginal odor. She has recurrent BV and this is the same as her usual symptoms. Symptoms have been present for several days. Vaginal symptoms: odor. Contraception: none. She denies abnormal bleeding, discharge and local irritation. Menstrual flow: regular every 28-30 days.  Abrasion F/u wound infection. Started on Bactrim 09/22/12. Doing much better, no pain or discharge. Still a little swollen but healing up. No current concerns  History Reviewed: Former smoker. Health Maintenance: needs flu shot  ROS: Please see HPI above.  Objective: Office vital signs reviewed. BP 112/79  Pulse 78  Temp(Src) 98.2 F (36.8 C) (Oral)  Wt 249 lb (112.946 kg)  BMI 40.79 kg/m2  LMP 09/09/2012  Physical Examination:  General: Awake, alert. NAD HEENT: Atraumatic, normocephalic. Well healed area on left temple without drainage, erythema or bleeding. Neck: No masses palpated. No LAD Pulm: CTAB, no wheezes Cardio: RRR, no murmurs appreciated Abdomen:+BS, soft, nontender, nondistended GU: Deferred Extremities: No edema Neuro: Grossly intact  Assessment: 32 y.o. female follow up  Plan: See Problem List and After Visit Summary

## 2012-10-01 NOTE — Assessment & Plan Note (Signed)
Recurrent issue. Wet prep deferred. Will treat with Flagyl po. Also given written Rx for metrogel to use for prophylaxis if she is able to afford it. If odor does not resolve with treatment, f/u for pelvic exam.

## 2012-10-01 NOTE — Assessment & Plan Note (Signed)
Resolved. SHe has been on Bactrim for 9 days, which is adequate treatment. D/c Bactrim. Scar reduction information given.

## 2012-10-01 NOTE — Patient Instructions (Addendum)
Bacterial Vaginosis Bacterial vaginosis (BV) is a vaginal infection where the normal balance of bacteria in the vagina is disrupted. The normal balance is then replaced by an overgrowth of certain bacteria. There are several different kinds of bacteria that can cause BV. BV is the most common vaginal infection in women of childbearing age. CAUSES   The cause of BV is not fully understood. BV develops when there is an increase or imbalance of harmful bacteria.  Some activities or behaviors can upset the normal balance of bacteria in the vagina and put women at increased risk including:  Having a new sex partner or multiple sex partners.  Douching.  Using an intrauterine device (IUD) for contraception.  It is not clear what role sexual activity plays in the development of BV. However, women that have never had sexual intercourse are rarely infected with BV. Women do not get BV from toilet seats, bedding, swimming pools or from touching objects around them.  SYMPTOMS   Grey vaginal discharge.  A fish-like odor with discharge, especially after sexual intercourse.  Itching or burning of the vagina and vulva.  Burning or pain with urination.  Some women have no signs or symptoms at all. DIAGNOSIS  Your caregiver must examine the vagina for signs of BV. Your caregiver will perform lab tests and look at the sample of vaginal fluid through a microscope. They will look for bacteria and abnormal cells (clue cells), a pH test higher than 4.5, and a positive amine test all associated with BV.  RISKS AND COMPLICATIONS   Pelvic inflammatory disease (PID).  Infections following gynecology surgery.  Developing HIV.  Developing herpes virus. TREATMENT  Sometimes BV will clear up without treatment. However, all women with symptoms of BV should be treated to avoid complications, especially if gynecology surgery is planned. Female partners generally do not need to be treated. However, BV may spread  between female sex partners so treatment is helpful in preventing a recurrence of BV.   BV may be treated with antibiotics. The antibiotics come in either pill or vaginal cream forms. Either can be used with nonpregnant or pregnant women, but the recommended dosages differ. These antibiotics are not harmful to the baby.  BV can recur after treatment. If this happens, a second round of antibiotics will often be prescribed.  Treatment is important for pregnant women. If not treated, BV can cause a premature delivery, especially for a pregnant woman who had a premature birth in the past. All pregnant women who have symptoms of BV should be checked and treated.  For chronic reoccurrence of BV, treatment with a type of prescribed gel vaginally twice a week is helpful. HOME CARE INSTRUCTIONS   Finish all medication as directed by your caregiver.  Do not have sex until treatment is completed.  Tell your sexual partner that you have a vaginal infection. They should see their caregiver and be treated if they have problems, such as a mild rash or itching.  Practice safe sex. Use condoms. Only have 1 sex partner. PREVENTION  Basic prevention steps can help reduce the risk of upsetting the natural balance of bacteria in the vagina and developing BV:  Do not have sexual intercourse (be abstinent).  Do not douche.  Use all of the medicine prescribed for treatment of BV, even if the signs and symptoms go away.  Tell your sex partner if you have BV. That way, they can be treated, if needed, to prevent reoccurrence. SEEK MEDICAL CARE IF:     Your symptoms are not improving after 3 days of treatment.  You have increased discharge, pain, or fever. MAKE SURE YOU:   Understand these instructions.  Will watch your condition.  Will get help right away if you are not doing well or get worse. FOR MORE INFORMATION  Division of STD Prevention (DSTDP), Centers for Disease Control and Prevention:  www.cdc.gov/std American Social Health Association (ASHA): www.ashastd.org  Document Released: 12/24/2004 Document Revised: 03/18/2011 Document Reviewed: 06/16/2008 ExitCare Patient Information 2014 [employer].  

## 2012-12-23 ENCOUNTER — Encounter: Payer: Self-pay | Admitting: Family Medicine

## 2012-12-23 ENCOUNTER — Other Ambulatory Visit (HOSPITAL_COMMUNITY)
Admission: RE | Admit: 2012-12-23 | Discharge: 2012-12-23 | Disposition: A | Discharge status: Home / Self Care | Payer: PRIVATE HEALTH INSURANCE | Source: Ambulatory Visit | Attending: Family Medicine | Admitting: Family Medicine

## 2012-12-23 ENCOUNTER — Ambulatory Visit (INDEPENDENT_AMBULATORY_CARE_PROVIDER_SITE_OTHER): Payer: PRIVATE HEALTH INSURANCE | Admitting: Family Medicine

## 2012-12-23 ENCOUNTER — Ambulatory Visit: Payer: PRIVATE HEALTH INSURANCE | Admitting: Family Medicine

## 2012-12-23 VITALS — BP 113/78 | HR 116 | Temp 98.4°F | Ht 65.5 in | Wt 283.0 lb

## 2012-12-23 DIAGNOSIS — A499 Bacterial infection, unspecified: Secondary | ICD-10-CM

## 2012-12-23 DIAGNOSIS — B9689 Other specified bacterial agents as the cause of diseases classified elsewhere: Secondary | ICD-10-CM

## 2012-12-23 DIAGNOSIS — Z20828 Contact with and (suspected) exposure to other viral communicable diseases: Secondary | ICD-10-CM

## 2012-12-23 DIAGNOSIS — Z202 Contact with and (suspected) exposure to infections with a predominantly sexual mode of transmission: Secondary | ICD-10-CM

## 2012-12-23 DIAGNOSIS — N760 Acute vaginitis: Secondary | ICD-10-CM

## 2012-12-23 DIAGNOSIS — Z124 Encounter for screening for malignant neoplasm of cervix: Secondary | ICD-10-CM

## 2012-12-23 DIAGNOSIS — Z113 Encounter for screening for infections with a predominantly sexual mode of transmission: Secondary | ICD-10-CM | POA: Insufficient documentation

## 2012-12-23 LAB — POCT WET PREP (WET MOUNT): WBC, Wet Prep HPF POC: <5

## 2012-12-23 MED ORDER — METRONIDAZOLE 500 MG PO TABS: 500.0000 mg | ORAL_TABLET | Freq: Two times a day (BID) | ORAL | Status: DC

## 2012-12-23 NOTE — Assessment & Plan Note (Addendum)
Recurrent issue. Seems to be related to partner's semen (that is the only trigger she can identify.) Wet prep confirms dx. Flagyl BID x10 days with 2 refills if needed. Discussed with preceptor any type of ppx, and patient has not been able to afford any of these. STD testing completed today. Will follow up as needed.

## 2012-12-23 NOTE — Patient Instructions (Signed)
I have refilled your medicine with refills. Try to avoid the triggers if possible. If you continue to have problems, please let us know.  We will see you back as needed!  Amber M. Hairford, M.D.

## 2012-12-23 NOTE — Progress Notes (Signed)
Patient ID: Carla Hodges, female   DOB: 1980-02-23, 32 y.o.   MRN: [id number]    Subjective: HPI: Patient is a 32 y.o. female presenting to clinic today for vaginal discharge.  Vaginitis Patient presents for evaluation of an abnormal vaginal discharge. This is her 15th episode this year for same complaints. Symptoms have been present for 4 days. Vaginal symptoms: discharge described as clear and odor. Contraception: none. She denies abnormal bleeding, dyspareunia, lesions and local irritation. Sexually transmitted infection risk: possible STD exposure. Menstrual flow: regular every 28-30 days. LMP 11/24  History Reviewed: Former smoker. Health Maintenance: UTD on pap (April 2013)  ROS: Please see HPI above.  Objective: Office vital signs reviewed. BP 113/78  Pulse 116  Temp(Src) 98.4 F (36.9 C) (Oral)  Ht 5' 5.5" (1.664 m)  Wt 283 lb (128.368 kg)  BMI 46.36 kg/m2  LMP 11/30/2012  Physical Examination:  General: Awake, alert. NAD HEENT: Atraumatic, normocephalic Abdomen:+BS, soft, nontender, nondistended GU: No external lesions, thin discharge in posterior vaginal vault. No CMT or adnexal tenderness Extremities: No edema Neuro: Grossly intact  Assessment: 32 y.o. female with vaginitis  Plan: See Problem List and After Visit Summary

## 2012-12-24 NOTE — Addendum Note (Signed)
Addended by: Jone Baseman D on: 12/24/2012 09:20 AM   Modules accepted: Orders

## 2012-12-25 ENCOUNTER — Ambulatory Visit: Payer: PRIVATE HEALTH INSURANCE | Admitting: Family Medicine

## 2013-02-08 ENCOUNTER — Emergency Department (HOSPITAL_COMMUNITY)
Admission: EM | Admit: 2013-02-08 | Discharge: 2013-02-08 | Disposition: A | Discharge status: Home / Self Care | Payer: PRIVATE HEALTH INSURANCE | Attending: Emergency Medicine | Admitting: Emergency Medicine

## 2013-02-08 ENCOUNTER — Encounter (HOSPITAL_COMMUNITY): Payer: Self-pay | Admitting: Emergency Medicine

## 2013-02-08 DIAGNOSIS — R21 Rash and other nonspecific skin eruption: Secondary | ICD-10-CM | POA: Insufficient documentation

## 2013-02-08 DIAGNOSIS — Z87891 Personal history of nicotine dependence: Secondary | ICD-10-CM | POA: Insufficient documentation

## 2013-02-08 DIAGNOSIS — N750 Cyst of Bartholin's gland: Secondary | ICD-10-CM

## 2013-02-08 DIAGNOSIS — R209 Unspecified disturbances of skin sensation: Secondary | ICD-10-CM | POA: Insufficient documentation

## 2013-02-08 DIAGNOSIS — N751 Abscess of Bartholin's gland: Secondary | ICD-10-CM | POA: Insufficient documentation

## 2013-02-08 DIAGNOSIS — D259 Leiomyoma of uterus, unspecified: Secondary | ICD-10-CM | POA: Insufficient documentation

## 2013-02-08 DIAGNOSIS — J42 Unspecified chronic bronchitis: Secondary | ICD-10-CM | POA: Insufficient documentation

## 2013-02-08 NOTE — Discharge Instructions (Signed)
Bartholin's Cyst or Abscess Bartholin's glands are small glands located within the folds of skin (labia) along the sides of the lower opening of the vagina (birth canal). A cyst may develop when the duct of the gland becomes blocked. When this happens, fluid that accumulates within the cyst can become infected. This is known as an abscess. The Bartholin gland produces a mucous fluid to lubricate the outside of the vagina during sexual intercourse. SYMPTOMS   Patients with a small cyst may not have any symptoms.  Mild discomfort to severe pain depending on the size of the cyst and if it is infected (abscess).  Pain, redness, and swelling around the lower opening of the vagina.  Painful intercourse.  Pressure in the perineal area.  Swelling of the lips of the vagina (labia).  The cyst or abscess can be on one side or both sides of the vagina. DIAGNOSIS   A large swelling is seen in the lower vagina area by your caregiver.  Painful to touch.  Redness and pain, if it is an abscess. TREATMENT   Sometimes the cyst will go away on its own.  Apply warm wet compresses to the area or take hot sitz baths several times a day.  An incision to drain the cyst or abscess with local anesthesia.  Culture the pus, if it is an abscess.  Antibiotic treatment, if it is an abscess.  Cut open the gland and suture the edges to make the opening of the gland bigger (marsupialization).  Remove the whole gland if the cyst or abscess returns. PREVENTION   Practice good hygiene.  Clean the vaginal area with a mild soap and soft cloth when bathing.  Do not rub hard in the vaginal area when bathing.  Protect the crotch area with a padded cushion if you take long bike rides or ride horses.  Be sure you are well lubricated when you have sexual intercourse. HOME CARE INSTRUCTIONS   If your cyst or abscess was opened, a small piece of gauze, or a drain, may have been placed in the wound to allow  drainage. Do not remove this gauze or drain unless directed by your caregiver.  Wear feminine pads, not tampons, as needed for any drainage or bleeding.  If antibiotics were prescribed, take them exactly as directed. Finish the entire course.  Only take over-the-counter or prescription medicines for pain, discomfort, or fever as directed by your caregiver. SEEK IMMEDIATE MEDICAL CARE IF:   You have an increase in pain, redness, swelling, or drainage.  You have bleeding from the wound which results in the use of more than the number of pads suggested by your caregiver in 24 hours.  You have chills.  You have a fever.  You develop any new problems (symptoms) or aggravation of your existing condition. MAKE SURE YOU:   Understand these instructions.  Will watch your condition.  Will get help right away if you are not doing well or get worse. Document Released: 12/24/2004 Document Revised: 03/18/2011 Document Reviewed: 08/12/2007 ExitCare Patient Information 2014 [employer].  

## 2013-02-08 NOTE — ED Notes (Signed)
MD at bedside. 

## 2013-02-08 NOTE — ED Notes (Signed)
Pt c/o abscess to labia for several days. + drainage.

## 2013-02-08 NOTE — ED Notes (Signed)
MD and Estill Bamberg, ED tech at bedside for I & D

## 2013-02-10 ENCOUNTER — Encounter: Payer: Self-pay | Admitting: Family Medicine

## 2013-02-10 ENCOUNTER — Ambulatory Visit (INDEPENDENT_AMBULATORY_CARE_PROVIDER_SITE_OTHER): Payer: PRIVATE HEALTH INSURANCE | Admitting: Family Medicine

## 2013-02-10 VITALS — BP 97/61 | HR 62 | Temp 97.9°F | Ht 65.5 in | Wt 282.0 lb

## 2013-02-10 DIAGNOSIS — N751 Abscess of Bartholin's gland: Secondary | ICD-10-CM | POA: Insufficient documentation

## 2013-02-10 NOTE — ED Provider Notes (Signed)
CSN: [id number]     Arrival date & time 02/08/13  0102 History   First MD Initiated Contact with Patient 02/08/13 0410     Chief Complaint  Patient presents with  . Abscess   (Consider location/radiation/quality/duration/timing/severity/associated sxs/prior Treatment) HPI Comments: Pt comes in with cc of abscess. Pt has a lesion around the labia for 3 days, now expanding and pain is getting worse. Pt has had some purulent discharge as well. + prior hx of same.  The history is provided by the patient.    Past Medical History  Diagnosis Date  . Bronchitis, chronic   . Fibroids    Past Surgical History  Procedure Laterality Date  . Hysteroscopy      2000   Family History  Problem Relation Age of Onset  . Alcohol abuse Neg Hx   . Arthritis Neg Hx   . Asthma Neg Hx   . Birth defects Neg Hx   . Cancer Neg Hx   . COPD Neg Hx   . Depression Neg Hx   . Diabetes Neg Hx     Pt states family is "prone to DM"  . Drug abuse Neg Hx   . Early death Neg Hx   . Hearing loss Neg Hx   . Heart disease Neg Hx   . Hyperlipidemia Neg Hx   . Hypertension Neg Hx   . Kidney disease Neg Hx   . Learning disabilities Neg Hx   . Mental illness Neg Hx   . Mental retardation Neg Hx   . Miscarriages / Stillbirths Neg Hx   . Stroke Neg Hx   . Vision loss Neg Hx    History  Substance Use Topics  . Smoking status: Former Smoker    Quit date: 04/12/2011  . Smokeless tobacco: Not on file  . Alcohol Use: Yes     Comment: cut back and now drinks on weekends   OB History   Grav Para Term Preterm Abortions TAB SAB Ect Mult Living   0              Review of Systems  Constitutional: Negative for activity change.  Respiratory: Negative for shortness of breath.   Cardiovascular: Negative for chest pain.  Gastrointestinal: Negative for nausea, vomiting and abdominal pain.  Genitourinary: Negative for dysuria.  Musculoskeletal: Negative for neck pain.  Skin: Positive for rash and wound.   Neurological: Negative for headaches.    Allergies  Latex  Home Medications  No current outpatient prescriptions on file. BP 92/55  Pulse 61  Temp(Src) 97.9 F (36.6 C) (Oral)  Resp 16  SpO2 97%  LMP 01/23/2013 Physical Exam  Nursing note and vitals reviewed. Constitutional: She is oriented to person, place, and time. She appears well-developed and well-nourished.  HENT:  Head: Normocephalic and atraumatic.  Eyes: EOM are normal. Pupils are equal, round, and reactive to light.  Neck: Neck supple.  Cardiovascular: Normal rate, regular rhythm and normal heart sounds.   No murmur heard. Pulmonary/Chest: Effort normal. No respiratory distress.  Abdominal: Soft. She exhibits no distension. There is no tenderness. There is no rebound and no guarding.  Genitourinary:  Right sided fluctuant and tender lesion, with no surrounding erythema.  Neurological: She is alert and oriented to person, place, and time.  Skin: Skin is warm and dry.    ED Course  Word catheter placed Date/Time: 02/08/2013 8:37 AM Performed by: Varney Biles Authorized by: Varney Biles Consent: Verbal consent obtained. Risks and benefits: risks, benefits  and alternatives were discussed Consent given by: patient Patient identity confirmed: verbally with patient Time out: Immediately prior to procedure a "time out" was called to verify the correct patient, procedure, equipment, support staff and site/side marked as required. Preparation: Patient was prepped and draped in the usual sterile fashion. Local anesthesia used: yes Anesthesia: local infiltration Local anesthetic: lidocaine 2% without epinephrine Anesthetic total: 3 ml Patient sedated: no Patient tolerance: Patient tolerated the procedure well with no immediate complications.  INCISION AND DRAINAGE Date/Time: 02/08/2013 8:38 AM Performed by: Varney Biles Authorized by: Varney Biles Consent: Verbal consent obtained. Risks and benefits:  risks, benefits and alternatives were discussed Consent given by: patient Required items: required blood products, implants, devices, and special equipment available Patient identity confirmed: verbally with patient Time out: Immediately prior to procedure a "time out" was called to verify the correct patient, procedure, equipment, support staff and site/side marked as required. Type: cyst Body area: anogenital Location details: Bartholin's gland Anesthesia: local infiltration Local anesthetic: lidocaine 2% without epinephrine Anesthetic total: 3 ml Patient sedated: no Scalpel size: 11 Needle gauge: 22 Incision type: single straight Complexity: simple Drainage: purulent Drainage amount: moderate Wound treatment: drain placed Packing material: balloon Patient tolerance: Patient tolerated the procedure well with no immediate complications.   (including critical care time) Labs Review Labs Reviewed - No data to display Imaging Review No results found.  EKG Interpretation   None       MDM   1. Infected cyst of Bartholin's gland duct    PT comes in with infected bartholin cyst. Drained with word catheter placement. Will d.c Asked to see Gyne in 2-3 days.  Varney Biles, MD 02/10/13 [phone]

## 2013-02-10 NOTE — Assessment & Plan Note (Signed)
A: s/p I&D and word catheter placement. Catheter fell out prematurely. Overall improving. We discussed that recurrence in possible since catheter fell out and there was no packing. No abscess re-accumulation on today's exam.  P: For this please start warm water baths. Continue triple antibiotic ointment. Cover lightly while you're up at about. Leave exposed to open air while home.  Avoid sex until completely healed.  You can work out. Avoid cardio until 7 days post incision 02/15/13.  Call and come back if swelling worsens, you develop worsening pain or fever.

## 2013-02-10 NOTE — Progress Notes (Signed)
   Subjective:    Patient ID: Carla Hodges, female    DOB: 07-Jan-1981, 33 y.o.   MRN: [id number]  HPI 33 yo F presents for ED f/u:  1. Bartholin gland cyst: started noticing R labial swelling 02/03/13. Seen in the ED on 02/08/13 . She is s/p I&D and word catheter placement. The catheter fell out the next day. She has been treating with triple antibiotic ointment and wearing a pad. She still had mild pain at incision site, scant drainage, minimal swelling but not as bad. Denies fever. No history of genital herpes.    Soc Hx: non smoker  Review of Systems As per HPI  Noted BP: patient denies dizziness, lightheadedness, fatigue     Objective:   Physical Exam BP 97/61  Pulse 62  Temp(Src) 97.9 F (36.6 C) (Oral)  Ht 5' 5.5" (1.664 m)  Wt 282 lb (127.914 kg)  BMI 46.20 kg/m2  LMP 01/23/2013 General appearance: alert, cooperative and no distress Pelvic:  R labia: mild swelling, with superficial ulceration (presumed I&D site). Scant serosanguineous drainage. Induration measuring 1x1.5 cm. No fluctuance.  No other lesions.        Assessment & Plan:

## 2013-02-10 NOTE — Patient Instructions (Signed)
Ms. Ebers,  Thank you for coming in today. You still have some induration (hard swelling) but signs of abscess (squishy swelling indicated pus collection).  For this please start warm water baths.  Continue triple antibiotic ointment. Cover lightly while you're up at about. Leave exposed to open air while home.  Avoid sex until completely healed.  You can work out. Avoid cardio until 7 days post incision 02/15/13.  Call and come back if swelling worsens, you develop worsening pain or fever.  Dr. Adrian Blackwater

## 2013-03-16 ENCOUNTER — Other Ambulatory Visit (HOSPITAL_COMMUNITY)
Admission: RE | Admit: 2013-03-16 | Discharge: 2013-03-16 | Disposition: A | Discharge status: Home / Self Care | Payer: PRIVATE HEALTH INSURANCE | Source: Ambulatory Visit | Attending: Family Medicine | Admitting: Family Medicine

## 2013-03-16 ENCOUNTER — Encounter: Payer: Self-pay | Admitting: Family Medicine

## 2013-03-16 ENCOUNTER — Ambulatory Visit (INDEPENDENT_AMBULATORY_CARE_PROVIDER_SITE_OTHER): Payer: PRIVATE HEALTH INSURANCE | Admitting: Family Medicine

## 2013-03-16 VITALS — BP 92/65 | HR 58 | Temp 98.4°F | Ht 65.5 in | Wt 272.0 lb

## 2013-03-16 DIAGNOSIS — Z113 Encounter for screening for infections with a predominantly sexual mode of transmission: Secondary | ICD-10-CM | POA: Insufficient documentation

## 2013-03-16 DIAGNOSIS — N760 Acute vaginitis: Secondary | ICD-10-CM

## 2013-03-16 DIAGNOSIS — A499 Bacterial infection, unspecified: Secondary | ICD-10-CM

## 2013-03-16 DIAGNOSIS — N898 Other specified noninflammatory disorders of vagina: Secondary | ICD-10-CM

## 2013-03-16 DIAGNOSIS — B9689 Other specified bacterial agents as the cause of diseases classified elsewhere: Secondary | ICD-10-CM

## 2013-03-16 LAB — POCT WET PREP (WET MOUNT)
CLUE CELLS WET PREP WHIFF POC: POSITIVE
WBC, Wet Prep HPF POC: <5

## 2013-03-16 MED ORDER — METRONIDAZOLE 500 MG PO TABS
500.0000 mg | ORAL_TABLET | Freq: Two times a day (BID) | ORAL | Status: DC
Start: 2013-03-16 — End: 2013-06-04

## 2013-03-16 NOTE — Patient Instructions (Signed)
You have BV again No sign of yeast Please take the Metro for the full 7 days We will call you if anything else comes up positive

## 2013-03-16 NOTE — Assessment & Plan Note (Signed)
Start metro again for full 7 day course

## 2013-03-16 NOTE — Progress Notes (Signed)
Carla Hodges is a 33 y.o. female who presents to [facility] today for vaginal discharge  Vaginal discharge: started last week. Discharge is white. nonitchy or irritated. Denies any recent ABX or new sexual partner. Started flagyl a couple of days ago x2 but then stopped. Denies fever, lower abdominal pain, dysuria. Partner may be sexually active with other wormen  The following portions of the patient's history were reviewed and updated as appropriate: allergies, current medications, past medical history, family and social history, and problem list.  Patient is a nonsmoker.   Past Medical History  Diagnosis Date  . Bronchitis, chronic   . Fibroids     ROS as above otherwise neg.    Medications reviewed. No current outpatient prescriptions on file.   No current facility-administered medications for this visit.    Exam:  BP 92/65  Pulse 58  Temp(Src) 98.4 F (36.9 C) (Oral)  Ht 5' 5.5" (1.664 m)  Wt 272 lb (123.378 kg)  BMI 44.56 kg/m2  LMP 02/22/2013 Gen: Well NAD HEENT: EOMI,  MMM GU: labia nml, vaginal walls w/ thick white discharge, no cervical motion tenderness  Results for orders placed in visit on 03/16/13 (from the past 72 hour(s))  POCT WET PREP (WET [place])     Status: Abnormal   Collection Time    03/16/13  2:28 PM      Result Value Ref Range   Source Wet Prep POC VAG     WBC, Wet Prep HPF POC <5     Bacteria Wet Prep HPF POC 3+ COCCI & RODS     Clue Cells Wet Prep HPF POC Many     CLUE CELLS WET PREP WHIFF POC Positive Whiff     Yeast Wet Prep HPF POC None     Trichomonas Wet Prep HPF POC None      A/P (as seen in Problem list)  BV (bacterial vaginosis) Start metro again for full 7 day course

## 2013-03-17 LAB — CERVICOVAGINAL ANCILLARY ONLY
Chlamydia: NEGATIVE
Neisseria Gonorrhea: NEGATIVE

## 2013-04-26 ENCOUNTER — Encounter: Payer: Self-pay | Admitting: Family Medicine

## 2013-04-26 ENCOUNTER — Ambulatory Visit (INDEPENDENT_AMBULATORY_CARE_PROVIDER_SITE_OTHER): Payer: PRIVATE HEALTH INSURANCE | Admitting: Family Medicine

## 2013-04-26 VITALS — BP 118/87 | HR 56 | Temp 97.7°F | Ht 65.5 in | Wt 257.0 lb

## 2013-04-26 DIAGNOSIS — N751 Abscess of Bartholin's gland: Secondary | ICD-10-CM

## 2013-04-26 DIAGNOSIS — H00019 Hordeolum externum unspecified eye, unspecified eyelid: Secondary | ICD-10-CM

## 2013-04-26 MED ORDER — CEPHALEXIN 500 MG PO CAPS: 500.0000 mg | ORAL_CAPSULE | Freq: Three times a day (TID) | ORAL | Status: DC

## 2013-04-26 NOTE — Assessment & Plan Note (Signed)
A: Mild/early abscess. This is recurrent for patient.  P: - Since early and not too large, will treat with Keflex TID x1 week. If not improved or worsens, will need to I&D.  - Given red flags that would prompt her return

## 2013-04-26 NOTE — Progress Notes (Signed)
Patient ID: Carla Hodges, female   DOB: 1980/07/12, 33 y.o.   MRN: [id number]    Subjective: HPI: Patient is a 33 y.o. female presenting to clinic today for clinic visit. Concerns today include swollen eye and vaginal pain  1. Right eye swollen- 4 days ago, started having pain in nasal corner of eye. Started having swelling of eye lid. Reports pain and itching of eye. Never had anything like this before. No redness of eye. No injury. No known allergy exposure.  2. Vaginal pain- Feels like recurrent abscess. Noticed it within the last few days. No drainage. No vaginal discharge; currently on her period. No fevers. Only reports pain.   History Reviewed: Daily smoker. Health Maintenance: UTD  ROS: Please see HPI above.  Objective: Office vital signs reviewed. BP 118/87  Pulse 56  Temp(Src) 97.7 F (36.5 C) (Oral)  Ht 5' 5.5" (1.664 m)  Wt 257 lb (116.574 kg)  BMI 42.10 kg/m2  Physical Examination:  General: Awake, alert. NAD HEENT: Atraumatic, normocephalic. Right eye lid with significant swelling and mild erythema on nasal side. No obvious pore or drainage. No scleral injection. Fluorescein stain exam negative. EOMI.   Pulm: CTAB, no wheezes Cardio: RRR, no murmurs appreciated Abdomen:+BS, soft, nontender, nondistended GU: Left labia majora with mild erythema and 1cm firm nodule with tenderness to palpation. Not draining currently.  Extremities: No edema Neuro: Strength and sensation grossly intact  Assessment: 33 y.o. female office visit.  Plan: See Problem List and After Visit Summary

## 2013-04-26 NOTE — Assessment & Plan Note (Signed)
A: Exam most consistent with sty. No s/sx of cellulitis.  P: - Conservative treatment with warm compresses - On Abx for other abscess so would be covered if cellulitis were to develop - If not improved within next 4 days, may need to see Optho for possible drainage. Pt agrees.

## 2013-04-26 NOTE — Patient Instructions (Signed)
Sty A sty (hordeolum) is an infection of a gland in the eyelid located at the base of the eyelash. A sty may develop a white or yellow head of pus. It can be puffy (swollen). Usually, the sty will burst and pus will come out on its own. They do not leave lumps in the eyelid once they drain. A sty is often confused with another form of cyst of the eyelid called a chalazion. Chalazions occur within the eyelid and not on the edge where the bases of the eyelashes are. They often are red, sore and then form firm lumps in the eyelid. CAUSES   Germs (bacteria).  Lasting (chronic) eyelid inflammation. SYMPTOMS   Tenderness, redness and swelling along the edge of the eyelid at the base of the eyelashes.  Sometimes, there is a white or yellow head of pus. It may or may not drain. DIAGNOSIS  An ophthalmologist will be able to distinguish between a sty and a chalazion and treat the condition appropriately.  TREATMENT   Styes are typically treated with warm packs (compresses) until drainage occurs.  In rare cases, medicines that kill germs (antibiotics) may be prescribed. These antibiotics may be in the form of drops, cream or pills.  If a hard lump has formed, it is generally necessary to do a small incision and remove the hardened contents of the cyst in a minor surgical procedure done in the office.  In suspicious cases, your caregiver may send the contents of the cyst to the lab to be certain that it is not a rare, but dangerous form of cancer of the glands of the eyelid. HOME CARE INSTRUCTIONS   Wash your hands often and dry them with a clean towel. Avoid touching your eyelid. This may spread the infection to other parts of the eye.  Apply heat to your eyelid for 10 to 20 minutes, several times a day, to ease pain and help to heal it faster.  Do not squeeze the sty. Allow it to drain on its own. Wash your eyelid carefully 3 to 4 times per day to remove any pus. SEEK IMMEDIATE MEDICAL CARE IF:    Your eye becomes painful or puffy (swollen).  Your vision changes.  Your sty does not drain by itself within 3 days.  Your sty comes back within a short period of time, even with treatment.  You have redness (inflammation) around the eye.  You have a fever. Document Released: 10/03/2004 Document Revised: 03/18/2011 Document Reviewed: 06/07/2008 Exit[employer] Patient Information 2014 [employer].  

## 2013-06-04 ENCOUNTER — Ambulatory Visit (INDEPENDENT_AMBULATORY_CARE_PROVIDER_SITE_OTHER): Payer: PRIVATE HEALTH INSURANCE | Admitting: Family Medicine

## 2013-06-04 ENCOUNTER — Encounter: Payer: Self-pay | Admitting: Family Medicine

## 2013-06-04 VITALS — BP 103/70 | HR 85 | Temp 98.4°F | Ht 65.5 in | Wt 249.0 lb

## 2013-06-04 DIAGNOSIS — N760 Acute vaginitis: Secondary | ICD-10-CM

## 2013-06-04 DIAGNOSIS — R102 Pelvic and perineal pain: Secondary | ICD-10-CM | POA: Insufficient documentation

## 2013-06-04 DIAGNOSIS — N898 Other specified noninflammatory disorders of vagina: Secondary | ICD-10-CM

## 2013-06-04 DIAGNOSIS — N949 Unspecified condition associated with female genital organs and menstrual cycle: Secondary | ICD-10-CM

## 2013-06-04 DIAGNOSIS — G8929 Other chronic pain: Secondary | ICD-10-CM

## 2013-06-04 DIAGNOSIS — A499 Bacterial infection, unspecified: Secondary | ICD-10-CM

## 2013-06-04 DIAGNOSIS — B9689 Other specified bacterial agents as the cause of diseases classified elsewhere: Secondary | ICD-10-CM

## 2013-06-04 LAB — POCT WET PREP (WET MOUNT): CLUE CELLS WET PREP WHIFF POC: POSITIVE

## 2013-06-04 MED ORDER — METRONIDAZOLE 500 MG PO TABS: 500.0000 mg | ORAL_TABLET | Freq: Two times a day (BID) | ORAL | Status: DC

## 2013-06-04 MED ORDER — HYDROCODONE-ACETAMINOPHEN 10-325 MG PO TABS: 1.0000 | ORAL_TABLET | Freq: Three times a day (TID) | ORAL | Status: DC | PRN

## 2013-06-04 NOTE — Patient Instructions (Signed)
We will get an ultrasound and then decide if you should see the Gyn. Take the antibiotic, I have sent in a refill for you.  I will see you back as needed.  Dayn Barich M. Maigan Bittinger, M.D.

## 2013-06-04 NOTE — Assessment & Plan Note (Signed)
A: Chronic pelvic pain. Known fibroids on ultrasound but pain was more in adnexa  P: - Ultrasound ordered - Will refer to gyn based on results - Patient aware that we do not do chronic pain medication and she should not take other people's pills. I have given her VIcodin #10 but I will not be prescribing any additional pills - F/u prn

## 2013-06-04 NOTE — Progress Notes (Signed)
Patient ID: Herminio Heads, female   DOB: 1980/05/23, 33 y.o.   MRN: [id number]    Subjective: HPI: Patient is a 33 y.o. female presenting to clinic today for follow up appointment.  1. Fibroids- Having abdominal pain. She has been taking Narpoxen. Others have given her other pills (Vicodin, percocet, morphine, dilaudid) which does help. Some vaginal bleeding/spotting. Last period was last month which was heavy and longer than usual.  2. Vaginal odor- Patient without discharge. Has recurrent BV so started taking Flagyl a few days ago. No pain with intercourse. No new detergents or soaps.     History Reviewed: Some day smoker.  ROS: Please see HPI above.  Objective: Office vital signs reviewed. BP 103/70  Pulse 85  Temp(Src) 98.4 F (36.9 C) (Oral)  Ht 5' 5.5" (1.664 m)  Wt 249 lb (112.946 kg)  BMI 40.79 kg/m2  Physical Examination:  General: Awake, alert. NAD HEENT: Atraumatic, normocephalic Abd: Soft, mild TTP on left adnexa on bimanual exam GU: Normal external genitalia, no discharge noted in vagina. No odor.  Assessment: 33 y.o. female follow up  Plan: See Problem List and After Visit Summary

## 2013-06-04 NOTE — Assessment & Plan Note (Signed)
A: Recurrent BV  P: - Flagyl x1 week - Avoid irritation - Avoid soaps/lotions

## 2013-06-09 ENCOUNTER — Ambulatory Visit (HOSPITAL_COMMUNITY)
Admission: RE | Admit: 2013-06-09 | Discharge: 2013-06-09 | Disposition: A | Discharge status: Home / Self Care | Payer: PRIVATE HEALTH INSURANCE | Source: Ambulatory Visit | Attending: Family Medicine | Admitting: Family Medicine

## 2013-06-09 ENCOUNTER — Telehealth: Payer: Self-pay | Admitting: Family Medicine

## 2013-06-09 DIAGNOSIS — G8929 Other chronic pain: Secondary | ICD-10-CM

## 2013-06-09 DIAGNOSIS — N854 Malposition of uterus: Secondary | ICD-10-CM | POA: Insufficient documentation

## 2013-06-09 DIAGNOSIS — D251 Intramural leiomyoma of uterus: Secondary | ICD-10-CM | POA: Insufficient documentation

## 2013-06-09 DIAGNOSIS — R102 Pelvic and perineal pain: Secondary | ICD-10-CM

## 2013-06-09 DIAGNOSIS — N898 Other specified noninflammatory disorders of vagina: Secondary | ICD-10-CM

## 2013-06-09 DIAGNOSIS — N949 Unspecified condition associated with female genital organs and menstrual cycle: Secondary | ICD-10-CM | POA: Insufficient documentation

## 2013-06-09 NOTE — Telephone Encounter (Signed)
Can you please let Carla Hodges know that her fibroid is slightly larger. She does not have any ovarian cysts or other abnormalities in her pelvis. I am not 100% sure the fibroid is causing all of her pain, but it can certainly cause bleeding. I would recommend she follow up with the gynecologists at [facility] to discuss this.  Thanks, [employer]. Sherree Shankman, M.D.

## 2013-06-10 NOTE — Telephone Encounter (Signed)
Pt is aware and will schedule an appt to follow up on this with women's.

## 2013-07-08 ENCOUNTER — Encounter: Payer: Self-pay | Admitting: Obstetrics and Gynecology

## 2013-07-08 ENCOUNTER — Ambulatory Visit (INDEPENDENT_AMBULATORY_CARE_PROVIDER_SITE_OTHER): Payer: Self-pay | Admitting: Obstetrics and Gynecology

## 2013-07-08 VITALS — BP 101/75 | HR 83 | Temp 97.9°F | Ht 65.5 in | Wt 263.7 lb

## 2013-07-08 DIAGNOSIS — R102 Pelvic and perineal pain: Secondary | ICD-10-CM

## 2013-07-08 DIAGNOSIS — N949 Unspecified condition associated with female genital organs and menstrual cycle: Secondary | ICD-10-CM

## 2013-07-08 DIAGNOSIS — N760 Acute vaginitis: Secondary | ICD-10-CM

## 2013-07-08 DIAGNOSIS — D251 Intramural leiomyoma of uterus: Secondary | ICD-10-CM

## 2013-07-08 MED ORDER — OXYCODONE-ACETAMINOPHEN 5-325 MG PO TABS: 1.0000 | ORAL_TABLET | ORAL | Status: DC | PRN

## 2013-07-08 NOTE — Progress Notes (Signed)
Subjective:    Patient ID: Carla Hodges, female    DOB: Jan 15, 1980, 33 y.o.   MRN: [id number]  HPI 33 yo G0 with h/o pelvic pain for 1 year presenting today for evaluation. Patient was diagnosed with fibroid uterus last year and attributes all her pain to the fibroid. She describes monthly menses lasting 5 days with the first 2 days associated with dysmenorrhea. Patient describes the pain as localized in her lower pelvic region. It is non-radiating and characterized as cramps. Patient states that narcotics alleviate her pain and a diet rich in sugar aggravates her pain. She denies diarrhea/constipation. Patient is sexually active not using any contraception.  Past Medical History  Diagnosis Date  . Bronchitis, chronic   . Fibroids    Past Surgical History  Procedure Laterality Date  . Hysteroscopy      2000   Family History  Problem Relation Age of Onset  . Alcohol abuse Neg Hx   . Arthritis Neg Hx   . Asthma Neg Hx   . Birth defects Neg Hx   . Cancer Neg Hx   . COPD Neg Hx   . Depression Neg Hx   . Diabetes Neg Hx     Pt states family is "prone to DM"  . Drug abuse Neg Hx   . Early death Neg Hx   . Hearing loss Neg Hx   . Heart disease Neg Hx   . Hyperlipidemia Neg Hx   . Hypertension Neg Hx   . Kidney disease Neg Hx   . Learning disabilities Neg Hx   . Mental illness Neg Hx   . Mental retardation Neg Hx   . Miscarriages / Stillbirths Neg Hx   . Stroke Neg Hx   . Vision loss Neg Hx    History  Substance Use Topics  . Smoking status: Former Smoker    Quit date: 04/12/2011  . Smokeless tobacco: Not on file  . Alcohol Use: Yes     Comment: cut back and now drinks on weekends      Review of Systems  All other systems reviewed and are negative.      Objective:   Physical Exam  GENERAL: Well-developed, well-nourished female in no acute distress.  Obese ABDOMEN: Soft, nontender, nondistended. No organomegaly. Obese PELVIC: Normal external female  genitalia. Vagina is pink and rugated.  Normal discharge. Normal appearing cervix. Uterus is normal in size, retroverted. No adnexal mass . Positive tenderness in left adnexal region EXTREMITIES: No cyanosis, clubbing, or edema, 2+ distal pulses.  06/2013 US FINDINGS:  Uterus  Measurements: 7.7 x 5.6 x 5.0 cm. Retroverted, retroflexed posterior  uterine body/ fundal intramural fibroid with mass effect upon the  adjacent endometrium measures 3.2 x 2.6 x 2.5 cm. A submucosal  component could appear similar.  Endometrium  Thickness: 1 cm, translaminar in appearance. No focal abnormality  visualized.  Right ovary  Measurements: 4.0 x 1.8 x 1.8 cm. Normal appearance/no adnexal mass.  Left ovary  Measurements: 4.7 x 2.8 x 3.5 cm. Normal appearance/no adnexal mass.  Other findings  Trace free fluid in the cul-de-sac  IMPRESSION:  Increase in size of posterior uterine body/ fundal intramural  fibroid producing mass effect on the endometrium versus small  submucosal component. No acute abnormality.      Assessment & Plan:  33 yo with chronic pelvic pain  - Discussed with patient that fibroid is too small for myomectomy - Offered medical management with Mirena IUD. Patient with unprotected  intercourse in the past 5 days. Will have patient return for IUD insertion - Wet prep and cultures collected per patient request - Rx 20 tabs of percocet provided. Patient was informed that refill would not be provided

## 2013-07-09 ENCOUNTER — Other Ambulatory Visit: Payer: Self-pay | Admitting: Obstetrics and Gynecology

## 2013-07-09 LAB — WET PREP, GENITAL
Trich, Wet Prep: NONE SEEN
WBC, Wet Prep HPF POC: NONE SEEN
Yeast Wet Prep HPF POC: NONE SEEN

## 2013-07-09 MED ORDER — METRONIDAZOLE 500 MG PO TABS: 500.0000 mg | ORAL_TABLET | Freq: Two times a day (BID) | ORAL | Status: DC

## 2013-07-10 LAB — GC/CHLAMYDIA PROBE AMP
CT Probe RNA: NEGATIVE
GC Probe RNA: NEGATIVE

## 2013-07-12 ENCOUNTER — Telehealth: Payer: Self-pay | Admitting: *Deleted

## 2013-07-12 NOTE — Telephone Encounter (Signed)
Patient informed of results.  

## 2013-07-12 NOTE — Telephone Encounter (Addendum)
Message copied by Langston Reusing on Mon Jul 12, 2013 10:38 AM ------      Message from: Mora Bellman      Created: Fri Jul 09, 2013 10:32 AM       Please inform patient of positive BV. Flagyl e-prescribed  ------ Called pt and left message stating that I am calling with test result information. Please call back and state whether we may leave the details on your voice mail. **Note: also inform pt that GC/Chlamydia was negative.

## 2013-07-12 NOTE — Telephone Encounter (Signed)
Patient called back and stated that she is returning our call. It is not okay to leave detailed message.

## 2013-08-04 ENCOUNTER — Encounter: Payer: Self-pay | Admitting: General Practice

## 2013-08-04 ENCOUNTER — Telehealth: Payer: Self-pay | Admitting: General Practice

## 2013-08-04 ENCOUNTER — Ambulatory Visit: Payer: PRIVATE HEALTH INSURANCE | Admitting: Obstetrics and Gynecology

## 2013-08-04 NOTE — Telephone Encounter (Signed)
Patient no showed for appt today. Called patient, no answer- left message that we are trying to reach you because it appears you missed an appt with [place] today, please call our front office to reschedule. Will send letter

## 2013-09-30 ENCOUNTER — Other Ambulatory Visit (HOSPITAL_COMMUNITY)
Admission: RE | Admit: 2013-09-30 | Discharge: 2013-09-30 | Disposition: A | Discharge status: Home / Self Care | Payer: PRIVATE HEALTH INSURANCE | Source: Ambulatory Visit | Attending: Family Medicine | Admitting: Family Medicine

## 2013-09-30 ENCOUNTER — Ambulatory Visit (INDEPENDENT_AMBULATORY_CARE_PROVIDER_SITE_OTHER): Payer: PRIVATE HEALTH INSURANCE | Admitting: Family Medicine

## 2013-09-30 ENCOUNTER — Encounter: Payer: Self-pay | Admitting: Family Medicine

## 2013-09-30 ENCOUNTER — Ambulatory Visit: Payer: PRIVATE HEALTH INSURANCE | Admitting: Family Medicine

## 2013-09-30 VITALS — BP 110/72 | HR 80 | Ht 65.5 in | Wt 259.0 lb

## 2013-09-30 DIAGNOSIS — N760 Acute vaginitis: Secondary | ICD-10-CM

## 2013-09-30 DIAGNOSIS — N898 Other specified noninflammatory disorders of vagina: Secondary | ICD-10-CM

## 2013-09-30 DIAGNOSIS — A499 Bacterial infection, unspecified: Secondary | ICD-10-CM

## 2013-09-30 DIAGNOSIS — B9689 Other specified bacterial agents as the cause of diseases classified elsewhere: Secondary | ICD-10-CM

## 2013-09-30 DIAGNOSIS — Z113 Encounter for screening for infections with a predominantly sexual mode of transmission: Secondary | ICD-10-CM | POA: Diagnosis present

## 2013-09-30 DIAGNOSIS — R062 Wheezing: Secondary | ICD-10-CM

## 2013-09-30 LAB — POCT WET PREP (WET MOUNT): Clue Cells Wet Prep Whiff POC: POSITIVE

## 2013-09-30 MED ORDER — METRONIDAZOLE 500 MG PO TABS: 500.0000 mg | ORAL_TABLET | Freq: Two times a day (BID) | ORAL | Status: DC

## 2013-09-30 NOTE — Progress Notes (Signed)
Patient ID: Carla Hodges, female   DOB: [birth year], 33 y.o.   MRN: [id number] Subjective:   CC: Bad odor, wheezing  HPI:   Vaginal discharge 33 y.o. female with h/o pelvic pain, obesity, GERD, depression, BV, and bartholin's gland abscess presenting to same day clinic for vaginal odor. Frequent wet preps seen in system. Clue cells in July. Current symptoms present about 1 week, getting stronger. Smells like prior episodes of BV. No other symptoms including pain, itching, dyspareunia, dysuria, fevers or chills, new abdominal pain, or new changed bleeding. Has not taken note of if discharge is changed from normal but does not think so.  Still has fibroid that causes pelvic pain. Drinks plenty of liquids so peeing lot with no dysuria. Has some spotting in between periods that she attributes to her fibroids.  Wheezing Reports this symptom started last Thursday. Had a dry cough at that time. Friday all day was coughing "really bad." Friday night "body shut down" with stuffy and runny nose, unilateral head congestion, and wheezing. Took breathing treatment from her grandmother. She got up the following afternoon at [facility] and really felt better. Not eating or drinking well at that time. Congested, but denies dyspnea. Currently, feels nearly totally better.   Review of Systems - Per HPI.   PMH: Reviewed No dx of asthma Small fibroids diagnosed by Dr Elly Modena 07/2013 but reported to small to remove surgically SH: Nonsmoker  Objective:  Physical Exam BP 110/72  Pulse 80  Ht 5' 5.5" (1.664 m)  Wt 259 lb (117.482 kg)  BMI 42.43 kg/m2  LMP 09/05/2013 GEN: NAD CV: RRR, no m/r/g PULM: CTAB, normal effort ABD: obese, soft, nontender, nondistended GU: Normal vagina and cervix, normal uterine mobility and size, mild left pelvic tenderness to palpation, no CMT, thin white discharge    Assessment:     Carla Hodges is a 33 y.o. female here for vaginal discharge and resolved wheezing.    Plan:      Vaginal discharge Symptoms consistent with BV. Wet prep done today positive for clue cells and positive whiff. Negative for yeast or trichomonas. - Flagyl BID x 7 days. Discussed refraining from EtOH. - F/u GC/Chlamydia - Discussed changing panty liners and cotton underwear frequently and staying away from scented products. - Return precautions reviewed.  Resolved wheezing Symptoms resolved today. Consistent with URI with possible mild reactive airways ?improved with breathing treatment vs time. Afebrile with normal VS today. - Return if any further wheezing episode or dyspnea. - Continue thinking about flu shot.  # Health Maintenance:  - Declined flu shot today.  Follow-up: Follow up PRN for if symptoms do not improve or worsen.   Hilton Sinclair, MD [place]

## 2013-09-30 NOTE — Assessment & Plan Note (Signed)
Symptoms consistent with BV. Wet prep done today positive for clue cells and positive whiff. Negative for yeast or trichomonas. - Flagyl BID x 7 days. Discussed refraining from EtOH. - F/u GC/Chlamydia - Discussed changing panty liners and cotton underwear frequently and staying away from scented products. - Return precautions reviewed.

## 2013-09-30 NOTE — Patient Instructions (Signed)
Your discharge is either normal or a mild vaginal infection. We got labs today to test for this. I will call you if the labs that take a few days are NOT normal. Change panty liner and underwear more than once daily to see if this helps avoid infection or odor.  Stay away from scented products as they can irritate.  Your breathing sounds normal. This episode you describe was likely a cold. If you have wheezing again, seek immediate evaluation, or if you have trouble breathing. Continue thinking about the flu shot.  Discuss weight loss with your primary doctor.  Hilton Sinclair, MD

## 2013-09-30 NOTE — Assessment & Plan Note (Signed)
Symptoms resolved today. Consistent with URI with possible mild reactive airways ?improved with breathing treatment vs time. Afebrile with normal VS today. - Return if any further wheezing episode or dyspnea. - Continue thinking about flu shot.

## 2013-10-01 LAB — CERVICOVAGINAL ANCILLARY ONLY
Chlamydia: NEGATIVE
Neisseria Gonorrhea: NEGATIVE

## 2013-11-16 ENCOUNTER — Other Ambulatory Visit (HOSPITAL_COMMUNITY)
Admission: RE | Admit: 2013-11-16 | Discharge: 2013-11-16 | Disposition: A | Discharge status: Home / Self Care | Payer: PRIVATE HEALTH INSURANCE | Source: Ambulatory Visit | Attending: Family Medicine | Admitting: Family Medicine

## 2013-11-16 ENCOUNTER — Encounter: Payer: Self-pay | Admitting: Family Medicine

## 2013-11-16 ENCOUNTER — Ambulatory Visit (INDEPENDENT_AMBULATORY_CARE_PROVIDER_SITE_OTHER): Payer: PRIVATE HEALTH INSURANCE | Admitting: Family Medicine

## 2013-11-16 VITALS — BP 93/68 | HR 74 | Temp 98.2°F | Wt 245.0 lb

## 2013-11-16 DIAGNOSIS — B9689 Other specified bacterial agents as the cause of diseases classified elsewhere: Secondary | ICD-10-CM

## 2013-11-16 DIAGNOSIS — Z113 Encounter for screening for infections with a predominantly sexual mode of transmission: Secondary | ICD-10-CM | POA: Diagnosis present

## 2013-11-16 DIAGNOSIS — N898 Other specified noninflammatory disorders of vagina: Secondary | ICD-10-CM

## 2013-11-16 DIAGNOSIS — N760 Acute vaginitis: Secondary | ICD-10-CM

## 2013-11-16 DIAGNOSIS — A499 Bacterial infection, unspecified: Secondary | ICD-10-CM

## 2013-11-16 LAB — POCT WET PREP (WET MOUNT): CLUE CELLS WET PREP WHIFF POC: POSITIVE

## 2013-11-16 MED ORDER — METRONIDAZOLE 500 MG PO TABS: 500.0000 mg | ORAL_TABLET | Freq: Two times a day (BID) | ORAL | Status: DC

## 2013-11-16 NOTE — Progress Notes (Signed)
Patient ID: Carla Hodges, female   DOB: [birth year], 33 y.o.   MRN: [id number] Subjective:   CC: Vaginal discharge  HPI:   Patient presents to sameday clinic complaining of foul odor "down there."  Having vaginal discharge for 4 days. Medications tried: leftover flagyl x 3 days  Discharge consistency: thin Discharge color: gray Recent antibiotic use: flagyl per above Sex in last month: yes, unprotected with a man that she does not currently trust to be faithful to her Possible STD exposure:yes; also has had h/o trichomonas  Symptoms Fever: no Dysuria:no Vaginal bleeding: no Abdomen or Pelvic pain: occasional because of fibroids, no new pain though. Back pain: no Genital sores or ulcers:no Rash: no Pain during sex: no Missed menstrual period: no  Review of Systems - Per HPI.   PMH - pelvic pain, h/o obesity, GERD, fibroid, depression, BV 09/30/13. Medications reviewed. Of note, flagyl 09/30/13. Unprotected sex with partner whom she does not trust    Objective:  Physical Exam BP 93/68 mmHg  Pulse 74  Temp(Src) 98.2 F (36.8 C) (Oral)  Wt 245 lb (111.131 kg)  LMP 11/09/2013 (Approximate) GEN: NAD ABD: S/NT/ND GU: Vagina normal in appearance with normal tissue Moderate amount of thick white discharge No CMT; uterus and adnexae within normal limits for size and mobility; cervix normal appearance    Assessment:     Carla Hodges is a 33 y.o. female here for vaginal discharge.    Plan:     # See problem list and after visit summary for problem-specific plans.  # Health Maintenance: STD testing today.  Follow-up: Follow up in 1 week for if symptoms not improved.   Hilton Sinclair, MD [place]

## 2013-11-16 NOTE — Patient Instructions (Addendum)
  Vaginal Discharge We are getting labwork done today and will call you if anything is NOT normal. Be sure to protect yourself with condoms. Treatment - you should: take full course of flagyl. You should be better in: 3-5 days Call us if you have severe abdominal pain or sores or if you are not better in 1 week

## 2013-11-16 NOTE — Assessment & Plan Note (Signed)
Afebrile with normal vital signs. Recent GC/Chlamydia 9/24 negative. Wet prep at that time with many clue cells and no trichomonas. At this time, would like complete STD testing. Declines upreg. - Wet prep>>BV; will rx full course of flagyl. Discussed no alcohol.  - GC/Chlamydia - HIV/RPR - Discussed protecting herself with any partner. Pt voiced understanding.

## 2013-11-17 LAB — CERVICOVAGINAL ANCILLARY ONLY
Chlamydia: NEGATIVE
Neisseria Gonorrhea: NEGATIVE

## 2013-11-17 LAB — RPR

## 2013-11-17 LAB — HIV ANTIBODY (ROUTINE TESTING W REFLEX): HIV 1&2 Ab, 4th Generation: NONREACTIVE

## 2013-11-23 ENCOUNTER — Ambulatory Visit (INDEPENDENT_AMBULATORY_CARE_PROVIDER_SITE_OTHER): Payer: PRIVATE HEALTH INSURANCE | Admitting: Family Medicine

## 2013-11-23 ENCOUNTER — Telehealth: Payer: Self-pay | Admitting: Obstetrics and Gynecology

## 2013-11-23 ENCOUNTER — Encounter: Payer: Self-pay | Admitting: Family Medicine

## 2013-11-23 VITALS — BP 107/76 | HR 70 | Temp 98.2°F | Wt 246.0 lb

## 2013-11-23 DIAGNOSIS — N760 Acute vaginitis: Secondary | ICD-10-CM

## 2013-11-23 DIAGNOSIS — B9689 Other specified bacterial agents as the cause of diseases classified elsewhere: Secondary | ICD-10-CM

## 2013-11-23 DIAGNOSIS — N898 Other specified noninflammatory disorders of vagina: Secondary | ICD-10-CM

## 2013-11-23 DIAGNOSIS — A499 Bacterial infection, unspecified: Secondary | ICD-10-CM

## 2013-11-23 LAB — POCT WET PREP (WET MOUNT): CLUE CELLS WET PREP WHIFF POC: POSITIVE

## 2013-11-23 LAB — POCT URINE PREGNANCY: Preg Test, Ur: NEGATIVE

## 2013-11-23 NOTE — Patient Instructions (Signed)
Thank you for coming in,   We will call you with the results from today's labs.      Please feel free to call with any questions or concerns at any time, at [phone]. --Dr. Raeford Razor

## 2013-11-23 NOTE — Telephone Encounter (Signed)
xxx

## 2013-11-23 NOTE — Progress Notes (Signed)
    Subjective   Carla Hodges is a 33 y.o. female that presents for a same day visit  VAGINAL DISCHARGE/itching  Onset: Seen on 11/10. Treated for BV. Current symptoms started on Friday. Didn't complete prior treatment   Description: white, creamy Odor: yes  Itching: yes  Symptoms Dysuria: no  Bleeding: no  Pelvic pain: no  Back pain: no  Fever: no  Genital sores: no  Rash: no  Dyspareunia: no  GI Sxs: no  Prior treatment: yes, flagyl has one day left. Intermittent use of ABX. Took 6 days worth.    Red Flags: Missed period: no  Pregnancy: no  Recent antibiotics: yes  Sexual activity: no, nothing in the last 3-4 weeks   Possible STD exposure: no  IUD: no  Diabetes: no     History  Substance Use Topics  . Smoking status: Former Smoker    Quit date: 04/12/2011  . Smokeless tobacco: Not on file  . Alcohol Use: Yes     Comment: cut back and now drinks on weekends    ROS Per HPI  Objective   BP 107/76 mmHg  Pulse 70  Temp(Src) 98.2 F (36.8 C) (Oral)  Wt 246 lb (111.585 kg)  LMP 11/09/2013 (Approximate)  General: NAD, alert, cooperative with exam  GU: > External: no lesions, no LAD  > Vagina: no blood in vault > Cervix: no lesion; no mucopurulent d/c;    Assessment and Plan   Please refer to problem based charting of assessment and plan

## 2013-11-24 ENCOUNTER — Telehealth: Payer: Self-pay | Admitting: *Deleted

## 2013-11-24 ENCOUNTER — Telehealth: Payer: Self-pay | Admitting: Obstetrics and Gynecology

## 2013-11-24 DIAGNOSIS — N760 Acute vaginitis: Principal | ICD-10-CM

## 2013-11-24 DIAGNOSIS — B9689 Other specified bacterial agents as the cause of diseases classified elsewhere: Secondary | ICD-10-CM

## 2013-11-24 MED ORDER — METRONIDAZOLE 500 MG PO TABS: 500.0000 mg | ORAL_TABLET | Freq: Two times a day (BID) | ORAL | Status: DC

## 2013-11-24 NOTE — Telephone Encounter (Signed)
Patient states that the flagyl does not work for her and is wanting to see if something else can get called in

## 2013-11-24 NOTE — Telephone Encounter (Signed)
Will forward to MD who saw patient yesterday for this purpose. Glinda Natzke,CMA

## 2013-11-24 NOTE — Assessment & Plan Note (Signed)
Wet prep consistent with BV. Non compliant with prior treatment.  - will rx another 7 day course of flagyl  - if still refractory to treatment and compliant; consider using clindamycin 300 mg BID for 7 days.

## 2013-11-24 NOTE — Telephone Encounter (Signed)
-----   Message from Rosemarie Ax, MD sent at 11/24/2013  3:20 PM EST ----- Please call patient and inform she was positive for BV. Will send in antibiotics. Thanks.

## 2013-11-24 NOTE — Telephone Encounter (Signed)
Requests medicine since she is still itching down there.

## 2013-11-25 ENCOUNTER — Telehealth: Payer: Self-pay | Admitting: Obstetrics and Gynecology

## 2013-11-25 ENCOUNTER — Other Ambulatory Visit: Payer: Self-pay | Admitting: Family Medicine

## 2013-11-25 DIAGNOSIS — B3731 Acute candidiasis of vulva and vagina: Secondary | ICD-10-CM

## 2013-11-25 DIAGNOSIS — B373 Candidiasis of vulva and vagina: Secondary | ICD-10-CM

## 2013-11-25 MED ORDER — FLUCONAZOLE 150 MG PO TABS: ORAL_TABLET | ORAL | Status: DC

## 2013-11-25 NOTE — Telephone Encounter (Signed)
Pt called because she is taking to many antibiotics and this is why she has a yeast infection. She would like the doctor to call in something for that.jw

## 2013-11-25 NOTE — Telephone Encounter (Signed)
Will forward to Dr. Raeford Razor who saw patient. Promyse Ardito,CMA

## 2013-11-25 NOTE — Telephone Encounter (Signed)
Pt stated she is still having symptoms of yeast infection.  Pt also wanted to know if she should continue with second round of antibiotics given on 11/23/2013 for BV.  Per Dr. Parks Ranger; pt should continue antibiotics and will send it Diflucan with 1 refill.  Pt to call clinic for an appt if she is still having symptoms of yeast. Derl Barrow, RN

## 2013-11-25 NOTE — Telephone Encounter (Signed)
Patient has been informed, she is still upset, would like to talk to Doctor

## 2013-12-07 ENCOUNTER — Ambulatory Visit (INDEPENDENT_AMBULATORY_CARE_PROVIDER_SITE_OTHER): Payer: PRIVATE HEALTH INSURANCE | Admitting: Family Medicine

## 2013-12-07 VITALS — BP 108/74 | HR 63 | Temp 98.0°F | Ht 65.5 in | Wt 248.0 lb

## 2013-12-07 DIAGNOSIS — B9689 Other specified bacterial agents as the cause of diseases classified elsewhere: Secondary | ICD-10-CM

## 2013-12-07 DIAGNOSIS — N760 Acute vaginitis: Secondary | ICD-10-CM

## 2013-12-07 DIAGNOSIS — A499 Bacterial infection, unspecified: Secondary | ICD-10-CM

## 2013-12-07 DIAGNOSIS — B373 Candidiasis of vulva and vagina: Secondary | ICD-10-CM

## 2013-12-07 DIAGNOSIS — B3731 Acute candidiasis of vulva and vagina: Secondary | ICD-10-CM

## 2013-12-07 LAB — POCT WET PREP (WET MOUNT): CLUE CELLS WET PREP WHIFF POC: POSITIVE

## 2013-12-07 MED ORDER — CLINDAMYCIN HCL 300 MG PO CAPS: 300.0000 mg | ORAL_CAPSULE | Freq: Two times a day (BID) | ORAL | Status: DC

## 2013-12-07 MED ORDER — FLUCONAZOLE 150 MG PO TABS: ORAL_TABLET | ORAL | Status: DC

## 2013-12-07 NOTE — Patient Instructions (Signed)
Ms Payson it was great to meet you today!  I am sorry that you are not feeling well  Complete course of clinda twice a day for 7 days Call if still bothersome  Please return to clinic if symptoms do not improve or worsen Feel better soon Bernadene Bell, MD

## 2013-12-07 NOTE — Progress Notes (Signed)
Patient ID: Carla Hodges, female   DOB: 1980/06/26, 33 y.o.   MRN: [id number]   [facility] Bernadene Bell, MD Phone: [phone]  Subjective:  Carla Hodges is a 33 y.o F who presents for continues vaginal problems. Had positive clue cells on last 3 checks (two positive in the last 1 month). Persistent itching odor and yeasty discharge. Has be abstinent from sex for the last month. Has not changed partners. Denies bleeding. Has had nml cycles  All relevant systems were reviewed and were negative unless otherwise noted in the HPI  Past Medical History Reviewed problem list.  Medications- reviewed and updated Current Outpatient Prescriptions  Medication Sig Dispense Refill  . cephALEXin (KEFLEX) 500 MG capsule Take 1 capsule (500 mg total) by mouth 3 (three) times daily. 21 capsule 0  . escitalopram (LEXAPRO) 20 MG tablet Take 20 mg by mouth daily.    . fluconazole (DIFLUCAN) 150 MG tablet Take 1 tablet on Day 1, take 2nd tablet on Day 3 if symptoms persist. 2 tablet 1  . HYDROcodone-acetaminophen (NORCO) 10-325 MG per tablet Take 1 tablet by mouth every 8 (eight) hours as needed. 10 tablet 0  . metroNIDAZOLE (FLAGYL) 500 MG tablet Take 1 tablet (500 mg total) by mouth 2 (two) times daily. 14 tablet 0  . oxyCODONE-acetaminophen (PERCOCET/ROXICET) 5-325 MG per tablet Take 1 tablet by mouth every 4 (four) hours as needed. 20 tablet 0   No current facility-administered medications for this visit.   Chief complaint-noted No additions to family history Social history- patient is a former smoker  Objective: LMP 11/09/2013 (Approximate) Gen: NAD, alert, cooperative with exam GU: copious white vaginal discharge in the vault  Skin: no rashes no lesions  Assessment/Plan: See problem based a/p

## 2013-12-07 NOTE — Assessment & Plan Note (Signed)
Persistent recurrent Apparently noncompliant with tx in the past Finished at least 1.5 courses of flagyl Will switch to clinda 300 BID X7 days Could consider suppressive tx if needed

## 2014-02-07 ENCOUNTER — Other Ambulatory Visit (HOSPITAL_COMMUNITY)
Admission: RE | Admit: 2014-02-07 | Discharge: 2014-02-07 | Disposition: A | Discharge status: Home / Self Care | Payer: PRIVATE HEALTH INSURANCE | Source: Ambulatory Visit | Attending: Family Medicine | Admitting: Family Medicine

## 2014-02-07 ENCOUNTER — Ambulatory Visit: Payer: PRIVATE HEALTH INSURANCE | Admitting: Family Medicine

## 2014-02-07 ENCOUNTER — Telehealth: Payer: Self-pay | Admitting: Family Medicine

## 2014-02-07 ENCOUNTER — Encounter: Payer: Self-pay | Admitting: Family Medicine

## 2014-02-07 ENCOUNTER — Ambulatory Visit (INDEPENDENT_AMBULATORY_CARE_PROVIDER_SITE_OTHER): Payer: PRIVATE HEALTH INSURANCE | Admitting: Family Medicine

## 2014-02-07 VITALS — BP 109/79 | HR 65 | Temp 98.5°F | Wt 257.2 lb

## 2014-02-07 DIAGNOSIS — N898 Other specified noninflammatory disorders of vagina: Secondary | ICD-10-CM

## 2014-02-07 DIAGNOSIS — Z113 Encounter for screening for infections with a predominantly sexual mode of transmission: Secondary | ICD-10-CM | POA: Diagnosis present

## 2014-02-07 DIAGNOSIS — B9689 Other specified bacterial agents as the cause of diseases classified elsewhere: Secondary | ICD-10-CM

## 2014-02-07 DIAGNOSIS — N760 Acute vaginitis: Secondary | ICD-10-CM

## 2014-02-07 DIAGNOSIS — A499 Bacterial infection, unspecified: Secondary | ICD-10-CM

## 2014-02-07 LAB — POCT WET PREP (WET MOUNT): Clue Cells Wet Prep Whiff POC: POSITIVE

## 2014-02-07 MED ORDER — CLINDAMYCIN HCL 300 MG PO CAPS: 300.0000 mg | ORAL_CAPSULE | Freq: Two times a day (BID) | ORAL | Status: DC

## 2014-02-07 NOTE — Progress Notes (Signed)
Patient ID: Herminio Heads, female   DOB: 1980-11-02, 34 y.o.   MRN: [id number]  Tommi Rumps, MD Phone: [phone]  Carla Hodges is a 34 y.o. female who presents today for same day appointment.   Vaginal odor: patient notes odor since Thursday. White milky discharge. No itching or burning. No abdominal pain or fevers. She is sexually active with one partner. No condoms. She has a history of trichomonas. She notes recurrent history of BV.  Patient is a former smoker.   ROS: Per HPI   Physical Exam Filed Vitals:   02/07/14 1150  BP: 109/79  Pulse: 65  Temp: 98.5 F (36.9 C)    Gen: Well NAD GU: normal labia, normal vaginal mucosa, normal cervix, there is milky white d/c, no bleeding, normal bimanual exam Abd: s, NT, ND   Assessment/Plan: Please see individual problem list.  Tommi Rumps, MD [place] PGY-3

## 2014-02-07 NOTE — Telephone Encounter (Signed)
Called patient and informed of BV on wet prep. Will send in clindamycin to treat this as this has worked in the past for the patient. She voiced understanding.

## 2014-02-07 NOTE — Patient Instructions (Signed)
Nice to meet you.  We will call you with the results of your labs.  If you develop fever, abdominal pain, worsening discharge please seek medical attention.

## 2014-02-07 NOTE — Assessment & Plan Note (Signed)
Recurrent. Will treat with clindamycin 300 mg BID for 7 days. Await GC/chlamydia. Given return precautions.

## 2014-02-08 ENCOUNTER — Encounter: Payer: Self-pay | Admitting: Family Medicine

## 2014-02-08 LAB — GC/CHLAMYDIA PROBE AMP (~~LOC~~) NOT AT ARMC
Chlamydia: NEGATIVE
Neisseria Gonorrhea: NEGATIVE

## 2014-02-11 ENCOUNTER — Encounter: Payer: PRIVATE HEALTH INSURANCE | Admitting: Obstetrics and Gynecology

## 2014-02-21 ENCOUNTER — Encounter: Payer: PRIVATE HEALTH INSURANCE | Admitting: Obstetrics and Gynecology

## 2014-05-05 ENCOUNTER — Ambulatory Visit: Payer: Self-pay | Admitting: Women's Health

## 2014-09-19 ENCOUNTER — Other Ambulatory Visit (HOSPITAL_COMMUNITY)
Admission: RE | Admit: 2014-09-19 | Discharge: 2014-09-19 | Disposition: A | Discharge status: Home / Self Care | Payer: PRIVATE HEALTH INSURANCE | Source: Ambulatory Visit | Attending: Family Medicine | Admitting: Family Medicine

## 2014-09-19 ENCOUNTER — Ambulatory Visit (INDEPENDENT_AMBULATORY_CARE_PROVIDER_SITE_OTHER): Payer: PRIVATE HEALTH INSURANCE | Admitting: Internal Medicine

## 2014-09-19 VITALS — BP 115/79 | HR 72 | Temp 98.7°F | Ht 65.0 in | Wt 266.0 lb

## 2014-09-19 DIAGNOSIS — D251 Intramural leiomyoma of uterus: Secondary | ICD-10-CM | POA: Diagnosis not present

## 2014-09-19 DIAGNOSIS — N898 Other specified noninflammatory disorders of vagina: Secondary | ICD-10-CM | POA: Diagnosis not present

## 2014-09-19 DIAGNOSIS — R102 Pelvic and perineal pain: Secondary | ICD-10-CM | POA: Diagnosis not present

## 2014-09-19 DIAGNOSIS — Z113 Encounter for screening for infections with a predominantly sexual mode of transmission: Secondary | ICD-10-CM | POA: Insufficient documentation

## 2014-09-19 DIAGNOSIS — R360 Urethral discharge without blood: Secondary | ICD-10-CM

## 2014-09-19 DIAGNOSIS — N920 Excessive and frequent menstruation with regular cycle: Secondary | ICD-10-CM | POA: Insufficient documentation

## 2014-09-19 DIAGNOSIS — Z0000 Encounter for general adult medical examination without abnormal findings: Secondary | ICD-10-CM

## 2014-09-19 DIAGNOSIS — R3989 Other symptoms and signs involving the genitourinary system: Secondary | ICD-10-CM | POA: Insufficient documentation

## 2014-09-19 LAB — POCT WET PREP (WET MOUNT): CLUE CELLS WET PREP WHIFF POC: NEGATIVE

## 2014-09-19 MED ORDER — MELOXICAM 7.5 MG PO TABS: 7.5000 mg | ORAL_TABLET | Freq: Every day | ORAL | Status: DC

## 2014-09-19 NOTE — Progress Notes (Signed)
Subjective: Carla Hodges is a 34 y.o. female patient presenting for re-establishment of care.  Chronic medical conditions include: intramural uterine fibroid (seen on [place] 06/2013), recurrent BV, and depression.  CC: Lower abdominal pain -For the past 2 months, pain has been constantly at an 8-10 out of 10. It is sharp and located on the left.  -Naproxen doesn't help. Only hydrocodone and percocet have helped, which she has obtained from friends. -Eating sugar and bread makes it worse. Also exacerbated when her bladder gets full (especially during sleep). -Periods remain regular but bleeding 5 days each cycle with spotting in between for the past 4-5 months, whereas previously was bleeding 3 days per cycle without spotting. Passed a large blood clot about the size of her palm during last period. Goes through a bag of pads per cycle and infrequently has bled through pads.  -Would like to become pregnant and not interested in OCPs or IUD at this time. Plans to adopt if not pregnant by age 47.   In addition, patient has concern about a possible vaginal yeast infection: -Denies dysuria, itching or odor -Endorses increased thickness of discharge -Does not use condoms or hormonal birth control.  -Sexually active with one female partner who has not been monogamous, per patient -Has used clindamycin refills over the last 3 months due to concern for BV but not recently.   - ROS: Denies constipation but has occasional hard stools. - Nonsmoker  Objective: BP 115/79 mmHg  Pulse 72  Temp(Src) 98.7 F (37.1 C) (Oral)  Ht 5\' 5"  (1.651 m)  Wt 266 lb (120.657 kg)  BMI 44.26 kg/m2  LMP 09/02/2014 (Exact Date) Gen: Well-appearing, obese 34 y.o. female in no distress Cardiac: RRR, S1, S2, no murmurs, rubs or gallops Abdomen: TTP at LUQ and LLQ, no rebound tenderness GU: Minimal amount of thick white discharge on speculum exam. No odor. No cervical motion tenderness on bimanual exam.    Assessment/Plan: Carla Hodges is a 34 y.o. female here for abdominal pain that has worsened over the last 2 months, as well as concern for yeast infection. Suspect abdominal pain is secondary to known fibroid. Will continue to recommend NSAIDs for pain control and will refer to OB/GYN for possible surgical intervention.   Pelvic pain in female -Exam negative for cervical motion tenderness that would be concerning for PID. -Prescribed meloxicam 7.5 mg, with instructions to patient not to take more than 15 mg daily. -Ordered outpatient referral to ob/gyn to discuss surgical options to manage fibroid pain.  Abnormal urogenital discharge -Pelvic exam normal. -Wet prep negative for yeast or clue cells.  -Gonorrhea/chlamydia and HIV testing were performed. Will contact patient if results abnormal.   Healthcare maintenance -Not interested in receiving flu vaccine at this time.   Heavy menses -Patient not interested in OCPs or IUD at this time.

## 2014-09-19 NOTE — Assessment & Plan Note (Signed)
-  Patient not interested in OCPs or IUD at this time.

## 2014-09-19 NOTE — Assessment & Plan Note (Addendum)
-  Exam negative for cervical motion tenderness that would be concerning for PID. -Prescribed meloxicam 7.5 mg, with instructions to patient not to take more than 15 mg daily. -Ordered outpatient referral to ob/gyn to discuss surgical options to manage likely fibroid pain.

## 2014-09-19 NOTE — Assessment & Plan Note (Signed)
-  Wet prep negative for yeast or clue cells.  -Gonorrhea/chlamydia and HIV testing were performed. Will contact patient if results abnormal.

## 2014-09-19 NOTE — Patient Instructions (Addendum)
It was nice to meet you today, Carla Hodges.  We discussed your pain, which seems to be due to your uterine fibroid. I have prescribed meloxicam 7.5 mg to be taken once daily. The maximum dose is 15 mg daily (2 tabs). I have also made a referral to an ob/gyn doctor to discuss possible surgical options to treat your fibroid. You should be called with an appointment.   Please return in a few weeks to discuss how the meloxicam has done for your pain.  I will call you if any of your lab results are abnormal (tested HIV, gonorrhea, chlamydia). Wet prep was negative for yeast or BV.

## 2014-09-19 NOTE — Assessment & Plan Note (Signed)
-  Not interested in receiving flu vaccine at this time.

## 2014-09-20 ENCOUNTER — Encounter: Payer: Self-pay | Admitting: Internal Medicine

## 2014-09-20 LAB — CERVICOVAGINAL ANCILLARY ONLY
CHLAMYDIA, DNA PROBE: NEGATIVE
Neisseria Gonorrhea: NEGATIVE

## 2014-09-20 LAB — HIV ANTIBODY (ROUTINE TESTING W REFLEX): HIV 1&2 Ab, 4th Generation: NONREACTIVE

## 2014-10-10 ENCOUNTER — Encounter: Payer: Self-pay | Admitting: Obstetrics and Gynecology

## 2014-10-31 ENCOUNTER — Encounter: Payer: PRIVATE HEALTH INSURANCE | Admitting: Obstetrics & Gynecology

## 2014-11-04 ENCOUNTER — Other Ambulatory Visit (HOSPITAL_COMMUNITY)
Admission: RE | Admit: 2014-11-04 | Discharge: 2014-11-04 | Disposition: A | Discharge status: Home / Self Care | Payer: PRIVATE HEALTH INSURANCE | Source: Ambulatory Visit | Attending: Family Medicine | Admitting: Family Medicine

## 2014-11-04 ENCOUNTER — Ambulatory Visit (INDEPENDENT_AMBULATORY_CARE_PROVIDER_SITE_OTHER): Payer: PRIVATE HEALTH INSURANCE | Admitting: Family Medicine

## 2014-11-04 VITALS — BP 122/62 | HR 75 | Temp 98.3°F | Wt 281.0 lb

## 2014-11-04 DIAGNOSIS — N760 Acute vaginitis: Secondary | ICD-10-CM | POA: Diagnosis not present

## 2014-11-04 DIAGNOSIS — Z113 Encounter for screening for infections with a predominantly sexual mode of transmission: Secondary | ICD-10-CM | POA: Diagnosis present

## 2014-11-04 DIAGNOSIS — Z01419 Encounter for gynecological examination (general) (routine) without abnormal findings: Secondary | ICD-10-CM | POA: Insufficient documentation

## 2014-11-04 DIAGNOSIS — Z202 Contact with and (suspected) exposure to infections with a predominantly sexual mode of transmission: Secondary | ICD-10-CM

## 2014-11-04 DIAGNOSIS — A499 Bacterial infection, unspecified: Secondary | ICD-10-CM

## 2014-11-04 DIAGNOSIS — Z1151 Encounter for screening for human papillomavirus (HPV): Secondary | ICD-10-CM | POA: Insufficient documentation

## 2014-11-04 DIAGNOSIS — Z124 Encounter for screening for malignant neoplasm of cervix: Secondary | ICD-10-CM

## 2014-11-04 DIAGNOSIS — B9689 Other specified bacterial agents as the cause of diseases classified elsewhere: Secondary | ICD-10-CM

## 2014-11-04 LAB — POCT WET PREP (WET MOUNT): CLUE CELLS WET PREP WHIFF POC: POSITIVE

## 2014-11-04 LAB — HIV ANTIBODY (ROUTINE TESTING W REFLEX): HIV 1&2 Ab, 4th Generation: NONREACTIVE

## 2014-11-04 MED ORDER — CLINDAMYCIN HCL 300 MG PO CAPS: 300.0000 mg | ORAL_CAPSULE | Freq: Two times a day (BID) | ORAL | Status: DC

## 2014-11-04 NOTE — Patient Instructions (Signed)
Thank you for coming to see me today. It was a pleasure. Today we talked about:   Bacterial vaginosis: I am treating you with clindamycin 300mg  twice a day for 7 days  You should expect the results of: pap smear, gonorrhea/chlamydia  If you have any questions or concerns, please do not hesitate to call the office at [phone].  Sincerely,  Cordelia Poche, MD

## 2014-11-04 NOTE — Progress Notes (Signed)
    Subjective   Carla Hodges is a 34 y.o. female that presents for a same day visit  1. Vaginal odor: Patient reports history bad vaginal odor for two weeks. She reports no discharge or itching. LMP 10/28/2014. She has a history of BV and Trichomonas. No Chlamydia or Gonorrhea. No fevers, nausea or vomiting.  ROS Per HPI  Social History  Substance Use Topics  . Smoking status: Former Smoker    Quit date: 04/12/2011  . Smokeless tobacco: Not on file  . Alcohol Use: Yes     Comment: cut back and now drinks on weekends    Allergies  Allergen Reactions  . Latex Other (See Comments)    "break out down there" with latex condom use    Objective   BP 122/62 mmHg  Pulse 75  Temp(Src) 98.3 F (36.8 C) (Oral)  Wt 281 lb (127.461 kg)  SpO2 98%  LMP 10/28/2014 (Exact Date)  General: well appearing, no distress Genitourinary: Labia normal. Slight pain on entering introitus with speculum. Vagina looks normal with some milky discharge. Could not assess odor. Cervix seen in entirety and appears normal with no discharge from os.  Assessment and Plan   Meds ordered this encounter  Medications  . clindamycin (CLEOCIN) 300 MG capsule    Sig: Take 1 capsule (300 mg total) by mouth 2 (two) times daily.    Dispense:  14 capsule    Refill:  0    Bacterial vaginosis: patient requesting alternative treatment to metronidazole  GC/Chlamydia  Clindamycin 300mg  twice a day #14  Healthcare maintenance - pap smear

## 2014-11-05 LAB — RPR

## 2014-11-08 LAB — CYTOLOGY - PAP

## 2014-11-15 ENCOUNTER — Encounter: Payer: Self-pay | Admitting: Family Medicine

## 2014-12-14 ENCOUNTER — Ambulatory Visit (INDEPENDENT_AMBULATORY_CARE_PROVIDER_SITE_OTHER): Payer: PRIVATE HEALTH INSURANCE | Admitting: Family Medicine

## 2014-12-14 ENCOUNTER — Encounter: Payer: Self-pay | Admitting: Family Medicine

## 2014-12-14 ENCOUNTER — Other Ambulatory Visit (HOSPITAL_COMMUNITY)
Admission: RE | Admit: 2014-12-14 | Discharge: 2014-12-14 | Disposition: A | Discharge status: Home / Self Care | Payer: PRIVATE HEALTH INSURANCE | Source: Ambulatory Visit | Attending: Family Medicine | Admitting: Family Medicine

## 2014-12-14 VITALS — BP 114/72 | HR 77 | Temp 98.2°F | Ht 65.0 in | Wt 291.0 lb

## 2014-12-14 DIAGNOSIS — N898 Other specified noninflammatory disorders of vagina: Secondary | ICD-10-CM

## 2014-12-14 DIAGNOSIS — B373 Candidiasis of vulva and vagina: Secondary | ICD-10-CM

## 2014-12-14 DIAGNOSIS — Z113 Encounter for screening for infections with a predominantly sexual mode of transmission: Secondary | ICD-10-CM | POA: Insufficient documentation

## 2014-12-14 DIAGNOSIS — B3731 Acute candidiasis of vulva and vagina: Secondary | ICD-10-CM

## 2014-12-14 LAB — POCT WET PREP (WET MOUNT): Clue Cells Wet Prep Whiff POC: NEGATIVE

## 2014-12-14 MED ORDER — FLUCONAZOLE 150 MG PO TABS: ORAL_TABLET | ORAL | Status: DC

## 2014-12-14 NOTE — Patient Instructions (Signed)
Thank you for coming in to clinic today.  1. Your symptoms sound consistent with a Vaginitis, diagnosed with Yeast on wet prep 2. Tested wet prep today - negative for Trichomonas or BV - Start Diflucan 150mg  pill, take 1 and then on Day 3 take 2nd pill. 3. Will call with results for Gonorrhea / Chlamydia swab in a few days  If tests negative, maybe normal symptoms following period. Unprotected intercourse can cause irritation and no infection sometimes. Make sure to try using condoms (latex free) to see if reduces your symptoms. Probiotics and yogurt can help reduce BV as well.  Please schedule a follow-up appointment with Dr Ola Spurr in about 1 month for follow-up STD check, consider blood tests with HIV, RPR, for screening  If you have any other questions or concerns, please feel free to call the clinic to contact me. You may also schedule an earlier appointment if necessary.  However, if your symptoms get significantly worse, please go to the Emergency Department to seek immediate medical attention.  Nobie Putnam, [place]

## 2014-12-14 NOTE — Progress Notes (Signed)
Subjective:    Patient ID: Carla Hodges, female    DOB: 1980/09/20, 34 y.o.   MRN: [id number]  Carla Hodges is a 34 y.o. female presenting on 12/14/2014 for Vaginal Discharge  Patient presents for a same day appointment.  HPI  VAGINAL DISCHARGE / STD CHECK: - Currently reports increased "white creamy" vaginal discharge for past few days. Denies any vaginal odor with her discharge. She thinks most likely yeast infection.  - Known history of fibroid with some chronic pelvic pain. Currently without worsening. - Admits prior STD history with Trichomonas several years ago - Sexual history currently with one female partner, unprotected vaginal intercourse, no condoms. She does admit to recently within past 1 week catching her partner with reported potential other sexual contact.  - Denies any fevers/chills, rash or vesicles  Past Medical History  Diagnosis Date  . Bronchitis, chronic ([place])   . Fibroids     Social History   Social History  . Marital Status: Single    Spouse Name: N/A  . Number of Children: N/A  . Years of Education: N/A   Occupational History  . Not on file.   Social History Main Topics  . Smoking status: Former Smoker    Quit date: 04/12/2011  . Smokeless tobacco: Not on file  . Alcohol Use: Yes     Comment: cut back and now drinks on weekends  . Drug Use: No  . Sexual Activity: Yes    Birth Control/ Protection: None   Other Topics Concern  . Not on file   Social History Narrative   Lives with grandmother in [place]. 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     Current Outpatient Prescriptions on File Prior to Visit  Medication Sig  . escitalopram (LEXAPRO) 20 MG tablet Take 20 mg by mouth daily.  . meloxicam (MOBIC) 7.5 MG tablet Take 1 tablet (7.5 mg total) by mouth daily.   No current facility-administered medications on file prior to visit.    Review of Systems  Constitutional: Negative for fever, chills and  diaphoresis.  Gastrointestinal: Negative for nausea, abdominal pain, diarrhea and constipation.  Genitourinary: Positive for vaginal discharge. Negative for dysuria, frequency, flank pain and pelvic pain.  Skin: Negative for rash.   Per HPI unless specifically indicated above     Objective:    BP 114/72 mmHg  Pulse 77  Temp(Src) 98.2 F (36.8 C) (Oral)  Ht 5\' 5"  (1.651 m)  Wt 291 lb (131.997 kg)  BMI 48.43 kg/m2  LMP 11/24/2014  Wt Readings from Last 3 Encounters:  12/14/14 291 lb (131.997 kg)  11/04/14 281 lb (127.461 kg)  09/19/14 266 lb (120.657 kg)    Physical Exam  Constitutional: She appears well-developed and well-nourished. No distress.  Obese, comfortable  HENT:  Mouth/Throat: Oropharynx is clear and moist.  Eyes: Conjunctivae are normal.  Genitourinary: Vaginal discharge (white thick curdlike, consistent with yeast) found.  Normal external female genitalia. Vaginal canal without lesions. Limited view of cervix, without lesions or bleeding. Thick white curdlike discharge. Bimanual exam without masses or cervical motion tenderness. Chaperoned by Kallie Locks, CMA  Musculoskeletal: Normal range of motion. She exhibits no edema or tenderness.  Neurological: She is alert.  Skin: Skin is warm and dry. No rash noted. She is not diaphoretic.  No evidence genital rash, ulceration, blisters, or vesicles.  Nursing note and vitals reviewed.  Results for orders placed or performed in visit on 12/14/14  POCT Wet Prep [facility] [place])  Result Value Ref Range   Source Wet Prep POC VAG    WBC, Wet Prep HPF POC 1-5    Bacteria Wet Prep HPF POC Many (A) None, Few, Too numerous to count   Clue Cells Wet Prep HPF POC None None, Too numerous to count   Clue Cells Wet Prep Whiff POC Negative Whiff    Yeast Wet Prep HPF POC Moderate    Trichomonas Wet Prep HPF POC NONE       Assessment & Plan:   Problem List Items Addressed This Visit    Yeast vaginitis - Primary    Clinically  consistent vaginal discharge on pelvic exam. Confirmed on wet prep.  Plan: 1. Start Diflucan 150mg  PO x 1 tab, repeat dose on Day 3 if persistent symptoms 2. Counseled on preventing yeast, BV, encouraged condom use       Relevant Medications   fluconazole (DIFLUCAN) 150 MG tablet    Other Visit Diagnoses    Vaginal discharge        Relevant Orders    Cervicovaginal ancillary only    POCT Wet Prep [facility]) (Completed)    Screening for STD (sexually transmitted disease)        Check GC/Chlamydia, recent HIV/RPR negative. Recommend return 1 month for follow-up STD check.    Relevant Orders    Cervicovaginal ancillary only    POCT Wet Prep [facility]) (Completed)    Vaginal yeast infection        Relevant Medications    fluconazole (DIFLUCAN) 150 MG tablet       Meds ordered this encounter  Medications  . fluconazole (DIFLUCAN) 150 MG tablet    Sig: Take 1 tablet on Day 1, take 2nd tablet on Day 3 if symptoms persist.    Dispense:  2 tablet    Refill:  1      Follow up plan: Return in about 4 weeks (around 01/11/2015) for std check.  Nobie Putnam, [place], PGY-3

## 2014-12-14 NOTE — Assessment & Plan Note (Signed)
Clinically consistent vaginal discharge on pelvic exam. Confirmed on wet prep.  Plan: 1. Start Diflucan 150mg  PO x 1 tab, repeat dose on Day 3 if persistent symptoms 2. Counseled on preventing yeast, BV, encouraged condom use

## 2014-12-15 LAB — CERVICOVAGINAL ANCILLARY ONLY
CHLAMYDIA, DNA PROBE: NEGATIVE
Neisseria Gonorrhea: NEGATIVE

## 2015-02-23 ENCOUNTER — Other Ambulatory Visit: Payer: Self-pay | Admitting: Internal Medicine

## 2015-02-23 NOTE — Telephone Encounter (Signed)
Called patient at number listed ([phone]) to try to obtain more information about symptoms and why she thinks she has BV (thinner discharge and fishy smell versus curd-like discharge). She was most recently treated for a yeast infection but had been treated for BV prior to that episode. Will re-attempt to reach patient 02/24/15.

## 2015-02-23 NOTE — Telephone Encounter (Signed)
Needs refill on clindamycine.  Has BV again.  Hasnt had sex.  Is this the highest dosage available?  Doesn't feel like she needs to come in.  [facility] [address]

## 2015-02-24 ENCOUNTER — Other Ambulatory Visit (HOSPITAL_COMMUNITY)
Admission: RE | Admit: 2015-02-24 | Discharge: 2015-02-24 | Disposition: A | Discharge status: Home / Self Care | Payer: PRIVATE HEALTH INSURANCE | Source: Ambulatory Visit | Attending: Family Medicine | Admitting: Family Medicine

## 2015-02-24 ENCOUNTER — Ambulatory Visit (INDEPENDENT_AMBULATORY_CARE_PROVIDER_SITE_OTHER): Payer: PRIVATE HEALTH INSURANCE | Admitting: Family Medicine

## 2015-02-24 ENCOUNTER — Encounter: Payer: Self-pay | Admitting: Family Medicine

## 2015-02-24 VITALS — Temp 98.0°F | Wt 297.8 lb

## 2015-02-24 DIAGNOSIS — B373 Candidiasis of vulva and vagina: Secondary | ICD-10-CM

## 2015-02-24 DIAGNOSIS — B3731 Acute candidiasis of vulva and vagina: Secondary | ICD-10-CM

## 2015-02-24 DIAGNOSIS — N898 Other specified noninflammatory disorders of vagina: Secondary | ICD-10-CM | POA: Diagnosis not present

## 2015-02-24 DIAGNOSIS — Z113 Encounter for screening for infections with a predominantly sexual mode of transmission: Secondary | ICD-10-CM | POA: Insufficient documentation

## 2015-02-24 LAB — POCT WET PREP (WET MOUNT): Clue Cells Wet Prep Whiff POC: POSITIVE

## 2015-02-24 MED ORDER — CLINDAMYCIN HCL 300 MG PO CAPS: 300.0000 mg | ORAL_CAPSULE | Freq: Two times a day (BID) | ORAL | Status: DC

## 2015-02-24 MED ORDER — FLUCONAZOLE 150 MG PO TABS: ORAL_TABLET | ORAL | Status: DC

## 2015-02-24 MED ORDER — MELOXICAM 15 MG PO TABS: 7.5000 mg | ORAL_TABLET | Freq: Every day | ORAL | Status: DC

## 2015-02-24 MED ORDER — METRONIDAZOLE 0.75 % VA GEL: 1.0000 | Freq: Every day | VAGINAL | Status: DC

## 2015-02-24 NOTE — Progress Notes (Signed)
   Subjective:    Patient ID: Carla Hodges, female    DOB: 1980/06/07, 35 y.o.   MRN: [id number]  HPI  Patient presents for Same Day Appointment  CC: vaginal discahrge  # Vaginal discharge:  Has frequent BV and yeast infections  Started in past week after menstrual period  White/thin discharge  No abdominal pain  No dysuria  Has tried taking clindamycin she had leftover from last infection ROS: no nausea or vomiting   Social Hx: former smoker  Review of Systems   See HPI for ROS.   Past medical history, surgical, family, and social history reviewed and updated in the EMR as appropriate.  Objective:  Temp(Src) 98 F (36.7 C) (Oral)  Wt 297 lb 12.8 oz (135.081 kg)  LMP 02/13/2015 (Exact Date) Vitals and nursing note reviewed  General: no apparent distress  GU: exam done with chaperone present in room. Normal external genitalia, normal vaginal mucosa, normal appearing cervix. There is a moderate amount of white both thin and thick discharge.  Assessment & Plan:   1. Vaginal discharge BV on wet prep. Treat with clindamycin. Discussed prophylaxis (rx for metrogel sent in) however patient said it was very expensive so she is not sure she will do this. Also discussed probiotic therapy. Additionally given refill for meloxicam for cramping pains with menstrual cycle. - POCT Wet Prep [facility]) - Cervicovaginal ancillary only - clindamycin 300mg  BID x 7 days - fluconazole (DIFLUCAN) 150 MG tablet; Take 1 tablet on Day 1, take 2nd tablet on Day 3 if symptoms persist.  Dispense: 2 tablet; Refill: 1

## 2015-02-24 NOTE — Patient Instructions (Addendum)
You have BV.  While taking clindamycin take another probiotic or eat things with "good bacteria" like yogurt, greek yogurt.  Try the probiotic (gives you good bacteria) Fem Dophilus; take this once a day. It appears to be cheapest at [employer] ($19 for month supply)

## 2015-02-24 NOTE — Telephone Encounter (Signed)
appt today. Fleeger, Salome Spotted, CMA

## 2015-02-27 LAB — CERVICOVAGINAL ANCILLARY ONLY
Chlamydia: NEGATIVE
Neisseria Gonorrhea: NEGATIVE

## 2015-03-28 ENCOUNTER — Other Ambulatory Visit: Payer: Self-pay | Admitting: Family Medicine

## 2015-04-13 ENCOUNTER — Encounter: Payer: Self-pay | Admitting: Family Medicine

## 2015-04-13 ENCOUNTER — Ambulatory Visit (INDEPENDENT_AMBULATORY_CARE_PROVIDER_SITE_OTHER): Payer: PRIVATE HEALTH INSURANCE | Admitting: Family Medicine

## 2015-04-13 VITALS — BP 118/82 | HR 72 | Temp 98.1°F | Ht 65.0 in | Wt 305.1 lb

## 2015-04-13 DIAGNOSIS — N760 Acute vaginitis: Secondary | ICD-10-CM

## 2015-04-13 DIAGNOSIS — B9689 Other specified bacterial agents as the cause of diseases classified elsewhere: Secondary | ICD-10-CM

## 2015-04-13 DIAGNOSIS — B3731 Acute candidiasis of vulva and vagina: Secondary | ICD-10-CM

## 2015-04-13 DIAGNOSIS — L739 Follicular disorder, unspecified: Secondary | ICD-10-CM | POA: Diagnosis not present

## 2015-04-13 DIAGNOSIS — A499 Bacterial infection, unspecified: Secondary | ICD-10-CM

## 2015-04-13 DIAGNOSIS — B373 Candidiasis of vulva and vagina: Secondary | ICD-10-CM

## 2015-04-13 MED ORDER — CLINDAMYCIN HCL 300 MG PO CAPS: 300.0000 mg | ORAL_CAPSULE | Freq: Two times a day (BID) | ORAL | Status: DC

## 2015-04-13 MED ORDER — FLUCONAZOLE 150 MG PO TABS: ORAL_TABLET | ORAL | Status: DC

## 2015-04-13 NOTE — Assessment & Plan Note (Signed)
Right groin area of induration possible developing early folliculitis vs abscess without surrounding cellulitis or any systemic symptoms.  Plan: 1. Treating BV with Clindamycin, likely cover developing infection 2. Warm compresses / tub soaks 3. Return precautions given if worsening

## 2015-04-13 NOTE — Assessment & Plan Note (Signed)
Consistent per history with recurrent BV, multiple prior positive wet preps. Today deferred wet prep. - Last treatment 02/2015  Plan: 1. Repeat Clindamycin course x 7 days, 0 refills 2. Already has Metrogel rx at pharmacy, advised to take in future for repeat BV vs prophylaxis 3. Continue Probiotics, future may counsel to take entire pill, concern not absorbed if opening pill 4. Routine BV prevention counseling, no unprotected intercourse 5. Given precaution rx Diflucan for potential yeast infection after antibiotics 6. Return criteria given

## 2015-04-13 NOTE — Progress Notes (Signed)
Subjective:    Patient ID: Carla Hodges, female    DOB: 1980-08-21, 35 y.o.   MRN: [id number]  Carla Hodges is a 35 y.o. female presenting on 04/13/2015 for Vaginal Discharge   Patient presents for a same day appointment.   HPI  VAGINAL ODOR / H/o Recurrent BV: - Last [facility] visit 02/2015 with BV, had wet prep many clue cells positive whiff, treated with Clindamycin and given Metrogel prophylaxis (never filled due to cost) - Reports symptoms x 1 week with vaginal discharge and odor, similar to prior chronic history of recurrent BV. Triggers include scented pantiliner, previously related to sexual intercourse but currently denies any intercourse recently. Tried taking Probiotic capsules daily (opens them and consumes the powder, does not take the capsules itself due to containing pork product) - Not using any cleansing products or other products in contact with vagina - Denies any fevers/chills, abdominal pain, nausea, vomiting, rash ulcer, vaginal itching, dysuria  FOLLICULITIS: - Reports small "razor bump" after shaving pubic hair recently, she has had these before, thinks current spot is early. Not tried warm water soaks. No medicines - No chronic history of axillary or groin abscesses - Denies erythema, drainage of pus, swelling  Social History  Substance Use Topics  . Smoking status: Former Smoker    Quit date: 04/12/2011  . Smokeless tobacco: None  . Alcohol Use: Yes     Comment: cut back and now drinks on weekends    Review of Systems Per HPI unless specifically indicated above     Objective:    BP 118/82 mmHg  Pulse 72  Temp(Src) 98.1 F (36.7 C) (Oral)  Ht 5\' 5"  (1.651 m)  Wt 305 lb 1.6 oz (138.392 kg)  BMI 50.77 kg/m2  LMP 04/08/2015  Wt Readings from Last 3 Encounters:  04/13/15 305 lb 1.6 oz (138.392 kg)  02/24/15 297 lb 12.8 oz (135.081 kg)  12/14/14 291 lb (131.997 kg)    Physical Exam  Constitutional: She appears well-developed and  well-nourished. No distress.  Obese, well-appearing, comfortable  Genitourinary:  Normal appearing external genitalia. Deferred pelvic exam, no obvious odor and no vaginal discharge externally. Shaven without pubic hair, Right upper thigh / perineal skin fold with small < 1x1 cm nodular area of mild induration without erythema, no open ulceration or drainage, minimal tenderness to touch  Skin: Skin is warm and dry. She is not diaphoretic.  Nursing note and vitals reviewed.  Exam chaperoned by CMA.      Assessment & Plan:   Problem List Items Addressed This Visit    Folliculitis    Right groin area of induration possible developing early folliculitis vs abscess without surrounding cellulitis or any systemic symptoms.  Plan: 1. Treating BV with Clindamycin, likely cover developing infection 2. Warm compresses / tub soaks 3. Return precautions given if worsening       Relevant Medications   clindamycin (CLEOCIN) 300 MG capsule   BV (bacterial vaginosis) - Primary    Consistent per history with recurrent BV, multiple prior positive wet preps. Today deferred wet prep. - Last treatment 02/2015  Plan: 1. Repeat Clindamycin course x 7 days, 0 refills 2. Already has Metrogel rx at pharmacy, advised to take in future for repeat BV vs prophylaxis 3. Continue Probiotics, future may counsel to take entire pill, concern not absorbed if opening pill 4. Routine BV prevention counseling, no unprotected intercourse 5. Given precaution rx Diflucan for potential yeast infection after antibiotics 6. Return criteria given  Relevant Medications   fluconazole (DIFLUCAN) 150 MG tablet   clindamycin (CLEOCIN) 300 MG capsule    Other Visit Diagnoses    Vaginal yeast infection        Relevant Medications    fluconazole (DIFLUCAN) 150 MG tablet    clindamycin (CLEOCIN) 300 MG capsule       Meds ordered this encounter  Medications  . fluconazole (DIFLUCAN) 150 MG tablet    Sig: If develop  yeast infection. Take 1 tablet on Day 1, take 2nd tablet on Day 3 if symptoms persist.    Dispense:  2 tablet    Refill:  0  . clindamycin (CLEOCIN) 300 MG capsule    Sig: Take 1 capsule (300 mg total) by mouth 2 (two) times daily.    Dispense:  14 capsule    Refill:  0      Follow up plan: Return in about 2 weeks (around 04/27/2015), or if symptoms worsen or fail to improve, for recurrent BV, early folliculitis.  Nobie Putnam, [place], PGY-3

## 2015-04-13 NOTE — Patient Instructions (Signed)
Thank you for coming in to clinic today.  1. For Bacterial Vaginosis (BV) - Take Clindamycin antibiotic as prescribed - It will help your folliculitis or small early hair bump abscess - Try warm water bathtub soaks 1-2x daily for few days to help it heal, it may drain and that is normal - keep taking probiotic - If your symptoms of BV get worse or future recurrence, you may try the Metrogel vaginal gel as prescribed - If you develop yeast infection take Diflucan as prescribed   If your symptoms do not resolve with the treatment, we will need to re-evaluate you  Please schedule a follow-up appointment with Dr Ola Spurr in 2-4 weeks to follow-up Vaginal Discharge / BV as needed  If you have any other questions or concerns, please feel free to call the clinic to contact me. You may also schedule an earlier appointment if necessary.  However, if your symptoms get significantly worse, please go to the Emergency Department to seek immediate medical attention.  Nobie Putnam, [place]

## 2015-04-27 ENCOUNTER — Other Ambulatory Visit: Payer: Self-pay | Admitting: Internal Medicine

## 2015-04-28 ENCOUNTER — Ambulatory Visit (INDEPENDENT_AMBULATORY_CARE_PROVIDER_SITE_OTHER): Payer: PRIVATE HEALTH INSURANCE | Admitting: Family Medicine

## 2015-04-28 ENCOUNTER — Encounter: Payer: Self-pay | Admitting: Family Medicine

## 2015-04-28 VITALS — BP 118/74 | HR 76 | Temp 98.1°F | Ht 65.0 in | Wt 311.1 lb

## 2015-04-28 DIAGNOSIS — N764 Abscess of vulva: Secondary | ICD-10-CM

## 2015-04-28 DIAGNOSIS — L739 Follicular disorder, unspecified: Secondary | ICD-10-CM

## 2015-04-28 MED ORDER — SULFAMETHOXAZOLE-TRIMETHOPRIM 800-160 MG PO TABS: 1.0000 | ORAL_TABLET | Freq: Two times a day (BID) | ORAL | Status: DC

## 2015-04-28 NOTE — Patient Instructions (Signed)
Incision and Drainage, Care After Refer to this sheet in the next few weeks. These instructions provide you with information on caring for yourself after your procedure. Your caregiver may also give you more specific instructions. Your treatment has been planned according to current medical practices, but problems sometimes occur. Call your caregiver if you have any problems or questions after your procedure. HOME CARE INSTRUCTIONS   If antibiotic medicine is given, take it as directed. Finish it even if you start to feel better.  Only take over-the-counter or prescription medicines for pain, discomfort, or fever as directed by your caregiver.  Keep all follow-up appointments as directed by your caregiver.  Change any bandages (dressings) as directed by your caregiver. Replace old dressings with clean dressings.  Wash your hands before and after caring for your wound. You will receive specific instructions for cleansing and caring for your wound.  SEEK MEDICAL CARE IF:   You have increased pain, swelling, or redness around the wound.  You have increased drainage, smell, or bleeding from the wound.  You have muscle aches, chills, or you feel generally sick.  You have a fever. MAKE SURE YOU:   Understand these instructions.  Will watch your condition.  Will get help right away if you are not doing well or get worse.   This information is not intended to replace advice given to you by your health care provider. Make sure you discuss any questions you have with your health care provider.   Document Released: 03/18/2011 Document Revised: 01/14/2014 Document Reviewed: 03/18/2011 Elsevier Interactive Patient Education 2016 Elsevier Inc.  

## 2015-05-03 NOTE — Progress Notes (Signed)
   Subjective:   Ovie A Mccalip is a 35 y.o. female with a history of folliculitis here for ongoing pain and swelling  Pt seen for vulvar folliculitis last week, some improvement on clinda, small swollen and tender area remaining. No discharge. No lesions elsewhere. Has had this problem many times due to shaving pubic hair.  Review of Systems:  Per HPI. All other systems reviewed and are negative.   PMH, PSH, Medications, Allergies, and FmHx reviewed and updated in EMR.  Social History: former smoker  Objective:  BP 118/74 mmHg  Pulse 76  Temp(Src) 98.1 F (36.7 C) (Oral)  Ht 5\' 5"  (1.651 m)  Wt 141.114 kg  BMI 51.77 kg/m2  LMP 04/08/2015  Gen:  35 y.o. female in NAD HEENT: NCAT, MMM, EOMI, PERRL, anicteric sclerae CV: RRR, no MRG, no JVD Resp: Non-labored, CTAB, no wheezes noted Abd: Soft, NTND, BS present, no guarding or organomegaly Ext: WWP, no edema Pelvic: Right labial swelling, erythema and tenderness in small (~2cm) area Neuro: Alert and oriented, speech normal    Assessment & Plan:     Jules A Polizzi is a 35 y.o. female here for lump in groin not going away  Folliculitis Small vulvar abscess, not resolved with 1 week abx - I&D today - likely will not need further abx but will rx bactrim in case of worsening pain/swelling over the next few days, call if this occurs    INCISION AND DRAINAGE Performed by: Beverlyn Roux Consent: Verbal consent obtained. Risks and benefits: risks, benefits and alternatives were discussed Type: abscess  Body area: right labia  Anesthesia: local infiltration  Incision was made with a scalpel.  Local anesthetic: lidocaine 1% with epinephrine  Anesthetic total: 2 ml  Complexity: complex Blunt dissection to break up loculations  Drainage: purulent  Drainage amount: <71mL  Packing material:  gauze  Patient tolerance: Patient tolerated the procedure well with no immediate complications.    Beverlyn Roux, MD,  [facility] PGY-3 05/03/2015 8:32 AM

## 2015-05-03 NOTE — Assessment & Plan Note (Addendum)
Small vulvar abscess likely started as folliculitis, not resolved with 1 week abx - I&D today - likely will not need further abx but will rx bactrim in case of worsening pain/swelling over the next few days, call if this occurs

## 2015-05-21 ENCOUNTER — Other Ambulatory Visit: Payer: Self-pay | Admitting: Family Medicine

## 2015-05-24 ENCOUNTER — Telehealth: Payer: Self-pay | Admitting: Internal Medicine

## 2015-05-24 ENCOUNTER — Encounter: Payer: Self-pay | Admitting: Internal Medicine

## 2015-05-24 ENCOUNTER — Ambulatory Visit (INDEPENDENT_AMBULATORY_CARE_PROVIDER_SITE_OTHER): Payer: PRIVATE HEALTH INSURANCE | Admitting: Internal Medicine

## 2015-05-24 VITALS — BP 117/81 | HR 77 | Temp 98.6°F | Wt 310.0 lb

## 2015-05-24 DIAGNOSIS — R1030 Lower abdominal pain, unspecified: Secondary | ICD-10-CM | POA: Diagnosis not present

## 2015-05-24 DIAGNOSIS — B3731 Acute candidiasis of vulva and vagina: Secondary | ICD-10-CM

## 2015-05-24 DIAGNOSIS — N898 Other specified noninflammatory disorders of vagina: Secondary | ICD-10-CM | POA: Diagnosis not present

## 2015-05-24 DIAGNOSIS — B373 Candidiasis of vulva and vagina: Secondary | ICD-10-CM

## 2015-05-24 LAB — POCT WET PREP (WET MOUNT): Clue Cells Wet Prep Whiff POC: NEGATIVE

## 2015-05-24 MED ORDER — METRONIDAZOLE 500 MG PO TABS: 500.0000 mg | ORAL_TABLET | Freq: Two times a day (BID) | ORAL | Status: DC

## 2015-05-24 MED ORDER — TRAMADOL HCL 50 MG PO TABS: 50.0000 mg | ORAL_TABLET | Freq: Three times a day (TID) | ORAL | Status: DC | PRN

## 2015-05-24 MED ORDER — FLUCONAZOLE 150 MG PO TABS: ORAL_TABLET | ORAL | Status: DC

## 2015-05-24 MED ORDER — MELOXICAM 15 MG PO TABS: ORAL_TABLET | ORAL | Status: DC

## 2015-05-24 NOTE — Patient Instructions (Signed)
Ms. Gloor,  I will call you with the results of your test later today. If positive for BV, I recommend taking a week of the metronidazole twice daily for a week, followed by using metrogel twice a week for 6 months. I have also prescribed diflucan if you develop vaginal itching.  I will refer you to gynecology to assess your fibroids. Expect a call within the week.  Best, Dr. Ola Spurr

## 2015-05-24 NOTE — Telephone Encounter (Signed)
Informed patient of normal wet prep result. Recommended taking diflucan despite normal results due to thick white discharge on today's exam. Recommended against taking metronidazole at this time.

## 2015-05-24 NOTE — Progress Notes (Signed)
   Subjective:    Patient ID: Carla Hodges, female    DOB: Mar 21, 1980, 35 y.o.   MRN: [id number]  HPI  Naomi Birnbaum is a 34-y/o female with history of obesity, fibroids, and recurrent BV. She presents for concern about increased vaginal discharge and abdominal pain.  Vaginal discharge: - Was treated with metrogel x 5 days for suspected BV at the beginning of April. She reports bad odor resolved but that she has been having a "ball" of discharge usually in the mornings and at night.  - Has not had sex since March. Her partner has been in jail since April.  - Took diflucan a few days ago, thinking she could have a yeast infection with the increased discharge.  - Denies dysuria or return of odor.  - Denies vaginal itching.   Abdominal pain: - Thinks it is due to known fibroids.  - Pain is worse when she is having her period. - Has a full week of bleeding with her menstrual cycle with more "leaking" in between over the last couple of months. - Mobic helps some, but she has been using a friend's tramadol when the pain is at its worst.  - Transvaginal U/S 06/09/13 showed "fundal intramural fibroid with mass effect upon the adjacent endometrium measures 3.2 x 2.6 x 2.5 cm" with increase in size of posterior uterine body.  - Patient is also interested in becoming pregnant in the near future and is concerned about how the fibroid would affect fertility.  Weight gain: - Patient has gained about 20 pounds since the end of last year. - Says she knows it is due to her eating and lack of exercise. Says chips are her weakness. - Wants to get liposuction. A friend had the procedure and liked her results.   Review of Systems  Constitutional: Negative for fever.  Gastrointestinal: Positive for abdominal pain. Negative for nausea, vomiting and blood in stool.  Genitourinary: Positive for vaginal discharge. Negative for dysuria and flank pain.   Social: Former smoker    Objective:   Physical  Exam  Constitutional:  Pleasant female, obese, in NAD  Abdominal: Soft. She exhibits no distension. There is tenderness (lower abdomen). There is no rebound and no guarding.  Genitourinary:  Small amount of thick, white discharge on speculum exam. No lesions noted. No cervical motion tenderness on bimanual exam.  Skin: Skin is warm and dry. No rash noted.      Assessment & Plan:  Patient presents with concern about vaginal discharge. Discharge does appear thick on exam but is not copious. No cervical motion tenderness or fever to suggest PID. Abdominal tenderness that is worse with menstruation and increase in vaginal bleeding is concerning for worsening fibroids.  Vaginal discharge - Obtained wet prep. - Recommended treatment with diflucan given appearance of discharge. - Will call patient with results of wet prep. If BV is present, will recommend 7 day course of metronidazole, followed by 6 months of twice weekly use of metrogel.  Fibroid - Will refer to gynecology to reassess fibroids, as removal may be indicated. - Prescribed tramadol 50 mg (#30) for pain control until seen by gynecology.   OBESITY, HX OF - Advised that improving diet and increasing exercise would be a better long-term solution for obesity and would help with fertility, as well. - Advised patient if she pursues liposuction to seek out a Visual merchandiser.    Olene Floss, MD [facility], PGY-1

## 2015-05-26 DIAGNOSIS — N898 Other specified noninflammatory disorders of vagina: Secondary | ICD-10-CM | POA: Insufficient documentation

## 2015-05-26 NOTE — Assessment & Plan Note (Signed)
-   Advised that improving diet and increasing exercise would be a better long-term solution for obesity and would help with fertility, as well. - Advised patient if she pursues liposuction to seek out a Visual merchandiser.

## 2015-05-26 NOTE — Assessment & Plan Note (Signed)
-   Will refer to gynecology to reassess fibroids, as removal may be indicated. - Prescribed tramadol 50 mg (#30) for pain control until seen by gynecology.

## 2015-05-26 NOTE — Assessment & Plan Note (Signed)
-   Obtained wet prep. - Recommended treatment with diflucan given appearance of discharge. - Will call patient with results of wet prep. If BV is present, will recommend 7 day course of metronidazole, followed by 6 months of twice weekly use of metrogel.

## 2015-06-09 ENCOUNTER — Ambulatory Visit (INDEPENDENT_AMBULATORY_CARE_PROVIDER_SITE_OTHER): Payer: PRIVATE HEALTH INSURANCE | Admitting: Internal Medicine

## 2015-06-09 VITALS — BP 118/88 | HR 92 | Temp 98.1°F | Wt 315.6 lb

## 2015-06-09 DIAGNOSIS — R1030 Lower abdominal pain, unspecified: Secondary | ICD-10-CM | POA: Diagnosis not present

## 2015-06-09 DIAGNOSIS — M25561 Pain in right knee: Secondary | ICD-10-CM

## 2015-06-09 MED ORDER — TRAMADOL HCL 50 MG PO TABS: 50.0000 mg | ORAL_TABLET | Freq: Three times a day (TID) | ORAL | Status: DC | PRN

## 2015-06-09 NOTE — Patient Instructions (Addendum)
I would not recommend long drives as this may exacerbate the pain in your knees. Please follow with Dr. Ola Spurr regarding this issue.

## 2015-06-09 NOTE — Progress Notes (Signed)
   [facility] Kerrin Mo, MD Phone: [phone]  Reason For Visit: Same day visit for knee pain    #  Pain in knees. Gained a lot of weight recently. Now with worsening pain in knees after gaining weight. Pain is worse in the right knee. No hx of trauma. Patient has had knee pain in the past. She has taken Mobic. She also took her friends diclofenac for her pain. She has been prescribed tramadol in the past which improved the pain somewhat   Objective: BP 118/88 mmHg  Pulse 92  Temp(Src) 98.1 F (36.7 C) (Oral)  Wt 315 lb 9.6 oz (143.155 kg)  LMP 05/03/2015 Gen: NAD, alert, cooperative with exam Knee: Normal range of motion in both right and left knee, crepitus in both knee joints. No swelling, warmth, or erythema, tenderness to lateral right knee, adjacent to the patella  Skin: no rashes no lesions  Assessment/Plan: See problem based a/p  Knee pain, right Right knee pain, associated with sitting, walking up and downstairs. Per patient exacerbated by patient's continued weight gain. No gross abnormalities to the knee joint itself, no signs of infection. Patient morbidly obese, however to young for osteoarthritis. 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

## 2015-06-10 DIAGNOSIS — M25561 Pain in right knee: Secondary | ICD-10-CM | POA: Insufficient documentation

## 2015-06-10 NOTE — Assessment & Plan Note (Addendum)
Right knee pain, associated with sitting, walking up and downstairs. Per patient exacerbated by patient's continued weight gain. No gross abnormalities to the knee joint itself, no signs of infection. Patient morbidly obese, however to young for osteoarthritis. 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

## 2015-08-08 ENCOUNTER — Ambulatory Visit (INDEPENDENT_AMBULATORY_CARE_PROVIDER_SITE_OTHER): Payer: Self-pay | Admitting: Internal Medicine

## 2015-08-08 ENCOUNTER — Encounter: Payer: Self-pay | Admitting: Internal Medicine

## 2015-08-08 VITALS — BP 117/80 | HR 82 | Temp 98.2°F | Ht 65.0 in | Wt 297.0 lb

## 2015-08-08 DIAGNOSIS — R1030 Lower abdominal pain, unspecified: Secondary | ICD-10-CM

## 2015-08-08 DIAGNOSIS — H6123 Impacted cerumen, bilateral: Secondary | ICD-10-CM

## 2015-08-08 MED ORDER — MELOXICAM 15 MG PO TABS: ORAL_TABLET | ORAL | 0 refills | Status: DC

## 2015-08-08 MED ORDER — TRAMADOL HCL 50 MG PO TABS: 50.0000 mg | ORAL_TABLET | Freq: Three times a day (TID) | ORAL | 0 refills | Status: DC | PRN

## 2015-08-08 NOTE — Progress Notes (Signed)
[facility] Progress Note  Subjective:  Carla Hodges is a 34-y/o female who presents for ear wax buildup and continued dysmenorrhea.  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 - Has been taking mobic and tramadol for painful cramping with her periods - Pain and bleeding has not worsened since last visit ROS: No increased fatigue, dizziness  Obesity: - Pt going to Bariatric Clinic since 06/16/15 - Has lost over 15 lbs since last clinic visit - Reports she is taking amphetamines through the clinic for weight loss - Was evaluated by [facility] Plastic Surgeon 07/10/15 and told she needed to lose weight prior to being considered for body contouring ROS: Denies chest pain or chest palpitations  Social: Former smoker  Objective: Blood pressure 117/80, pulse 82, temperature 98.2 F (36.8 C), temperature source Oral, height 5\' 5"  (1.651 m), weight 297 lb (134.7 kg). Constitutional: Obese female, pleasant, in NAD HEENT: Cerumen impaction 100% on L, 90% on R. After irrigation, TMs visualized and were normal bilaterally. Cardiovascular: RRR, S1, S2, no m/r/g.  Pulmonary/Chest: Effort normal and breath sounds normal. No respiratory distress.  Abdominal: Soft. +BS, NT, ND, no rebound or guarding.  Musculoskeletal: ** Neurological: AOx3, no focal deficits. Skin: Skin is warm and dry. No rash noted. No erythema.  Psychiatric: Normal mood and affect.  Vitals reviewed  Assessment/Plan: Cerumen impaction -  Ears successfully irrigated - Suggested trying Debrox in several months and avoiding q-tip use  Lower abdominal pain - Stable from last visit without abdominal pain on exam - Will refer to gynecology as soon as patient re-enrolls in [employer], given her concerns about appointment costs  Call once re-enrolled in [employer] for gynecology referral to be placed.   Olene Floss, MD [facility], PGY-2

## 2015-08-08 NOTE — Patient Instructions (Signed)
Ms. Rear,  You may want to try Debrox ear wax softener in several months to prevent wax from building up.  Please call when you get cleared for orange card so that I may refer you to Gynecology.  Best, Dr. Ola Spurr

## 2015-08-09 DIAGNOSIS — R1030 Lower abdominal pain, unspecified: Secondary | ICD-10-CM | POA: Insufficient documentation

## 2015-08-09 DIAGNOSIS — H6120 Impacted cerumen, unspecified ear: Secondary | ICD-10-CM | POA: Insufficient documentation

## 2015-08-09 NOTE — Assessment & Plan Note (Signed)
-   Stable from last visit without abdominal pain on exam - Will refer to gynecology as soon as patient re-enrolls in [employer], given her concerns about appointment costs

## 2015-08-09 NOTE — Assessment & Plan Note (Signed)
-   Ears successfully irrigated - Suggested trying Debrox in several months and avoiding q-tip use

## 2015-08-10 ENCOUNTER — Ambulatory Visit: Payer: PRIVATE HEALTH INSURANCE | Attending: Internal Medicine

## 2015-09-13 ENCOUNTER — Telehealth: Payer: Self-pay | Admitting: *Deleted

## 2015-09-13 ENCOUNTER — Ambulatory Visit: Payer: PRIVATE HEALTH INSURANCE | Admitting: Family Medicine

## 2015-09-13 DIAGNOSIS — N760 Acute vaginitis: Principal | ICD-10-CM

## 2015-09-13 DIAGNOSIS — B9689 Other specified bacterial agents as the cause of diseases classified elsewhere: Secondary | ICD-10-CM

## 2015-09-13 DIAGNOSIS — L739 Follicular disorder, unspecified: Secondary | ICD-10-CM

## 2015-09-13 MED ORDER — CLINDAMYCIN HCL 300 MG PO CAPS: 300.0000 mg | ORAL_CAPSULE | Freq: Two times a day (BID) | ORAL | 0 refills | Status: DC

## 2015-09-13 NOTE — Telephone Encounter (Signed)
Patient in nurse clinic requesting to see her PCP for bacterial  Vaginosis symptoms.  Advised patient that PCP was working clinic, however she had two scheduled patients checked to be seen and would have to wait, if PCP agreed to see her.  Another provider had an opening today at 3 PM, but patient declined stating she had another appointment at 3 today.  Pt requested if PCP would send in refill for clindamycin for recurrent BV.  Precept with Dr. Ola Spurr; agreed to send in clindamycin and patient would need to follow up with her soon.  Appointment scheduled for 09/19/15 at 2:15 PM.  Medication sent to [facility].  Derl Barrow, RN

## 2015-09-19 ENCOUNTER — Ambulatory Visit: Payer: PRIVATE HEALTH INSURANCE | Admitting: Internal Medicine

## 2015-09-25 ENCOUNTER — Ambulatory Visit: Payer: PRIVATE HEALTH INSURANCE | Admitting: Internal Medicine

## 2015-09-28 ENCOUNTER — Ambulatory Visit (INDEPENDENT_AMBULATORY_CARE_PROVIDER_SITE_OTHER): Payer: Self-pay | Admitting: Internal Medicine

## 2015-09-28 ENCOUNTER — Encounter: Payer: Self-pay | Admitting: Internal Medicine

## 2015-09-28 VITALS — BP 125/75 | HR 88 | Temp 98.1°F | Wt 285.0 lb

## 2015-09-28 DIAGNOSIS — B9689 Other specified bacterial agents as the cause of diseases classified elsewhere: Secondary | ICD-10-CM

## 2015-09-28 DIAGNOSIS — A499 Bacterial infection, unspecified: Secondary | ICD-10-CM

## 2015-09-28 DIAGNOSIS — D259 Leiomyoma of uterus, unspecified: Secondary | ICD-10-CM

## 2015-09-28 DIAGNOSIS — R1030 Lower abdominal pain, unspecified: Secondary | ICD-10-CM

## 2015-09-28 DIAGNOSIS — N760 Acute vaginitis: Secondary | ICD-10-CM

## 2015-09-28 DIAGNOSIS — G479 Sleep disorder, unspecified: Secondary | ICD-10-CM

## 2015-09-28 MED ORDER — MELOXICAM 15 MG PO TABS: ORAL_TABLET | ORAL | 0 refills | Status: DC

## 2015-09-28 NOTE — Progress Notes (Signed)
[facility] Progress Note  Subjective:  Carla Hodges is a 35-y/o female with history of fibroids and recurrent BV. She is here for further work-up of fibroids.  Fibroids: - Known fundal intramural fibroid (U/S 06/2013) with dysmenorrhea - Has been waiting on [employer] Card coverage to be referred to gynecology; now has coverage and would like to pursue further work-up - Abdominal pain sometimes wakes her up in the middle of the night  - Cramping occurs all month but worst with menstruation - Meloxicam and tramadol help with pain - Has been trying to limit carbohydrate intake, as she has read on the internet that carbs can worsen pain due to fibroids ROS: No increased fatigue, does have constipation  Trouble sleeping: - Is receiving a stimulant (phentermine?) through bariatric clinic - Mostly has trouble falling asleep rather than staying awake ROS: No chest pain  Weight loss: - Supervised by bariatric clinic - Taking stimulant (phentermine?) and receiving hcg shots - Working out at [employer], mostly doing [employer] - Plans to get a Physiological scientist - Has cut out soda and meat. Rarely eats.  - Notes knee pain has improved greatly  - Motivation initially was to get liposuction, as was told would need to lose weight before being considered for procedure.   Recurrent BV: - Not complaining of increased vaginal discharge at today's visit. Recently completed course of clindamycin for presumed BV. Discussed that wet prep is needed before giving another course of antibiotics if has increased discharge again.   Social: Former smoker  Objective: Blood pressure 125/75, pulse 88, temperature 98.1 F (36.7 C), temperature source Oral, weight 285 lb (129.3 kg), last menstrual period 09/18/2015. Body mass index is 47.43 kg/m. Constitutional: Obese female, in NAD Cardiovascular: RRR, S1, S2, no m/r/g.  Pulmonary/Chest: Effort normal and breath sounds normal. No respiratory  distress.  Abdominal: Soft. +BS, TTP over RLQ and umbilical region, ND, no rebound or guarding.  Musculoskeletal: No LE edema Psychiatric: Normal mood and affect.  Vitals reviewed  Has lost 30 lbs since visit 06/09/15  Assessment/Plan: Fibroid - Chronic pain persists. - Ordered transvaginal and pelvic U/S to assess for enlargement of known intramural fibroid. - Placed referral to gynecology   Trouble in sleeping - Recommended discussing stimulant use with bariatric clinic - Recommended discontinuing stimulant; pt said she would discuss decreasing dose  OBESITY, HX OF - Has lost 30 pounds in last 3 months - Congratulated patient on her progress. She is motivated to continue to lose weight.   BV (bacterial vaginosis) - Not having increased discharge at this time or smell.   Follow-up after gynecology appointment.  Olene Floss, MD [place], PGY-2

## 2015-09-28 NOTE — Patient Instructions (Signed)
Ms. Mall,  I placed referral to gynecology. You should get a call within the week.  Please follow up after your gynecology appointment.  Best, Dr. Ola Spurr

## 2015-09-30 DIAGNOSIS — G479 Sleep disorder, unspecified: Secondary | ICD-10-CM | POA: Insufficient documentation

## 2015-09-30 NOTE — Assessment & Plan Note (Signed)
-   Has lost 30 pounds in last 3 months - Congratulated patient on her progress. She is motivated to continue to lose weight.

## 2015-09-30 NOTE — Assessment & Plan Note (Signed)
-   Not having increased discharge at this time or smell.

## 2015-09-30 NOTE — Assessment & Plan Note (Addendum)
-   Recommended discussing stimulant use with bariatric clinic - Recommended discontinuing stimulant; pt said she would discuss decreasing dose

## 2015-09-30 NOTE — Assessment & Plan Note (Signed)
-   Chronic pain persists. - Ordered transvaginal and pelvic U/S to assess for enlargement of known intramural fibroid. - Placed referral to gynecology

## 2015-10-16 ENCOUNTER — Ambulatory Visit (HOSPITAL_COMMUNITY): Payer: PRIVATE HEALTH INSURANCE

## 2015-10-25 ENCOUNTER — Ambulatory Visit: Payer: PRIVATE HEALTH INSURANCE | Attending: Internal Medicine

## 2015-11-02 ENCOUNTER — Ambulatory Visit (HOSPITAL_COMMUNITY)
Admission: RE | Admit: 2015-11-02 | Discharge: 2015-11-02 | Disposition: A | Discharge status: Home / Self Care | Payer: PRIVATE HEALTH INSURANCE | Source: Ambulatory Visit | Attending: Family Medicine | Admitting: Family Medicine

## 2015-11-02 DIAGNOSIS — D259 Leiomyoma of uterus, unspecified: Secondary | ICD-10-CM

## 2015-11-02 DIAGNOSIS — D251 Intramural leiomyoma of uterus: Secondary | ICD-10-CM | POA: Insufficient documentation

## 2015-11-02 DIAGNOSIS — R102 Pelvic and perineal pain: Secondary | ICD-10-CM | POA: Insufficient documentation

## 2015-11-02 DIAGNOSIS — G8929 Other chronic pain: Secondary | ICD-10-CM | POA: Insufficient documentation

## 2015-11-06 ENCOUNTER — Ambulatory Visit (INDEPENDENT_AMBULATORY_CARE_PROVIDER_SITE_OTHER): Payer: Self-pay | Admitting: Internal Medicine

## 2015-11-06 ENCOUNTER — Encounter: Payer: Self-pay | Admitting: Internal Medicine

## 2015-11-06 VITALS — BP 129/87 | HR 105 | Temp 98.1°F | Wt 282.2 lb

## 2015-11-06 DIAGNOSIS — N63 Unspecified lump in unspecified breast: Secondary | ICD-10-CM

## 2015-11-06 HISTORY — DX: Unspecified lump in unspecified breast: N63.0

## 2015-11-06 NOTE — Progress Notes (Signed)
   Subjective:    Patient ID: Carla Hodges, female    DOB: May 02, 1980, 35 y.o.   MRN: [id number]  HPI  Patient presents for same day appointment for lump on L breast.  L breast lump Patient first noticed today. Not pain. No skin changes, including thickening, stippling, stretching. Denies nipple discharge or bleeding. No abnormalities of other breast. No history of breath lumps or abnormalities. LMP 10/07. Takes hormonal contraceptive [facility]   Smoking status reviewed.   Review of Systems See HPI.     Objective:   Physical Exam  Constitutional: She is oriented to person, place, and time. No distress.  Overweight female  HENT:  Head: Normocephalic and atraumatic.  Eyes: Conjunctivae and EOM are normal. Right eye exhibits no discharge. Left eye exhibits no discharge.  Pulmonary/Chest: Effort normal. No respiratory distress.  Neurological: She is alert and oriented to person, place, and time.  Psychiatric: She has a normal mood and affect. Her behavior is normal.      Assessment & Plan:  Breast nodule Of L breast. First noted today. No overlying skin changes. Firm, round, mobile. No nipple discharge or bleeding.  Patient's insurance unfortunately will not cover diagnostic mammogram or ultrasound at this time, so patient would have to pay out of pocket and reports that she is not able to do that at this time. Patient provided information for scheduling these appointments when she is able, and encouraged to do so as soon as she is able.   Adin Hector, MD, MPH PGY-2 [facility] Pager [phone]

## 2015-11-06 NOTE — Assessment & Plan Note (Addendum)
Of L breast. First noted today. No overlying skin changes. Firm, round, mobile. No nipple discharge or bleeding.  Patient's insurance unfortunately will not cover diagnostic mammogram or ultrasound at this time, so patient would have to pay out of pocket and reports that she is not able to do that at this time. Patient provided information for scheduling these appointments when she is able, and encouraged to do so as soon as she is able.

## 2015-11-06 NOTE — Patient Instructions (Addendum)
It was nice meeting you today Carla Hodges!  It is very important to go to your mammogram and breast ultrasound appointment at the [facility].   If you have any questions or concerns, please feel free to call the clinic.   Be well,  Dr. Avon Gully

## 2015-11-07 ENCOUNTER — Other Ambulatory Visit (HOSPITAL_COMMUNITY): Payer: Self-pay | Admitting: *Deleted

## 2015-11-07 DIAGNOSIS — N6320 Unspecified lump in the left breast, unspecified quadrant: Secondary | ICD-10-CM

## 2015-11-13 ENCOUNTER — Other Ambulatory Visit: Payer: Self-pay | Admitting: Internal Medicine

## 2015-11-13 DIAGNOSIS — R1030 Lower abdominal pain, unspecified: Secondary | ICD-10-CM

## 2015-11-14 ENCOUNTER — Encounter: Payer: Self-pay | Admitting: Internal Medicine

## 2015-11-23 ENCOUNTER — Ambulatory Visit (INDEPENDENT_AMBULATORY_CARE_PROVIDER_SITE_OTHER): Payer: PRIVATE HEALTH INSURANCE | Admitting: Obstetrics & Gynecology

## 2015-11-23 ENCOUNTER — Other Ambulatory Visit (HOSPITAL_COMMUNITY)
Admission: RE | Admit: 2015-11-23 | Discharge: 2015-11-23 | Disposition: A | Discharge status: Home / Self Care | Payer: Self-pay | Source: Ambulatory Visit | Attending: Obstetrics & Gynecology | Admitting: Obstetrics & Gynecology

## 2015-11-23 ENCOUNTER — Encounter: Payer: Self-pay | Admitting: Obstetrics & Gynecology

## 2015-11-23 VITALS — BP 105/73 | HR 95 | Wt 271.9 lb

## 2015-11-23 DIAGNOSIS — N938 Other specified abnormal uterine and vaginal bleeding: Secondary | ICD-10-CM | POA: Insufficient documentation

## 2015-11-23 DIAGNOSIS — Z113 Encounter for screening for infections with a predominantly sexual mode of transmission: Secondary | ICD-10-CM

## 2015-11-23 DIAGNOSIS — Z3202 Encounter for pregnancy test, result negative: Secondary | ICD-10-CM

## 2015-11-23 LAB — CBC
HCT: 36.2 % (ref 35.0–45.0)
HEMOGLOBIN: 12.1 g/dL (ref 11.7–15.5)
MCH: 29.3 pg (ref 27.0–33.0)
MCHC: 33.4 g/dL (ref 32.0–36.0)
MCV: 87.7 fL (ref 80.0–100.0)
MPV: 9.9 fL (ref 7.5–12.5)
Platelets: 359 10*3/uL (ref 140–400)
RBC: 4.13 MIL/uL (ref 3.80–5.10)
RDW: 14.2 % (ref 11.0–15.0)
WBC: 5.9 10*3/uL (ref 3.8–10.8)

## 2015-11-23 LAB — TSH: TSH: 1.05 mIU/L

## 2015-11-23 LAB — POCT PREGNANCY, URINE: PREG TEST UR: NEGATIVE

## 2015-11-23 NOTE — Progress Notes (Signed)
   Subjective:    Patient ID: Carla Hodges, female    DOB: [birth year], 35 y.o.   MRN: [id number]  HPI  35 yo SAA P0 here today to discuss her DUB. She has a regular period each month, lasting about 7 days. But then she will bleed throughout the month. This has been going on for about 4 months but has met a man recently and may become sexually active. She declines contraception.  An u/s last week showed a 3.4 cm posterior fibroid.  Review of Systems She declines a flu vaccine.    Objective:   Physical Exam Obese pleasant BFNAD Breathing, conversing, and ambulating normally  UPT negative, consent signed, time out done Cervix prepped with betadine and grasped with a single tooth tenaculum Uterus sounded to 10 cm Pipelle used for 2 passes with a moderate amount of tissue obtained. She tolerated the procedure well.      Assessment & Plan:  DUB- check CBC, TSH, cervical cultures, await embx results RTC 1 week for results

## 2015-11-24 LAB — GC/CHLAMYDIA PROBE AMP (~~LOC~~) NOT AT ARMC
Chlamydia: NEGATIVE
Neisseria Gonorrhea: NEGATIVE

## 2015-11-28 ENCOUNTER — Ambulatory Visit (HOSPITAL_COMMUNITY)
Admission: RE | Admit: 2015-11-28 | Discharge: 2015-11-28 | Disposition: A | Discharge status: Home / Self Care | Payer: Self-pay | Source: Ambulatory Visit | Attending: Obstetrics and Gynecology | Admitting: Obstetrics and Gynecology

## 2015-11-28 ENCOUNTER — Ambulatory Visit
Admission: RE | Admit: 2015-11-28 | Discharge: 2015-11-28 | Disposition: A | Discharge status: Home / Self Care | Payer: No Typology Code available for payment source | Source: Ambulatory Visit | Attending: Obstetrics and Gynecology | Admitting: Obstetrics and Gynecology

## 2015-11-28 ENCOUNTER — Encounter (HOSPITAL_COMMUNITY): Payer: Self-pay

## 2015-11-28 VITALS — BP 118/76 | Temp 97.5°F | Ht 65.5 in | Wt 272.0 lb

## 2015-11-28 DIAGNOSIS — Z1239 Encounter for other screening for malignant neoplasm of breast: Secondary | ICD-10-CM

## 2015-11-28 DIAGNOSIS — N6320 Unspecified lump in the left breast, unspecified quadrant: Secondary | ICD-10-CM

## 2015-11-28 DIAGNOSIS — N6322 Unspecified lump in the left breast, upper inner quadrant: Secondary | ICD-10-CM

## 2015-11-28 NOTE — Patient Instructions (Signed)
Explained breast self awareness to [place]. Patient did not need a Pap smear today due to last Pap smear and HPV typing was 11/04/2014. Let her know [facility] will cover Pap smears and HPV typing every 5 years unless has a history of abnormal Pap smears. Referred patient to the [facility] for diagnostic mammogram and possible left breast ultrasound. Appointment scheduled for Tuesday, November 28, 2015 at 1250. Patient aware of appointment and will be there. [place] verbalized understanding.  Azarion Hove, Arvil [place], RN 1:02 PM

## 2015-11-28 NOTE — Addendum Note (Signed)
Encounter addended by: Loletta Parish, RN on: 11/28/2015  1:16 PM<BR>    Actions taken: Sign clinical note

## 2015-11-28 NOTE — Progress Notes (Addendum)
Complaints of left breast lump x 1 month.   Pap Smear:  Pap smear not completed today. Last Pap smear was 11/04/2014 at [facility] and normal with negative HPV. Per patient has a history of an abnormal Pap smear 6 years or more ago that a repeat Pap smear was completed for follow up. Patient stated she has had a least three normal Pap smears in a row since abnormal Pap smear. Last Pap and multiple other Pap smear results are in EPIC.  Physical exam: Breasts Breasts symmetrical. No skin abnormalities bilateral breasts. Bilateral nipples slightly inverted that per patient is normal for her. No nipple discharge bilateral breasts. No lymphadenopathy. No lumps palpated right breast. Palpated a lump within the left breast at 11 o'clock next to areola. No complaints of pain or tenderness on exam. Referred patient to the [facility] for diagnostic mammogram and possible left breast ultrasound. Appointment scheduled for Tuesday, November 28, 2015 at 1250.        Pelvic/Bimanual No Pap smear completed today since last Pap smear and HPV typing was 11/04/2014. Pap smear not indicated per BCCCP guidelines.   Smoking History: Patient has never smoked.  Patient Navigation: Patient education provided. Access to services provided for patient through [facility] program.

## 2015-11-29 ENCOUNTER — Encounter (HOSPITAL_COMMUNITY): Payer: Self-pay | Admitting: *Deleted

## 2015-12-26 ENCOUNTER — Telehealth: Payer: Self-pay | Admitting: *Deleted

## 2015-12-26 NOTE — Telephone Encounter (Signed)
I called Lun back and left a message I am returning her call. Per chart review was supposed to have a follow up visit to discuss results and plan of care. Will forward message to registars to schedule with Dr. Hulan Fray and call patient. Will leave message in basket for nurses to call patient again to let her know Dr. Hulan Fray will discuss results and plan of care at this appointment that registars will call her about.

## 2015-12-26 NOTE — Telephone Encounter (Signed)
Carla Hodges called yesterday am and wants to know results of endometrial biopsy and what we are doing about her fibroids.

## 2015-12-26 NOTE — Telephone Encounter (Signed)
Carla Hodges called back and  Asked [place] to call her back. I called her and discussed her results of biopsy and plan for registar to call her with an appointment to come in and see Dr. Hulan Fray to discuss plan of care. She voices understanding.

## 2015-12-27 ENCOUNTER — Encounter: Payer: Self-pay | Admitting: Family Medicine

## 2015-12-27 ENCOUNTER — Ambulatory Visit (INDEPENDENT_AMBULATORY_CARE_PROVIDER_SITE_OTHER): Payer: No Typology Code available for payment source | Admitting: Family Medicine

## 2015-12-27 VITALS — BP 90/68 | HR 70 | Temp 98.0°F | Ht 65.5 in | Wt 269.6 lb

## 2015-12-27 DIAGNOSIS — M222X2 Patellofemoral disorders, left knee: Secondary | ICD-10-CM

## 2015-12-27 HISTORY — DX: Patellofemoral disorders, left knee: M22.2X2

## 2015-12-27 MED ORDER — IBUPROFEN 200 MG PO TABS
ORAL_TABLET | ORAL | 0 refills | Status: DC
Start: 2015-12-27 — End: 2016-06-08

## 2015-12-27 NOTE — Progress Notes (Signed)
    Subjective:  Carla Hodges is a 35 y.o. female who presents to the [facility] today with a chief complaint of L knee pain.   HPI: L knee pain - Started exercising over the last 3-4 months with squats, jumping jacks, etc. and has noticed that started gradually having L knee pain over kneecap with audible popping and grinding sensation.  - Worse with sitting for long periods, going down steps.  - Has not tried taking OTC medications.  - Denies trauma, weakness, numbness/tingling.  Pelvic pain and passing clots - Per chart review, patient has been seeing OBGyn at [facility] for a uterine fibroid with recent endometrial biopsy - Patient states is conerned because continuing to pass clots and has not heard back about appointment to discuss plan of care. Is willing to call back about appointment with Dr. Hulan Fray.  - Denies lightheadedness, dizziness, syncope, palpitations.  ROS: Per HPI  Objective:  Physical Exam: BP 90/68 (BP Location: Right Arm, Patient Position: Sitting, Cuff Size: Normal)   Pulse 70   Temp 98 F (36.7 C) (Oral)   Ht 5' 5.5" (1.664 m)   Wt 269 lb 9.6 oz (122.3 kg)   LMP 12/10/2015 (Exact Date)   SpO2 97%   BMI 44.18 kg/m   Gen: obese, NAD, resting comfortably CV: RRR with no murmurs appreciated MSK: Tender to palpation over patellar tendon. No ligamentous laxity. Popping audible with active ROM. ROM normal. Positive patellar apprehension test. Neg lachmans. Strength 5/5 bilaterally in LE. Skin: warm, dry Neuro: grossly normal, moves all extremities Psych: Normal affect and thought content    Assessment/Plan:  Patellofemoral pain syndrome of left knee L knee pain around and over patella in the setting of overuse in weight bearing exercises with worsening with prolonged sitting is highly consistent with patellofemoral pain syndrome. - Patient given home exercises and advised to try NSAIDs before exercising.  - To follow up in 3-4 weeks if not improving.       Bufford Lope, DO PGY-1, [facility] 12/27/2015 3:19 PM

## 2015-12-27 NOTE — Patient Instructions (Signed)
It was good to meet you today.  For your knee pain,  - Please try home exercises and ibuprofen as needed (particularly before exercising) - Follow up with your doctor if there is no improvement or worsens in 3-4 weeks  For your breast lump, - Call and schedule your mammogram  For your pelvic pain - Please call your OBGyn office   Take care and seek immediate care sooner if you develop any concerns.   Dr. Bufford Lope, [place]

## 2015-12-27 NOTE — Assessment & Plan Note (Addendum)
L knee pain around and over patella in the setting of overuse in weight bearing exercises with worsening with prolonged sitting is highly consistent with patellofemoral pain syndrome. - Patient given home exercises and advised to try NSAIDs before exercising.  - To follow up in 3-4 weeks if not improving.

## 2015-12-29 ENCOUNTER — Encounter: Payer: Self-pay | Admitting: Obstetrics & Gynecology

## 2016-01-09 ENCOUNTER — Other Ambulatory Visit: Payer: No Typology Code available for payment source

## 2016-01-10 ENCOUNTER — Ambulatory Visit
Admission: RE | Admit: 2016-01-10 | Discharge: 2016-01-10 | Disposition: A | Discharge status: Home / Self Care | Payer: No Typology Code available for payment source | Source: Ambulatory Visit | Attending: Family Medicine | Admitting: Family Medicine

## 2016-01-10 DIAGNOSIS — N63 Unspecified lump in unspecified breast: Secondary | ICD-10-CM

## 2016-01-17 ENCOUNTER — Other Ambulatory Visit (HOSPITAL_COMMUNITY): Payer: Self-pay | Admitting: *Deleted

## 2016-01-17 DIAGNOSIS — N6320 Unspecified lump in the left breast, unspecified quadrant: Secondary | ICD-10-CM

## 2016-01-18 ENCOUNTER — Ambulatory Visit (INDEPENDENT_AMBULATORY_CARE_PROVIDER_SITE_OTHER): Payer: Self-pay | Admitting: Obstetrics & Gynecology

## 2016-01-18 VITALS — BP 118/74 | HR 86 | Ht 65.5 in | Wt 264.1 lb

## 2016-01-18 DIAGNOSIS — N946 Dysmenorrhea, unspecified: Secondary | ICD-10-CM

## 2016-01-18 DIAGNOSIS — R102 Pelvic and perineal pain: Secondary | ICD-10-CM

## 2016-01-18 DIAGNOSIS — N938 Other specified abnormal uterine and vaginal bleeding: Secondary | ICD-10-CM

## 2016-01-18 MED ORDER — NORGESTREL-ETHINYL ESTRADIOL 0.3-30 MG-MCG PO TABS: 1.0000 | ORAL_TABLET | Freq: Every day | ORAL | 11 refills | Status: DC

## 2016-01-18 MED ORDER — IBUPROFEN 800 MG PO TABS
800.0000 mg | ORAL_TABLET | Freq: Three times a day (TID) | ORAL | 1 refills | Status: DC | PRN
Start: 2016-01-18 — End: 2016-04-24

## 2016-01-18 NOTE — Progress Notes (Signed)
Pt states that ibuprofen doesn't do much for her pain. She takes "whatever she can get her hands on, the hardcore stuff". Pt did not specify exactly what she is using.

## 2016-01-18 NOTE — Progress Notes (Signed)
   Subjective:    Patient ID: Carla Hodges, female    DOB: [birth year], 36 y.o.   MRN: [id number]  HPI  36 yo S AA G0 here to discuss her EMBX results from 11/17. She has DUB, pelvic pain. An u/s 11/17 showed a 3.4 cm posterior fibroid and her EMBX was normal. Her periods are so painful, but she has pain all month long.   Review of Systems She is sexually active and uses latex free condoms. Graduated from [employer], looking for a job Uses narcotics from friends for her pelvic pain.    Objective:   Physical Exam  Pleasant obese BFNAD Breathing, conversing, and ambulating normally Abd- benign      Assessment & Plan:  Fibroid, DUB- rec OCPs to control her DUB Lo ovral prescribed RTC 4 months IBU prescribed

## 2016-01-23 ENCOUNTER — Other Ambulatory Visit (HOSPITAL_COMMUNITY)
Admission: RE | Admit: 2016-01-23 | Discharge: 2016-01-23 | Disposition: A | Discharge status: Home / Self Care | Payer: No Typology Code available for payment source | Source: Ambulatory Visit | Attending: Family Medicine | Admitting: Family Medicine

## 2016-01-23 ENCOUNTER — Encounter: Payer: Self-pay | Admitting: Internal Medicine

## 2016-01-23 ENCOUNTER — Ambulatory Visit (INDEPENDENT_AMBULATORY_CARE_PROVIDER_SITE_OTHER): Payer: Self-pay | Admitting: Internal Medicine

## 2016-01-23 VITALS — BP 116/78 | HR 76 | Temp 98.3°F | Ht 65.5 in | Wt 264.0 lb

## 2016-01-23 DIAGNOSIS — N898 Other specified noninflammatory disorders of vagina: Secondary | ICD-10-CM | POA: Insufficient documentation

## 2016-01-23 DIAGNOSIS — Z113 Encounter for screening for infections with a predominantly sexual mode of transmission: Secondary | ICD-10-CM | POA: Insufficient documentation

## 2016-01-23 LAB — POCT URINALYSIS DIPSTICK
BILIRUBIN UA: NEGATIVE
Blood, UA: NEGATIVE
Glucose, UA: NEGATIVE
KETONES UA: NEGATIVE
LEUKOCYTES UA: NEGATIVE
Nitrite, UA: NEGATIVE
PH UA: 6.5
Protein, UA: NEGATIVE
SPEC GRAV UA: 1.015
Urobilinogen, UA: 0.2

## 2016-01-23 LAB — POCT URINE PREGNANCY: Preg Test, Ur: NEGATIVE

## 2016-01-23 LAB — POCT WET PREP (WET MOUNT)
Clue Cells Wet Prep Whiff POC: NEGATIVE
TRICHOMONAS WET PREP HPF POC: ABSENT

## 2016-01-23 NOTE — Assessment & Plan Note (Signed)
-   POCT urinalysis dipstick - POCT urine pregnancy - POCT Wet Prep ([place]) - GC/Chlamydia probe amp ([place])not at [facility] - HIV antibody (with reflex) - Counseled to continue condoms until patient starts taking birth control

## 2016-01-23 NOTE — Progress Notes (Signed)
   [facility] Kerrin Mo, MD Phone: [phone]  Reason For Visit: SDA for Vaginal Odor   # VAGINAL Odor  Having vaginal odor for on  Sunday days.Patient has used Metronidazole and has used the gel in the past as well.  No vaginal discharge  Recent antibiotic use: none  Sex in last month: yes, one partner about 2 months.No condoms  Contraception: estrogen/progeston pill - has not started taking these pills yet  Possible STD exposure: would not mind be checked for STDS  Symptoms Fever: no Dysuria:no Vaginal bleeding: no  Abdomen or Pelvic pain: Has abdominal chronically for several years associated with her fibroid, nothing new or changed  Genital sores or ulcers:no Pain during sex: no  Missed menstrual period: no   ROS see HPI Smoking Status noted   Past Medical History Reviewed problem list.  Medications- reviewed and updated No additions to family history Social history- patient is a non-smoker  Objective: BP 116/78 (BP Location: Left Arm, Patient Position: Sitting, Cuff Size: Large)   Pulse 76   Temp 98.3 F (36.8 C) (Oral)   Ht 5' 5.5" (1.664 m)   Wt 264 lb (119.7 kg)   LMP 01/05/2016 (Exact Date)   SpO2 99%   BMI 43.26 kg/m  Gen: NAD, alert, cooperative with exam GI: soft, non-tender, non-distended, bowel sounds present, no hepatomegaly, no splenomegaly GU: external vaginal tissue wnl, cervix wnl, no punctate lesions on cervix appreciated, no discharge from cervical os,slight  Bleeding from cervical os, no cervical motion tenderness, no abdominal/ adnexal masses   Assessment/Plan: See problem based a/p  Vaginal odor  - POCT urinalysis dipstick - POCT urine pregnancy - POCT Wet Prep ([place]) - GC/Chlamydia probe amp ([place])not at [facility] - HIV antibody (with reflex) - Counseled to continue condoms until patient starts taking birth control

## 2016-01-23 NOTE — Patient Instructions (Signed)
I will let you know the results of the labs and call in medications as need. Please try to use condoms until you start birth control.   Bacterial Vaginosis Bacterial vaginosis is an infection of the vagina. It happens when too many germs (bacteria) grow in the vagina. This infection puts you at risk for infections from sex (STIs). Treating this infection can lower your risk for some STIs. You should also treat this if you are pregnant. It can cause your baby to be born early. Follow these instructions at home: Medicines  Take over-the-counter and prescription medicines only as told by your doctor.  Take or use your antibiotic medicine as told by your doctor. Do not stop taking or using it even if you start to feel better. General instructions  If you your sexual partner is a woman, tell her that you have this infection. She needs to get treatment if she has symptoms. If you have a female partner, he does not need to be treated.  During treatment:  Avoid sex.  Do not douche.  Avoid alcohol as told.  Avoid breastfeeding as told.  Drink enough fluid to keep your pee (urine) clear or pale yellow.  Keep your vagina and butt (rectum) clean.  Wash the area with warm water every day.  Wipe from front to back after you use the toilet.  Keep all follow-up visits as told by your doctor. This is important. Preventing this condition  Do not douche.  Use only warm water to wash around your vagina.  Use protection when you have sex. This includes:  Latex condoms.  Dental dams.  Limit how many people you have sex with. It is best to only have sex with the same person (be monogamous).  Get tested for STIs. Have your partner get tested.  Wear underwear that is cotton or lined with cotton.  Avoid tight pants and pantyhose. This is most important in summer.  Do not use any products that have nicotine or tobacco in them. These include cigarettes and e-cigarettes. If you need help  quitting, ask your doctor.  Do not use illegal drugs.  Limit how much alcohol you drink. Contact a doctor if:  Your symptoms do not get better, even after you are treated.  You have more discharge or pain when you pee (urinate).  You have a fever.  You have pain in your belly (abdomen).  You have pain with sex.  Your bleed from your vagina between periods. Summary  This infection happens when too many germs (bacteria) grow in the vagina.  Treating this condition can lower your risk for some infections from sex (STIs).  You should also treat this if you are pregnant. It can cause early (premature) birth.  Do not stop taking or using your antibiotic medicine even if you start to feel better. This information is not intended to replace advice given to you by your health care provider. Make sure you discuss any questions you have with your health care provider. Document Released: 10/03/2007 Document Revised: 09/09/2015 Document Reviewed: 09/09/2015 Elsevier Interactive Patient Education  2017 [employer].

## 2016-01-24 LAB — HIV ANTIBODY (ROUTINE TESTING W REFLEX): HIV 1&2 Ab, 4th Generation: NONREACTIVE

## 2016-01-25 LAB — GC/CHLAMYDIA PROBE AMP (~~LOC~~) NOT AT ARMC
Chlamydia: NEGATIVE
NEISSERIA GONORRHEA: NEGATIVE

## 2016-01-26 ENCOUNTER — Telehealth: Payer: Self-pay | Admitting: Internal Medicine

## 2016-01-26 NOTE — Telephone Encounter (Signed)
Called patient to let her know the results. No need for treatment as all results were negative

## 2016-01-30 ENCOUNTER — Telehealth: Payer: Self-pay | Admitting: *Deleted

## 2016-01-30 NOTE — Telephone Encounter (Signed)
Patient left message on nurse voicemail on 01/29/16 at 1337.  Patient states she would like to speak with Dr. Hulan Fray.  Didn't specify what she needed to discuss.  Requests a return call to [phone].

## 2016-02-01 ENCOUNTER — Other Ambulatory Visit (HOSPITAL_COMMUNITY): Payer: Self-pay | Admitting: Obstetrics and Gynecology

## 2016-02-01 ENCOUNTER — Ambulatory Visit
Admission: RE | Admit: 2016-02-01 | Discharge: 2016-02-01 | Disposition: A | Discharge status: Home / Self Care | Payer: No Typology Code available for payment source | Source: Ambulatory Visit | Attending: Obstetrics and Gynecology | Admitting: Obstetrics and Gynecology

## 2016-02-01 ENCOUNTER — Ambulatory Visit (HOSPITAL_COMMUNITY)
Admission: RE | Admit: 2016-02-01 | Discharge: 2016-02-01 | Disposition: A | Discharge status: Home / Self Care | Payer: No Typology Code available for payment source | Source: Ambulatory Visit | Attending: Obstetrics and Gynecology | Admitting: Obstetrics and Gynecology

## 2016-02-01 ENCOUNTER — Encounter (HOSPITAL_COMMUNITY): Payer: Self-pay

## 2016-02-01 VITALS — BP 112/78 | Temp 97.8°F | Ht 65.5 in | Wt 262.6 lb

## 2016-02-01 DIAGNOSIS — N6320 Unspecified lump in the left breast, unspecified quadrant: Secondary | ICD-10-CM

## 2016-02-01 DIAGNOSIS — N6322 Unspecified lump in the left breast, upper inner quadrant: Secondary | ICD-10-CM

## 2016-02-01 DIAGNOSIS — Z1239 Encounter for other screening for malignant neoplasm of breast: Secondary | ICD-10-CM

## 2016-02-01 NOTE — Progress Notes (Addendum)
Complaints of left breast lump x 3 months that per patient has noticed it increasing in size since 3 weeks after her diagnostic mammogram and left breast ultrasound. Patient complained of pain at lump at times. Patient rates the pain at a 3 out of 10.  Pap Smear: Pap smear not completed today. Last Pap smear was 11/04/2014 at [facility] and normal with negative HPV. Per patient has a history of an abnormal Pap smear 6 years or more ago that a repeat Pap smear was completed for follow up. Patient stated she has had a least three normal Pap smears in a row since abnormal Pap smear. Last Pap and multiple other Pap smear results are in EPIC.  Physical exam: Breasts Breasts symmetrical. No skin abnormalities bilateral breasts. Bilateral nipples slightly inverted that per patient is normal for her. No nipple discharge bilateral breasts. No lymphadenopathy. No lumps palpated right breast. Palpated a lump within the left breast at 11 o'clock next to areola. No complaints of pain or tenderness on exam. Referred patient to the [facility] for left breast diagnostic mammogram and possible breast ultrasound. Appointment scheduled for Thursday, February 01, 2016 at 1330.        Pelvic/Bimanual No Pap smear completed today since last Pap smear and HPV typing was 11/04/2014. Pap smear not indicated per BCCCP guidelines.   Smoking History: Patient has never smoked.  Patient Navigation: Patient education provided. Access to services provided for patient through [facility] program.

## 2016-02-01 NOTE — Telephone Encounter (Signed)
Contacted pt and was informed that she had intercourse and then had bleeding on that Sunday when she wiped. Monday and Tuesday the bleeding picked up a little bit but then turned to brownish like old blood.  I asked the pt if she has started taking the BCP's she stated no but wanted to know if she could start taking them now.  I informed pt that her irregular bleeding is normal considering her history and that is purpose of the BCP.  Notified Dr. Nehemiah Settle, to see if pt could start taking BCP today.  Provider recommendation is that she could start today.  I informed pt provider's recommendation and to make sure that she takes it at the same time everyday to prevent pregnancy.  Pt stated understanding with no further questions.

## 2016-02-01 NOTE — Addendum Note (Signed)
Encounter addended by: Loletta Parish, RN on: 02/01/2016  1:31 PM<BR>    Actions taken: Sign clinical note

## 2016-02-01 NOTE — Patient Instructions (Addendum)
Explained breast self awareness to Carla Hodges. Patient did not need a Pap smear today due to last Pap smear and HPV typing was 11/04/2014. Let her know BCCCP will cover Pap smears and HPV typing every 5 years unless has a history of abnormal Pap smears. Referred patient to the [facility] for left breast diagnostic mammogram and possible breast ultrasound. Appointment scheduled for Thursday, February 01, 2016 at 1330. Carla Hodges verbalized understanding.  Carla Hodges, Carla Chaco, RN 1:23 PM

## 2016-02-01 NOTE — Addendum Note (Signed)
Encounter addended by: Loletta Parish, RN on: 02/01/2016  1:11 PM<BR>    Actions taken: Sign clinical note

## 2016-02-02 ENCOUNTER — Telehealth (HOSPITAL_COMMUNITY): Payer: Self-pay | Admitting: *Deleted

## 2016-02-02 NOTE — Telephone Encounter (Signed)
Telephoned patient at home number and advised patient BCCCP was not able to pay for breast aspiration due to not be recommended by physician. Patient going to call [facility] for cost of procedure. Patient voiced understanding.

## 2016-02-02 NOTE — Telephone Encounter (Signed)
Telephoned patient at home number and unable to leave message due to mailbox full.

## 2016-02-06 ENCOUNTER — Inpatient Hospital Stay: Admission: RE | Admit: 2016-02-06 | Payer: No Typology Code available for payment source | Source: Ambulatory Visit

## 2016-02-07 ENCOUNTER — Ambulatory Visit: Payer: No Typology Code available for payment source

## 2016-04-24 ENCOUNTER — Other Ambulatory Visit: Payer: Self-pay | Admitting: Obstetrics & Gynecology

## 2016-05-13 ENCOUNTER — Telehealth: Payer: Self-pay | Admitting: Internal Medicine

## 2016-05-13 NOTE — Telephone Encounter (Signed)
Pt has been diagnosed with a cyst in her breast.  Pt wanted to see her dr about this. She needs a referral to the breast center. Please advise

## 2016-05-13 NOTE — Telephone Encounter (Signed)
It looks like Dr. Elly Modena ordered an aspiration as the next step of this work-up but does not appear to have been scheduled yet. Please ask patient to call the Breast Center to follow-up this order. If she needs a new order placed, I will do so.

## 2016-05-14 NOTE — Telephone Encounter (Signed)
Patient informed that breast aspiration had already been ordered. She will call the breast center to schedule.

## 2016-05-21 ENCOUNTER — Ambulatory Visit
Admission: RE | Admit: 2016-05-21 | Discharge: 2016-05-21 | Disposition: A | Discharge status: Home / Self Care | Payer: No Typology Code available for payment source | Source: Ambulatory Visit | Attending: Obstetrics and Gynecology | Admitting: Obstetrics and Gynecology

## 2016-05-21 DIAGNOSIS — N6320 Unspecified lump in the left breast, unspecified quadrant: Secondary | ICD-10-CM

## 2016-05-30 ENCOUNTER — Ambulatory Visit (INDEPENDENT_AMBULATORY_CARE_PROVIDER_SITE_OTHER): Payer: Self-pay | Admitting: Obstetrics & Gynecology

## 2016-05-30 ENCOUNTER — Encounter: Payer: Self-pay | Admitting: Obstetrics & Gynecology

## 2016-05-30 VITALS — BP 117/80 | HR 85 | Wt 249.8 lb

## 2016-05-30 DIAGNOSIS — R102 Pelvic and perineal pain: Secondary | ICD-10-CM

## 2016-05-30 DIAGNOSIS — N938 Other specified abnormal uterine and vaginal bleeding: Secondary | ICD-10-CM

## 2016-05-30 NOTE — Progress Notes (Signed)
   Subjective:    Patient ID: Carla Hodges, female    DOB: 12-15-1980, 36 y.o.   MRN: [id number]  HPI 36 yo S AA P0 here for follow up of her OCPs and CPP. She OCPs have helped the DUB and maybe a little of the pelvic pain but she is hurting "a lot".   Review of Systems     Objective:   Physical Exam [id number] Breathing, conversing, and ambulating normally Ab- benign       Assessment & Plan:  CPP- offered IUD but she is not a fan of this due to the risk of irregular bleeding Other option would be to just continue the OCPs versus dx laparoscopy. She opts for the laparoscopy.

## 2016-06-07 ENCOUNTER — Telehealth: Payer: Self-pay

## 2016-06-07 NOTE — Telephone Encounter (Signed)
Pt would like to have order for chest xray put in. Would like to confirm TB is inactive. Please let pt know when this has been ordered.  Ottis Stain, CMA

## 2016-06-07 NOTE — Telephone Encounter (Signed)
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 She had to have a chest x-ray done to show that she was not infected. She said that isn't allowed to get anymore TB test done and that she has to have a chest x-ray. 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 jw

## 2016-06-08 ENCOUNTER — Emergency Department (HOSPITAL_COMMUNITY)
Admission: EM | Admit: 2016-06-08 | Discharge: 2016-06-08 | Disposition: A | Discharge status: Home / Self Care | Payer: Self-pay | Attending: Emergency Medicine | Admitting: Emergency Medicine

## 2016-06-08 ENCOUNTER — Encounter (HOSPITAL_COMMUNITY): Payer: Self-pay

## 2016-06-08 DIAGNOSIS — Z79899 Other long term (current) drug therapy: Secondary | ICD-10-CM | POA: Insufficient documentation

## 2016-06-08 DIAGNOSIS — R102 Pelvic and perineal pain: Secondary | ICD-10-CM | POA: Insufficient documentation

## 2016-06-08 DIAGNOSIS — G8929 Other chronic pain: Secondary | ICD-10-CM

## 2016-06-08 LAB — URINALYSIS, ROUTINE W REFLEX MICROSCOPIC
Bilirubin Urine: NEGATIVE
Glucose, UA: NEGATIVE mg/dL
Hgb urine dipstick: NEGATIVE
Ketones, ur: 20 mg/dL — AB
LEUKOCYTES UA: NEGATIVE
NITRITE: NEGATIVE
PH: 5 (ref 5.0–8.0)
Protein, ur: NEGATIVE mg/dL
SPECIFIC GRAVITY, URINE: 1.015 (ref 1.005–1.030)

## 2016-06-08 LAB — HCG, QUANTITATIVE, PREGNANCY: hCG, Beta Chain, Quant, S: 1 m[IU]/mL (ref <5)

## 2016-06-08 LAB — I-STAT BETA HCG BLOOD, ED (MC, WL, AP ONLY): I-stat hCG, quantitative: 6.6 m[IU]/mL — ABNORMAL HIGH (ref <5)

## 2016-06-08 LAB — COMPREHENSIVE METABOLIC PANEL
ALBUMIN: 3.4 g/dL — AB (ref 3.5–5.0)
ALK PHOS: 63 U/L (ref 38–126)
ALT: 27 U/L (ref 14–54)
ANION GAP: 10 (ref 5–15)
AST: 30 U/L (ref 15–41)
BILIRUBIN TOTAL: 0.9 mg/dL (ref 0.3–1.2)
BUN: 8 mg/dL (ref 6–20)
CALCIUM: 8.6 mg/dL — AB (ref 8.9–10.3)
CO2: 23 mmol/L (ref 22–32)
Chloride: 102 mmol/L (ref 101–111)
Creatinine, Ser: 0.68 mg/dL (ref 0.44–1.00)
GFR calc Af Amer: >60 mL/min (ref >60)
GFR calc non Af Amer: >60 mL/min (ref >60)
GLUCOSE: 95 mg/dL (ref 65–99)
POTASSIUM: 3.3 mmol/L — AB (ref 3.5–5.1)
SODIUM: 135 mmol/L (ref 135–145)
TOTAL PROTEIN: 7.1 g/dL (ref 6.5–8.1)

## 2016-06-08 LAB — WET PREP, GENITAL
Clue Cells Wet Prep HPF POC: NONE SEEN
Sperm: NONE SEEN
Trich, Wet Prep: NONE SEEN
YEAST WET PREP: NONE SEEN

## 2016-06-08 LAB — CBC
HCT: 38.6 % (ref 36.0–46.0)
Hemoglobin: 12.9 g/dL (ref 12.0–15.0)
MCH: 29.7 pg (ref 26.0–34.0)
MCHC: 33.4 g/dL (ref 30.0–36.0)
MCV: 88.7 fL (ref 78.0–100.0)
Platelets: 392 10*3/uL (ref 150–400)
RBC: 4.35 MIL/uL (ref 3.87–5.11)
RDW: 14.5 % (ref 11.5–15.5)
WBC: 9.8 10*3/uL (ref 4.0–10.5)

## 2016-06-08 LAB — LIPASE, BLOOD: Lipase: 17 U/L (ref 11–51)

## 2016-06-08 LAB — RAPID HIV SCREEN (HIV 1/2 AB+AG)
HIV 1/2 ANTIBODIES: NONREACTIVE
HIV-1 P24 ANTIGEN - HIV24: NONREACTIVE

## 2016-06-08 MED ORDER — CEFTRIAXONE SODIUM 250 MG IJ SOLR: 250.0000 mg | Freq: Once | INTRAMUSCULAR | Status: DC

## 2016-06-08 MED ORDER — CEFTRIAXONE SODIUM 250 MG IJ SOLR
250.0000 mg | Freq: Once | INTRAMUSCULAR | Status: AC
  Administered 2016-06-08: 250 mg via INTRAMUSCULAR
  Filled 2016-06-08: qty 250

## 2016-06-08 MED ORDER — ACETAMINOPHEN 500 MG PO TABS
1000.0000 mg | ORAL_TABLET | Freq: Once | ORAL | Status: AC
  Administered 2016-06-08: 1000 mg via ORAL
  Filled 2016-06-08: qty 2

## 2016-06-08 MED ORDER — MORPHINE SULFATE (PF) 2 MG/ML IV SOLN
4.0000 mg | Freq: Once | INTRAVENOUS | Status: AC
  Administered 2016-06-08: 4 mg via INTRAVENOUS
  Filled 2016-06-08: qty 2

## 2016-06-08 MED ORDER — AZITHROMYCIN 1 G PO PACK: 1.0000 g | PACK | Freq: Once | ORAL | Status: DC

## 2016-06-08 MED ORDER — SODIUM CHLORIDE 0.9 % IV BOLUS (SEPSIS)
1000.0000 mL | Freq: Once | INTRAVENOUS | Status: AC
  Administered 2016-06-08: 1000 mL via INTRAVENOUS

## 2016-06-08 MED ORDER — AZITHROMYCIN 1 G PO PACK
1.0000 g | PACK | Freq: Once | ORAL | Status: AC
  Administered 2016-06-08: 1 g via ORAL
  Filled 2016-06-08: qty 1

## 2016-06-08 MED ORDER — LIDOCAINE HCL 1 % IJ SOLN
INTRAMUSCULAR | Status: AC
  Administered 2016-06-08: 2.1 mL
  Filled 2016-06-08: qty 20

## 2016-06-08 NOTE — ED Triage Notes (Addendum)
Per pt, abdominal pain with n/v since 6 pm yesterday.  Pt states no fever.  No vaginal discharge or urinary symptoms.  Pt in middle left quadrant

## 2016-06-08 NOTE — ED Provider Notes (Signed)
[place] [facility] Provider Note   CSN: [id number] Arrival date & time: 06/08/16  5573     History   Chief Complaint Chief Complaint  Patient presents with  . Abdominal Pain  . Emesis    HPI Carla Hodges is a 36 y.o. female who presents with abdominal pain.   She reports that last night she began experiencing lower midline abdominal pain. Her pain is associated with nausea and she vomited multiple times last night. She denies fevers at home, states that she is getting occasional chills. She is sexually active with female partners, does not use condoms, takes OCPs. She denies abnormal/foul-smelling vaginal discharge, increased urinary frequency, or burning/painful urination. She has never had an abdominal/pelvic surgery, but is scheduled to have a exploratory laparoscopy on Wednesday to evaluate chronic pelvic pain and fibroids.  She says that this pain feels different, it is worse than her normal pain.  No constipation/diarrhea, last BM was yesterday and normal for her.  She also reports that her head hurts over her forehead and temples.  Denies tick bites.  No visual changes, blurry vision, double vision.  No known sick contacts, no rashes, no neck stiffness.   HPI  Past Medical History:  Diagnosis Date  . Bronchitis, chronic ([place])   . Fibroids     Patient Active Problem List   Diagnosis Date Noted  . Vaginal odor 01/23/2016  . 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 Breast nodule 11/06/2015  . Trouble in sleeping 09/30/2015  . Lower abdominal pain 08/09/2015  . Vaginal discharge 05/26/2015  . Vulvar abscess 04/28/2015  . Yeast vaginitis 12/14/2014  . Healthcare maintenance 09/19/2014  . Heavy menses 09/19/2014  . Wheezing 09/30/2013  . Pelvic pain 06/04/2013  . BV (bacterial vaginosis) 04/14/2012  . Fibroid 11/26/2011  . Depression 07/28/2006  . GERD 07/28/2006  . OBESITY, HX OF 07/28/2006    Past Surgical History:  Procedure Laterality  Date  . HYSTEROSCOPY     2000    OB History    Gravida Para Term Preterm AB Living   0             SAB TAB Ectopic Multiple Live Births                   Home Medications    Prior to Admission medications   Medication Sig Start Date End Date Taking? Authorizing Provider  ibuprofen (ADVIL,MOTRIN) 800 MG tablet TAKE ONE TABLET BY MOUTH EVERY 8 HOURS AS NEEDED 04/24/16  Yes Dove, Myra C, MD  Multiple Vitamins-Minerals (WOMENS MULTI PO) Take 1 tablet by mouth daily.   Yes [provider]  norgestrel-ethinyl estradiol (LO/OVRAL,CRYSELLE) 0.3-30 MG-MCG tablet Take 1 tablet by mouth daily. 01/18/16  Yes Dove, Myra C, MD  phentermine 37.5 MG capsule Take 37.5 mg by mouth every morning.   Yes [provider]  Probiotic Product (PROBIOTIC FORMULA PO) Take 30 mLs by mouth daily.   Yes [provider]    Family History Family History  Problem Relation Age of Onset  . Diabetes Maternal Uncle   . Diabetes Maternal Uncle   . Alcohol abuse Neg Hx   . Arthritis Neg Hx   . Asthma Neg Hx   . Birth defects Neg Hx   . Cancer Neg Hx   . COPD Neg Hx   . Depression Neg Hx   . Drug abuse Neg Hx   . Early death Neg Hx   . Hearing loss  Neg Hx   . Heart disease Neg Hx   . Hyperlipidemia Neg Hx   . Hypertension Neg Hx   . Kidney disease Neg Hx   . Learning disabilities Neg Hx   . Mental illness Neg Hx   . Mental retardation Neg Hx   . Miscarriages / Stillbirths Neg Hx   . Stroke Neg Hx   . Vision loss Neg Hx     Social History Social History  Substance Use Topics  . Smoking status: Former Smoker    Quit date: 04/12/2011  . Smokeless tobacco: Never Used  . Alcohol use No     Allergies   Latex   Review of Systems Review of Systems  Constitutional: Positive for chills. Negative for diaphoresis, fatigue and fever.  HENT: Negative for ear pain and sore throat.   Eyes: Negative for pain and visual disturbance.  Respiratory: Negative for cough and shortness  of breath.   Cardiovascular: Negative for chest pain and palpitations.  Gastrointestinal: Positive for abdominal pain, nausea and vomiting. Negative for blood in stool, constipation and diarrhea.  Genitourinary: Negative for decreased urine volume, dysuria, frequency, hematuria, menstrual problem, pelvic pain, vaginal bleeding, vaginal discharge and vaginal pain.  Musculoskeletal: Negative for arthralgias and back pain.  Skin: Negative for color change and rash.  Neurological: Positive for headaches. Negative for seizures and syncope.  All other systems reviewed and are negative.    Physical Exam Updated Vital Signs BP 113/62 (BP Location: Left Arm)   Pulse 99   Temp 99.5 F (37.5 C) (Oral)   Resp 14   LMP 05/23/2016   SpO2 99%   Physical Exam  Constitutional: She appears well-developed and well-nourished.  HENT:  Head: Normocephalic and atraumatic.  Eyes: Conjunctivae are normal. No scleral icterus.  Neck: Normal range of motion. Neck supple.  Cardiovascular: Normal rate, regular rhythm and intact distal pulses.   No murmur heard. Pulmonary/Chest: Effort normal and breath sounds normal. No stridor. No respiratory distress. She has no wheezes.  Abdominal: Soft. Bowel sounds are normal. She exhibits no distension and no mass. There is no hepatosplenomegaly. There is tenderness. There is no rebound, no guarding, no tenderness at McBurney's point and negative Murphy's sign.  Abdomen is obese, pain is localized to suprapubic and radiates to left lower quadrant.  Exam limited by body habitus.  No guarding or masses felt.   Genitourinary: Vagina normal. Pelvic exam was performed with patient prone. There is no rash or lesion on the right labia. There is no rash or lesion on the left labia. Uterus is enlarged. Uterus is not tender. Right adnexum displays no mass, no tenderness and no fullness. Left adnexum displays no mass, no tenderness and no fullness. No erythema, tenderness or bleeding  in the vagina. No signs of injury around the vagina. No vaginal discharge found.  Musculoskeletal: She exhibits no edema, tenderness or deformity.  Neurological: She is alert. No sensory deficit. She exhibits normal muscle tone.  Skin: Skin is warm and dry. She is not diaphoretic.  Psychiatric: She has a normal mood and affect. Her behavior is normal.  Nursing note and vitals reviewed.    ED Treatments / Results  Labs (all labs ordered are listed, but only abnormal results are displayed) Labs Reviewed  WET PREP, GENITAL - Abnormal; Notable for the following:       Result Value   WBC, Wet Prep HPF POC RARE (*)    All other components within normal limits  COMPREHENSIVE METABOLIC PANEL -  Abnormal; Notable for the following:    Potassium 3.3 (*)    Calcium 8.6 (*)    Albumin 3.4 (*)    All other components within normal limits  URINALYSIS, ROUTINE W REFLEX MICROSCOPIC - Abnormal; Notable for the following:    Ketones, ur 20 (*)    All other components within normal limits  I-STAT BETA HCG BLOOD, ED ([facility], [facility], [facility] ONLY) - Abnormal; Notable for the following:    I-stat hCG, quantitative 6.6 (*)    All other components within normal limits  LIPASE, BLOOD  CBC  HIV ANTIBODY (ROUTINE TESTING)  RAPID HIV SCREEN (HIV 1/2 AB+AG)  HCG, QUANTITATIVE, PREGNANCY  GC/CHLAMYDIA PROBE AMP ([place]) NOT AT [facility]    EKG  EKG Interpretation None       Radiology No results found.  Procedures Procedures (including critical care time)  Medications Ordered in ED Medications  sodium chloride 0.9 % bolus 1,000 mL (0 mLs Intravenous Stopped 06/08/16 1350)  morphine 2 MG/ML injection 4 mg (4 mg Intravenous Given 06/08/16 1141)  acetaminophen (TYLENOL) tablet 1,000 mg (1,000 mg Oral Given 06/08/16 1351)  cefTRIAXone (ROCEPHIN) injection 250 mg (250 mg Intramuscular Given 06/08/16 1525)  azithromycin (ZITHROMAX) powder 1 g (1 g Oral Given 06/08/16 1526)  lidocaine (XYLOCAINE) 1 % (with pres) injection  (2.1 mLs  Given 06/08/16 1526)     Initial Impression / Assessment and Plan / ED Course  I have reviewed the triage vital signs and the nursing notes.  Pertinent labs & imaging results that were available during my care of the patient were reviewed by me and considered in my medical decision making (see chart for details).  Clinical Course as of Jun 09 1924  Sat Jun 08, 2016  1105 Spoke with lab about pending CBC.  They will run CBC, said it has not been running.   [EH]  66 RN stated patient changed her minds and wants pain medications knowing that if she is pregnant it could cause serious harm, death or birth defects/disability to the fetus.  Will order.   [EH]  1432 Saw that beta HCG in lab was cancelled, this provider was not notified, will re-order. On repeat exam patient's abdomen is markedly less tender.  [EH]    Clinical Course User Index [EH] Lorin Glass, PA-C    Patient is nontoxic, nonseptic appearing, in no apparent distress.  Patient's pain and other symptoms adequately managed in emergency department.  Patient reports that her current pain is the same as her chronic pelvic pain (in location, character, and general feel) just greatly increased in severity. Fluid bolus given.  Labs and vitals reviewed.  Imaging was considered but due to patient's long-standing history of pelvic pain which is unchanged in all aspects except severity I do not feel like it would be beneficial at this time. Patient does not meet the SIRS or Sepsis criteria.  On repeat exam patient does not have a surgical abdomen and there are no peritoneal signs.  No indication of appendicitis, bowel obstruction, bowel perforation, cholecystitis, diverticulitis, PID or ectopic pregnancy.  Patient discharged home with symptomatic treatment and given strict instructions for follow-up with their primary care physician and OB/GYN. Patient was instructed to follow-up with her OB/GYN on Monday in case she needs repeat  evaluation before her scheduled laparoscopy on Wednesday. I have also discussed reasons to return immediately to the ER.  Patient expresses understanding and agrees with plan.    Final Clinical Impressions(s) / ED Diagnoses  Final diagnoses:  Chronic pelvic pain in female  Pelvic pain    New Prescriptions Discharge Medication List as of 06/08/2016  3:15 PM       Lorin Glass, PA-C 06/09/16 1926    Little, Wenda Overland, MD 06/10/16 1459

## 2016-06-08 NOTE — Discharge Instructions (Signed)
YOUR BLOOD WORK SHOWS THAT YOU ARE NOT PREGNANT!!  Today you have been treated for gonorrhea and chlamydia.  The test to determine if you have these will take a few days. They will only call you if your tests come back positive, no news is good news. In the result that your tests are positive you have already been treated.  Today your pain appears consistent with your chronic pelvic pain.  Please call your OB/GYN Monday for evaluation.  If your pain worsens, if you develop any new or concerning symptoms please seek additional medical care.    Please take Ibuprofen (Advil, motrin) and Tylenol (acetaminophen) to relieve your pain.  You may take up to 800 MG (4 pills) of normal strength ibuprofen every 8 hours as needed.  In between doses of ibuprofen you make take tylenol, up to 1,000 mg (two extra strength pills).  Do not take more than 3,000 mg tylenol in a 24 hour period.  Please check all medication labels as many medications such as pain and cold medications may contain tylenol.  Do not drink while taking these medications.  Do not take other NSAID'S while taking ibuprofen (such as aleve or naproxen).

## 2016-06-09 LAB — HIV ANTIBODY (ROUTINE TESTING W REFLEX): HIV Screen 4th Generation wRfx: NONREACTIVE

## 2016-06-10 LAB — GC/CHLAMYDIA PROBE AMP (~~LOC~~) NOT AT ARMC
Chlamydia: NEGATIVE
Neisseria Gonorrhea: NEGATIVE

## 2016-06-11 ENCOUNTER — Encounter (HOSPITAL_COMMUNITY): Payer: Self-pay

## 2016-06-11 ENCOUNTER — Encounter (HOSPITAL_COMMUNITY)
Admission: RE | Admit: 2016-06-11 | Discharge: 2016-06-11 | Disposition: A | Discharge status: Home / Self Care | Payer: Self-pay | Source: Ambulatory Visit | Attending: Obstetrics & Gynecology | Admitting: Obstetrics & Gynecology

## 2016-06-11 DIAGNOSIS — Z01812 Encounter for preprocedural laboratory examination: Secondary | ICD-10-CM | POA: Insufficient documentation

## 2016-06-11 HISTORY — DX: Major depressive disorder, single episode, unspecified: F32.9

## 2016-06-11 HISTORY — DX: Respiratory tuberculosis unspecified: A15.9

## 2016-06-11 HISTORY — DX: Depression, unspecified: F32.A

## 2016-06-11 NOTE — Patient Instructions (Addendum)
Your procedure is scheduled on:  Tomorrow, June 12, 2016  Enter through the [employer] [facility] at:  8:00 AM  Pick up the phone at the desk and dial [phone].  Call this number if you have problems the morning of surgery: [phone].  Remember: Do NOT eat food or drink after:  Midnight Tonight  Take these medicines the morning of surgery with a SIP OF WATER:  None  Stop ALL herbal medications at this time  Do NOT smoke the day of surgery.  Do NOT wear jewelry (body piercing), metal hair clips/bobby pins, make-up, or nail polish. Do NOT wear lotions, powders, or perfumes.  You may wear deodorant. Do NOT shave for 48 hours prior to surgery. Do NOT bring valuables to the hospital. Contacts, dentures, or bridgework may not be worn into surgery.  Have a responsible adult drive you home and stay with you for 24 hours after your procedure  Bring a copy of your healthcare power of attorney and living will documents.

## 2016-06-11 NOTE — Telephone Encounter (Signed)
Patient calls again.

## 2016-06-12 ENCOUNTER — Observation Stay (HOSPITAL_COMMUNITY)
Admission: RE | Admit: 2016-06-12 | Discharge: 2016-06-13 | Disposition: A | Discharge status: Home / Self Care | Payer: Self-pay | Source: Ambulatory Visit | Attending: Obstetrics & Gynecology | Admitting: Obstetrics & Gynecology

## 2016-06-12 ENCOUNTER — Encounter (HOSPITAL_COMMUNITY)
Admission: RE | Disposition: A | Discharge status: Home / Self Care | Payer: Self-pay | Source: Ambulatory Visit | Attending: Obstetrics & Gynecology

## 2016-06-12 ENCOUNTER — Ambulatory Visit (HOSPITAL_COMMUNITY): Payer: Self-pay | Admitting: Anesthesiology

## 2016-06-12 ENCOUNTER — Encounter (HOSPITAL_COMMUNITY): Payer: Self-pay

## 2016-06-12 DIAGNOSIS — N801 Endometriosis of ovary: Secondary | ICD-10-CM

## 2016-06-12 DIAGNOSIS — Z6841 Body Mass Index (BMI) 40.0 and over, adult: Secondary | ICD-10-CM | POA: Insufficient documentation

## 2016-06-12 DIAGNOSIS — R102 Pelvic and perineal pain: Principal | ICD-10-CM | POA: Insufficient documentation

## 2016-06-12 DIAGNOSIS — D259 Leiomyoma of uterus, unspecified: Secondary | ICD-10-CM | POA: Insufficient documentation

## 2016-06-12 DIAGNOSIS — Z87891 Personal history of nicotine dependence: Secondary | ICD-10-CM | POA: Insufficient documentation

## 2016-06-12 DIAGNOSIS — Z9889 Other specified postprocedural states: Secondary | ICD-10-CM

## 2016-06-12 DIAGNOSIS — Z79899 Other long term (current) drug therapy: Secondary | ICD-10-CM | POA: Insufficient documentation

## 2016-06-12 DIAGNOSIS — N99840 Postprocedural hematoma of a genitourinary system organ or structure following a genitourinary system procedure: Secondary | ICD-10-CM | POA: Insufficient documentation

## 2016-06-12 DIAGNOSIS — G8929 Other chronic pain: Secondary | ICD-10-CM | POA: Insufficient documentation

## 2016-06-12 DIAGNOSIS — D649 Anemia, unspecified: Secondary | ICD-10-CM | POA: Insufficient documentation

## 2016-06-12 DIAGNOSIS — Z793 Long term (current) use of hormonal contraceptives: Secondary | ICD-10-CM | POA: Insufficient documentation

## 2016-06-12 DIAGNOSIS — N803 Endometriosis of pelvic peritoneum: Secondary | ICD-10-CM | POA: Insufficient documentation

## 2016-06-12 HISTORY — PX: LAPAROSCOPY: SHX197

## 2016-06-12 LAB — CBC
HEMATOCRIT: 27.3 % — AB (ref 36.0–46.0)
Hemoglobin: 9.1 g/dL — ABNORMAL LOW (ref 12.0–15.0)
MCH: 29.7 pg (ref 26.0–34.0)
MCHC: 33.3 g/dL (ref 30.0–36.0)
MCV: 89.2 fL (ref 78.0–100.0)
PLATELETS: 334 10*3/uL (ref 150–400)
RBC: 3.06 MIL/uL — ABNORMAL LOW (ref 3.87–5.11)
RDW: 14.5 % (ref 11.5–15.5)
WBC: 13.9 10*3/uL — ABNORMAL HIGH (ref 4.0–10.5)

## 2016-06-12 LAB — ABO/RH: ABO/RH(D): O POS

## 2016-06-12 LAB — PREGNANCY, URINE: Preg Test, Ur: NEGATIVE

## 2016-06-12 SURGERY — LAPAROSCOPY, DIAGNOSTIC
Anesthesia: General | Site: Abdomen

## 2016-06-12 MED ORDER — PHENYLEPHRINE HCL 10 MG/ML IJ SOLN
INTRAMUSCULAR | Status: DC | PRN
  Administered 2016-06-12 (×9): 80 ug via INTRAVENOUS

## 2016-06-12 MED ORDER — PROMETHAZINE HCL 25 MG/ML IJ SOLN: 6.2500 mg | INTRAMUSCULAR | Status: DC | PRN

## 2016-06-12 MED ORDER — LACTATED RINGERS IV SOLN
INTRAVENOUS | Status: DC
  Administered 2016-06-12: 1000 mL via INTRAVENOUS
  Administered 2016-06-12 (×2): via INTRAVENOUS
  Administered 2016-06-12: 125 mL/h via INTRAVENOUS

## 2016-06-12 MED ORDER — FENTANYL CITRATE (PF) 100 MCG/2ML IJ SOLN
INTRAMUSCULAR | Status: DC | PRN
  Administered 2016-06-12 (×3): 50 ug via INTRAVENOUS

## 2016-06-12 MED ORDER — KETOROLAC TROMETHAMINE 30 MG/ML IJ SOLN
INTRAMUSCULAR | Status: DC | PRN
  Administered 2016-06-12: 30 mg via INTRAVENOUS

## 2016-06-12 MED ORDER — MEPERIDINE HCL 25 MG/ML IJ SOLN: 6.2500 mg | INTRAMUSCULAR | Status: DC | PRN

## 2016-06-12 MED ORDER — HYDROMORPHONE HCL 1 MG/ML IJ SOLN
INTRAMUSCULAR | Status: AC
  Filled 2016-06-12: qty 1

## 2016-06-12 MED ORDER — LIDOCAINE HCL (CARDIAC) 20 MG/ML IV SOLN
INTRAVENOUS | Status: DC | PRN
  Administered 2016-06-12: 30 mg via INTRAVENOUS

## 2016-06-12 MED ORDER — ROCURONIUM BROMIDE 100 MG/10ML IV SOLN
INTRAVENOUS | Status: DC | PRN
  Administered 2016-06-12: 50 mg via INTRAVENOUS

## 2016-06-12 MED ORDER — ONDANSETRON HCL 4 MG/2ML IJ SOLN
INTRAMUSCULAR | Status: DC | PRN
  Administered 2016-06-12: 4 mg via INTRAVENOUS

## 2016-06-12 MED ORDER — ONDANSETRON HCL 4 MG/2ML IJ SOLN
INTRAMUSCULAR | Status: AC
  Filled 2016-06-12: qty 2

## 2016-06-12 MED ORDER — LACTATED RINGERS IR SOLN
Status: DC | PRN
  Administered 2016-06-12: 3000 mL

## 2016-06-12 MED ORDER — IBUPROFEN 600 MG PO TABS: 600.0000 mg | ORAL_TABLET | Freq: Four times a day (QID) | ORAL | 1 refills | Status: DC | PRN

## 2016-06-12 MED ORDER — SUGAMMADEX SODIUM 200 MG/2ML IV SOLN
INTRAVENOUS | Status: DC | PRN
  Administered 2016-06-12: 222.6 mg via INTRAVENOUS

## 2016-06-12 MED ORDER — DEXTROSE-NACL 5-0.9 % IV SOLN
INTRAVENOUS | Status: DC
  Administered 2016-06-13: 01:00:00 via INTRAVENOUS

## 2016-06-12 MED ORDER — HYDROMORPHONE HCL 1 MG/ML IJ SOLN
0.2000 mg | INTRAMUSCULAR | Status: DC | PRN
  Administered 2016-06-12 – 2016-06-13 (×5): 0.6 mg via INTRAVENOUS
  Filled 2016-06-12 (×5): qty 1

## 2016-06-12 MED ORDER — SUGAMMADEX SODIUM 200 MG/2ML IV SOLN
INTRAVENOUS | Status: DC | PRN
  Administered 2016-06-12: 200 mg via INTRAVENOUS

## 2016-06-12 MED ORDER — HYDROMORPHONE HCL 1 MG/ML IJ SOLN: 0.2500 mg | INTRAMUSCULAR | Status: DC | PRN

## 2016-06-12 MED ORDER — ROCURONIUM BROMIDE 100 MG/10ML IV SOLN
INTRAVENOUS | Status: AC
  Filled 2016-06-12: qty 1

## 2016-06-12 MED ORDER — AMMONIA AROMATIC IN INHA
RESPIRATORY_TRACT | Status: AC
  Filled 2016-06-12: qty 10

## 2016-06-12 MED ORDER — KETOROLAC TROMETHAMINE 30 MG/ML IJ SOLN
INTRAMUSCULAR | Status: AC
  Filled 2016-06-12: qty 1

## 2016-06-12 MED ORDER — MIDAZOLAM HCL 2 MG/2ML IJ SOLN
INTRAMUSCULAR | Status: DC | PRN
  Administered 2016-06-12: 1 mg via INTRAVENOUS

## 2016-06-12 MED ORDER — MIDAZOLAM HCL 2 MG/2ML IJ SOLN
INTRAMUSCULAR | Status: AC
Start: 2016-06-12 — End: 2016-06-12
  Filled 2016-06-12: qty 2

## 2016-06-12 MED ORDER — FENTANYL CITRATE (PF) 250 MCG/5ML IJ SOLN
INTRAMUSCULAR | Status: AC
  Filled 2016-06-12: qty 5

## 2016-06-12 MED ORDER — ONDANSETRON HCL 4 MG/2ML IJ SOLN
4.0000 mg | Freq: Four times a day (QID) | INTRAMUSCULAR | Status: DC | PRN
Start: 2016-06-12 — End: 2016-06-13

## 2016-06-12 MED ORDER — PROPOFOL 10 MG/ML IV BOLUS
INTRAVENOUS | Status: DC | PRN
  Administered 2016-06-12: 200 mg via INTRAVENOUS

## 2016-06-12 MED ORDER — PROPOFOL 10 MG/ML IV BOLUS
INTRAVENOUS | Status: AC
  Filled 2016-06-12: qty 20

## 2016-06-12 MED ORDER — SCOPOLAMINE 1 MG/3DAYS TD PT72
1.0000 | MEDICATED_PATCH | Freq: Once | TRANSDERMAL | Status: DC
  Administered 2016-06-12: 1.5 mg via TRANSDERMAL

## 2016-06-12 MED ORDER — HYDROMORPHONE HCL 1 MG/ML IJ SOLN
0.2500 mg | INTRAMUSCULAR | Status: AC | PRN
  Administered 2016-06-12 (×5): 0.5 mg via INTRAVENOUS
  Administered 2016-06-12: 0.25 mg via INTRAVENOUS
  Administered 2016-06-12: 0.5 mg via INTRAVENOUS
  Administered 2016-06-12: 0.25 mg via INTRAVENOUS

## 2016-06-12 MED ORDER — MIDAZOLAM HCL 2 MG/2ML IJ SOLN: 0.5000 mg | Freq: Once | INTRAMUSCULAR | Status: DC | PRN

## 2016-06-12 MED ORDER — LIDOCAINE HCL (CARDIAC) 20 MG/ML IV SOLN
INTRAVENOUS | Status: AC
  Filled 2016-06-12: qty 5

## 2016-06-12 MED ORDER — SCOPOLAMINE 1 MG/3DAYS TD PT72
MEDICATED_PATCH | TRANSDERMAL | Status: AC
  Administered 2016-06-12: 1.5 mg via TRANSDERMAL
  Filled 2016-06-12: qty 1

## 2016-06-12 MED ORDER — DEXAMETHASONE SODIUM PHOSPHATE 4 MG/ML IJ SOLN
INTRAMUSCULAR | Status: AC
  Filled 2016-06-12: qty 1

## 2016-06-12 MED ORDER — CEFAZOLIN SODIUM-DEXTROSE 2-3 GM-% IV SOLR
INTRAVENOUS | Status: DC | PRN
  Administered 2016-06-12: 2 g via INTRAVENOUS

## 2016-06-12 MED ORDER — DEXAMETHASONE SODIUM PHOSPHATE 10 MG/ML IJ SOLN
INTRAMUSCULAR | Status: DC | PRN
Start: 2016-06-12 — End: 2016-06-12
  Administered 2016-06-12: 4 mg via INTRAVENOUS

## 2016-06-12 MED ORDER — MIDAZOLAM HCL 2 MG/2ML IJ SOLN
INTRAMUSCULAR | Status: AC
  Filled 2016-06-12: qty 2

## 2016-06-12 MED ORDER — ACETAMINOPHEN 10 MG/ML IV SOLN
INTRAVENOUS | Status: AC
  Filled 2016-06-12: qty 100

## 2016-06-12 MED ORDER — SUCCINYLCHOLINE CHLORIDE 20 MG/ML IJ SOLN
INTRAMUSCULAR | Status: DC | PRN
  Administered 2016-06-12: 120 mg via INTRAVENOUS

## 2016-06-12 MED ORDER — SUCCINYLCHOLINE CHLORIDE 200 MG/10ML IV SOSY
PREFILLED_SYRINGE | INTRAVENOUS | Status: AC
  Filled 2016-06-12: qty 10

## 2016-06-12 MED ORDER — OXYCODONE-ACETAMINOPHEN 5-325 MG PO TABS: 1.0000 | ORAL_TABLET | Freq: Four times a day (QID) | ORAL | 0 refills | Status: DC | PRN

## 2016-06-12 MED ORDER — FENTANYL CITRATE (PF) 100 MCG/2ML IJ SOLN
INTRAMUSCULAR | Status: DC | PRN
  Administered 2016-06-12: 50 ug via INTRAVENOUS

## 2016-06-12 MED ORDER — IBUPROFEN 800 MG PO TABS: 800.0000 mg | ORAL_TABLET | Freq: Three times a day (TID) | ORAL | Status: DC | PRN

## 2016-06-12 MED ORDER — ROCURONIUM BROMIDE 100 MG/10ML IV SOLN
INTRAVENOUS | Status: DC | PRN
  Administered 2016-06-12: 20 mg via INTRAVENOUS

## 2016-06-12 MED ORDER — PROPOFOL 10 MG/ML IV BOLUS
INTRAVENOUS | Status: DC | PRN
  Administered 2016-06-12: 150 mg via INTRAVENOUS

## 2016-06-12 MED ORDER — OXYCODONE-ACETAMINOPHEN 5-325 MG PO TABS
1.0000 | ORAL_TABLET | ORAL | Status: DC | PRN
Start: 2016-06-12 — End: 2016-06-13
  Administered 2016-06-13 (×2): 2 via ORAL
  Filled 2016-06-12 (×2): qty 2

## 2016-06-12 MED ORDER — ACETAMINOPHEN 10 MG/ML IV SOLN
1000.0000 mg | Freq: Once | INTRAVENOUS | Status: AC
  Administered 2016-06-12: 1000 mg via INTRAVENOUS

## 2016-06-12 MED ORDER — HYDROMORPHONE HCL 1 MG/ML IJ SOLN
INTRAMUSCULAR | Status: AC
Start: 2016-06-12 — End: 2016-06-13
  Filled 2016-06-12: qty 1

## 2016-06-12 MED ORDER — LIDOCAINE HCL (CARDIAC) 20 MG/ML IV SOLN
INTRAVENOUS | Status: DC | PRN
  Administered 2016-06-12: 50 mg via INTRAVENOUS

## 2016-06-12 MED ORDER — ONDANSETRON HCL 4 MG PO TABS: 4.0000 mg | ORAL_TABLET | Freq: Four times a day (QID) | ORAL | Status: DC | PRN

## 2016-06-12 MED ORDER — BUPIVACAINE HCL (PF) 0.5 % IJ SOLN
INTRAMUSCULAR | Status: DC | PRN
  Administered 2016-06-12: 7 mL

## 2016-06-12 MED ORDER — SUGAMMADEX SODIUM 200 MG/2ML IV SOLN
INTRAVENOUS | Status: AC
  Filled 2016-06-12: qty 2

## 2016-06-12 MED ORDER — PHENYLEPHRINE 40 MCG/ML (10ML) SYRINGE FOR IV PUSH (FOR BLOOD PRESSURE SUPPORT)
PREFILLED_SYRINGE | INTRAVENOUS | Status: AC
  Filled 2016-06-12: qty 20

## 2016-06-12 SURGICAL SUPPLY — 36 items
APPLICATOR COTTON TIP 6IN STRL (MISCELLANEOUS) ×1 IMPLANT
BAG SPEC RTRVL LRG 6X4 10 (ENDOMECHANICALS)
CABLE HIGH FREQUENCY MONO STRZ (ELECTRODE) IMPLANT
CLOSURE WOUND 1/2 X4 (GAUZE/BANDAGES/DRESSINGS) ×1
CLOTH BEACON ORANGE TIMEOUT ST (SAFETY) ×3 IMPLANT
DRSG OPSITE POSTOP 3X4 (GAUZE/BANDAGES/DRESSINGS) ×2 IMPLANT
DURAPREP 26ML APPLICATOR (WOUND CARE) ×3 IMPLANT
ELECT REM PT RETURN 9FT ADLT (ELECTROSURGICAL) ×3
ELECTRODE REM PT RTRN 9FT ADLT (ELECTROSURGICAL) IMPLANT
GLOVE BIO SURGEON STRL SZ 6.5 (GLOVE) ×4 IMPLANT
GLOVE BIO SURGEONS STRL SZ 6.5 (GLOVE) ×2
GLOVE BIOGEL PI IND STRL 7.0 (GLOVE) ×2 IMPLANT
GLOVE BIOGEL PI INDICATOR 7.0 (GLOVE) ×4
GOWN STRL REUS W/TWL LRG LVL3 (GOWN DISPOSABLE) ×6 IMPLANT
NDL SAFETY ECLIPSE 18X1.5 (NEEDLE) ×1 IMPLANT
NEEDLE HYPO 18GX1.5 SHARP (NEEDLE)
NEEDLE INSUFFLATION 120MM (ENDOMECHANICALS) ×3 IMPLANT
NS IRRIG 1000ML POUR BTL (IV SOLUTION) ×1 IMPLANT
PACK LAPAROSCOPY BASIN (CUSTOM PROCEDURE TRAY) ×3 IMPLANT
PACK TRENDGUARD 450 HYBRID PRO (MISCELLANEOUS) IMPLANT
PACK TRENDGUARD 600 HYBRD PROC (MISCELLANEOUS) IMPLANT
POUCH SPECIMEN RETRIEVAL 10MM (ENDOMECHANICALS) IMPLANT
PROTECTOR NERVE ULNAR (MISCELLANEOUS) ×2 IMPLANT
SET IRRIG TUBING LAPAROSCOPIC (IRRIGATION / IRRIGATOR) ×2 IMPLANT
SHEARS HARMONIC ACE PLUS 36CM (ENDOMECHANICALS) IMPLANT
SLEEVE XCEL OPT CAN 5 100 (ENDOMECHANICALS) ×4 IMPLANT
STRIP CLOSURE SKIN 1/2X4 (GAUZE/BANDAGES/DRESSINGS) ×2 IMPLANT
SUT VICRYL 0 UR6 27IN ABS (SUTURE) IMPLANT
SUT VICRYL 4-0 PS2 18IN ABS (SUTURE) ×3 IMPLANT
TOWEL OR 17X24 6PK STRL BLUE (TOWEL DISPOSABLE) ×6 IMPLANT
TRAY FOLEY BAG SILVER LF 16FR (SET/KITS/TRAYS/PACK) ×4 IMPLANT
TRENDGUARD 450 HYBRID PRO PACK (MISCELLANEOUS) ×3
TRENDGUARD 600 HYBRID PROC PK (MISCELLANEOUS)
TROCAR OPTI TIP 5M 100M (ENDOMECHANICALS) ×3 IMPLANT
TROCAR XCEL NON-BLD 11X100MML (ENDOMECHANICALS) IMPLANT
WARMER LAPAROSCOPE (MISCELLANEOUS) ×3 IMPLANT

## 2016-06-12 SURGICAL SUPPLY — 37 items
APPLICATOR COTTON TIP 6IN STRL (MISCELLANEOUS) ×3 IMPLANT
BAG SPEC RTRVL LRG 6X4 10 (ENDOMECHANICALS)
CABLE HIGH FREQUENCY MONO STRZ (ELECTRODE) IMPLANT
CLOSURE WOUND 1/2 X4 (GAUZE/BANDAGES/DRESSINGS) ×1
CLOTH BEACON ORANGE TIMEOUT ST (SAFETY) ×3 IMPLANT
DRSG OPSITE POSTOP 3X4 (GAUZE/BANDAGES/DRESSINGS) IMPLANT
DRSG TELFA 3X8 NADH (GAUZE/BANDAGES/DRESSINGS) ×3 IMPLANT
DURAPREP 26ML APPLICATOR (WOUND CARE) ×3 IMPLANT
ELECT REM PT RETURN 9FT ADLT (ELECTROSURGICAL)
ELECTRODE REM PT RTRN 9FT ADLT (ELECTROSURGICAL) IMPLANT
GLOVE BIO SURGEON STRL SZ 6.5 (GLOVE) ×4 IMPLANT
GLOVE BIO SURGEONS STRL SZ 6.5 (GLOVE) ×2
GLOVE BIOGEL PI IND STRL 7.0 (GLOVE) ×2 IMPLANT
GLOVE BIOGEL PI INDICATOR 7.0 (GLOVE) ×4
GOWN STRL REUS W/TWL LRG LVL3 (GOWN DISPOSABLE) ×6 IMPLANT
NDL SAFETY ECLIPSE 18X1.5 (NEEDLE) ×1 IMPLANT
NEEDLE HYPO 18GX1.5 SHARP (NEEDLE) ×3
NEEDLE INSUFFLATION 120MM (ENDOMECHANICALS) ×3 IMPLANT
NS IRRIG 1000ML POUR BTL (IV SOLUTION) ×3 IMPLANT
PACK LAPAROSCOPY BASIN (CUSTOM PROCEDURE TRAY) ×3 IMPLANT
PACK TRENDGUARD 450 HYBRID PRO (MISCELLANEOUS) IMPLANT
PACK TRENDGUARD 600 HYBRD PROC (MISCELLANEOUS) IMPLANT
PAD DRESSING TELFA 3X8 NADH (GAUZE/BANDAGES/DRESSINGS) IMPLANT
POUCH SPECIMEN RETRIEVAL 10MM (ENDOMECHANICALS) IMPLANT
PROTECTOR NERVE ULNAR (MISCELLANEOUS) ×6 IMPLANT
SET IRRIG TUBING LAPAROSCOPIC (IRRIGATION / IRRIGATOR) IMPLANT
SHEARS HARMONIC ACE PLUS 36CM (ENDOMECHANICALS) IMPLANT
SLEEVE XCEL OPT CAN 5 100 (ENDOMECHANICALS) ×2 IMPLANT
STRIP CLOSURE SKIN 1/2X4 (GAUZE/BANDAGES/DRESSINGS) ×2 IMPLANT
SUT VICRYL 0 UR6 27IN ABS (SUTURE) IMPLANT
SUT VICRYL 4-0 PS2 18IN ABS (SUTURE) ×3 IMPLANT
TOWEL OR 17X24 6PK STRL BLUE (TOWEL DISPOSABLE) ×6 IMPLANT
TRENDGUARD 450 HYBRID PRO PACK (MISCELLANEOUS)
TRENDGUARD 600 HYBRID PROC PK (MISCELLANEOUS)
TROCAR OPTI TIP 5M 100M (ENDOMECHANICALS) ×3 IMPLANT
TROCAR XCEL NON-BLD 11X100MML (ENDOMECHANICALS) IMPLANT
WARMER LAPAROSCOPE (MISCELLANEOUS) ×3 IMPLANT

## 2016-06-12 NOTE — H&P (Signed)
Carla Hodges is an 36 y.o. female S AA P0 here for follow up of her OCPs and CPP. She OCPs have helped the DUB and maybe a little of the pelvic pain but she is hurting "a lot". She will be getting a diagnostic laparoscopy. Her pain has been there for 4 years, all month long but worse with her periods. She has been on OCPs for about 4 months. This has regulated her periods which last about 5 days. The OCPs did not help much with the pain. She takes oxycodone that she get from her grandmother. The IBU does not help.     Patient's last menstrual period was 05/23/2016.    Past Medical History:  Diagnosis Date  . Bronchitis, chronic ([place])   . Depression    moderate  . Fibroids   . 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 HYSTEROSCOPY     2000    Family History  Problem Relation Age of Onset  . Diabetes Maternal Uncle   . Diabetes Maternal Uncle   . Alcohol abuse Neg Hx   . Arthritis Neg Hx   . Asthma Neg Hx   . Birth defects Neg Hx   . Cancer Neg Hx   . COPD Neg Hx   . Depression Neg Hx   . Drug abuse Neg Hx   . Early death Neg Hx   . Hearing loss Neg Hx   . Heart disease Neg Hx   . Hyperlipidemia Neg Hx   . Hypertension Neg Hx   . Kidney disease Neg Hx   . Learning disabilities Neg Hx   . Mental illness Neg Hx   . Mental retardation Neg Hx   . Miscarriages / Stillbirths Neg Hx   . Stroke Neg Hx   . Vision loss Neg Hx     Social History:  reports that she quit smoking about 5 years ago. She has never used smokeless tobacco. She reports that she does not drink alcohol or use drugs.  Allergies:  Allergies  Allergen Reactions  . Latex Other (See Comments)    "break out down there" with latex condom use    Prescriptions Prior to Admission  Medication Sig Dispense Refill Last Dose  . CHORIOGONADOTROPIN ALFA [place] Inject 35 Units into the skin every Monday, Wednesday, and Friday.   Past Week at Unknown time  .  ibuprofen (ADVIL,MOTRIN) 800 MG tablet TAKE ONE TABLET BY MOUTH EVERY 8 HOURS AS NEEDED (Patient taking differently: TAKE ONE TABLET BY MOUTH EVERY 8 HOURS AS NEEDED FOR PAIN) 60 tablet 1 Past Week at Unknown time  . Methylsulfonylmethane (MSM PO) Take 750 m by mouth daily. Has additional Turneric   06/11/2016 at Unknown time  . Multiple Vitamins-Minerals (WOMENS MULTI PO) Take 1 tablet by mouth daily.   06/11/2016 at Unknown time  . norgestrel-ethinyl estradiol (LO/OVRAL,CRYSELLE) 0.3-30 MG-MCG tablet Take 1 tablet by mouth daily. 1 Package 11 06/11/2016 at Unknown time  . OVER THE COUNTER MEDICATION Take 1 tablet by mouth daily. Gummy Vitamins B-12  250 mcg D-3    1000 iu   06/11/2016 at Unknown time  . oxyCODONE-acetaminophen (PERCOCET/ROXICET) 5-325 MG tablet Take 1 tablet by mouth every 6 (six) hours as needed for severe pain.   06/11/2016 at Unknown time  . phentermine 37.5 MG capsule Take 37.5 mg by mouth every morning.   06/11/2016 at Unknown time  . Probiotic Product (PROBIOTIC FORMULA PO)  Take 30 mLs by mouth 2 (two) times daily. Liquid   06/11/2016 at Unknown time  . TURMERIC PO Take 1 tablet by mouth daily.   06/11/2016 at Unknown time    ROS  With her boyfriend since 12/17 Unemployed  Blood pressure 116/82, pulse 84, temperature 98.2 F (36.8 C), resp. rate 16, last menstrual period 05/23/2016, SpO2 100 %. Physical Exam  Heart- rrr Lungs- CTAB Abd- benign  Results for orders placed or performed during the hospital encounter of 06/12/16 (from the past 24 hour(s))  Pregnancy, urine     Status: None   Collection Time: 06/12/16  8:02 AM  Result Value Ref Range   Preg Test, Ur NEGATIVE NEGATIVE    No results found.  Assessment/Plan: Chronic pelvic pain- plan for diagnostic laparoscopy.  She understands the risks of surgery, including, but not to infection, bleeding, DVTs, damage to bowel, bladder, ureters. She wishes to proceed.      Emily Filbert 06/12/2016, 9:49 AM

## 2016-06-12 NOTE — Discharge Instructions (Addendum)
Diagnostic Laparoscopy, Care After Refer to this sheet in the next few weeks. These instructions provide you with information about caring for yourself after your procedure. Your health care provider may also give you more specific instructions. Your treatment has been planned according to current medical practices, but problems sometimes occur. Call your health care provider if you have any problems or questions after your procedure. What can I expect after the procedure? After your procedure, it is common to have mild discomfort in the throat and abdomen. Follow these instructions at home:  Take over-the-counter and prescription medicines only as told by your health care provider.  Do not drive for 24 hours if you received a sedative.  Return to your normal activities as told by your health care provider.  Do not take baths, swim, or use a hot tub until your health care provider approves. You may shower.  Follow instructions from your health care provider about how to take care of your incision. Make sure you: ? Wash your hands with soap and water before you change your bandage (dressing). If soap and water are not available, use hand sanitizer. ? Change your dressing as told by your health care provider. ? Leave stitches (sutures), skin glue, or adhesive strips in place. These skin closures may need to stay in place for 2 weeks or longer. If adhesive strip edges start to loosen and curl up, you may trim the loose edges. Do not remove adhesive strips completely unless your health care provider tells you to do that.  Check your incision area every day for signs of infection. Check for: ? More redness, swelling, or pain. ? More fluid or blood. ? Warmth. ? Pus or a bad smell.  It is your responsibility to get the results of your procedure. Ask your health care provider or the department performing the procedure when your results will be ready. Contact a health care provider if:  There is  new pain in your shoulders.  You feel light-headed or faint.  You are unable to pass gas or unable to have a bowel movement.  You feel nauseous or you vomit.  You develop a rash.  You have more redness, swelling, or pain around your incision.  You have more fluid or blood coming from your incision.  Your incision feels warm to the touch.  You have pus or a bad smell coming from your incision.  You have a fever or chills. Get help right away if:  Your pain is getting worse.  You have ongoing vomiting.  The edges of your incision open up.  You have trouble breathing.  You have chest pain. This information is not intended to replace advice given to you by your health care provider. Make sure you discuss any questions you have with your health care provider. Document Released: 12/05/2014 Document Revised: 06/01/2015 Document Reviewed: 09/06/2014 Elsevier Interactive Patient Education  2018 [place] [facility] Instructions  NO IBUPROFEN PRODUCTS UNTIL: 4:30 PM TODAY  Activity: Get plenty of rest for the remainder of the day. A responsible individual must stay with you for 24 hours following the procedure.  For the next 24 hours, DO NOT: -Drive a car -Paediatric nurse -Drink alcoholic beverages -Take any medication unless instructed by your physician -Make any legal decisions or sign important papers.  Meals: Start with liquid foods such as gelatin or soup. Progress to regular foods as tolerated. Avoid greasy, spicy, heavy foods. If nausea and/or vomiting occur, drink  only clear liquids until the nausea and/or vomiting subsides. Call your physician if vomiting continues.  Special Instructions/Symptoms: Your throat may feel dry or sore from the anesthesia or the breathing tube placed in your throat during surgery. If this causes discomfort, gargle with warm salt water. The discomfort should disappear within 24 hours.  If you had a scopolamine  patch placed behind your ear for the management of post- operative nausea and/or vomiting:  1. The medication in the patch is effective for 72 hours, after which it should be removed.  Wrap patch in a tissue and discard in the trash. Wash hands thoroughly with soap and water. 2. You may remove the patch earlier than 72 hours if you experience unpleasant side effects which may include dry mouth, dizziness or visual disturbances. 3. Avoid touching the patch. Wash your hands with soap and water after contact with the patch.

## 2016-06-12 NOTE — Op Note (Addendum)
06/12/2016  10:49 AM  PATIENT:  Carla Hodges  36 y.o. female  PRE-OPERATIVE DIAGNOSIS:  Chronic pelvic pain, fibroid, morbid obesity   POST-OPERATIVE DIAGNOSIS:Same + Stage 2 endometriosis Pelvic Pain  PROCEDURE:  Procedure(s): LAPAROSCOPY DIAGNOSTIC WITH PERITONEAL BIOSPSY (N/A)  SURGEON:  Surgeon(s) and Role:    * Yolando Gillum C, MD - Primary  ANESTHESIA:   general  EBL:  Total I/O In: 1000 [I.V.:1000] Out: -   BLOOD ADMINISTERED:none  DRAINS: none   LOCAL MEDICATIONS USED:  MARCAINE     SPECIMEN:  Source of Specimen:  peritoneal biops from the posterior edge of the cervix  DISPOSITION OF SPECIMEN:  PATHOLOGY  COUNTS:  YES  TOURNIQUET:  * No tourniquets in log *  DICTATION: .Dragon Dictation  PLAN OF CARE: Discharge to home after PACU  PATIENT DISPOSITION:  PACU - hemodynamically stable.   Delay start of Pharmacological VTE agent (>24hrs) due to surgical blood loss or risk of bleeding: not applicable   The risks, benefits, alternatives of surgery were explained understood, accepted. All questions were answered.  She was taken to the operating room and general anesthesia was applied without complication. She was placed in dorsal lithotomy position. Her abdomen and vagina were prepped and draped in the usual sterile fashion. A time out procedure was done. A bimanual exam revealed  mobile uterus with nonenlarged adnexa. The exam was limited by her body habitus. She has a good pubic arch for vaginal surgery. A Hulka manipulator was placed on the cervix. Gloves were changed and attention was turned to the abdomen. Approximately 10 mL of 0.5% Marcaine was used to infiltrate the subcutaneous tissue at the umbilicus. A vertical 5 mmincision was made. A Veres needle was placed in the pelvis. Low-flow CO2 was used to insufflate the abdomen to approximately 3 L. After good pneumoperitoneum was established, a 5 mm trocar was placed. Laparoscopy confirmed correct placement. I  placed a 5 mm port in each lower quadrant under direct laparoscopic visualization. She was placed in the Trendelenburg position. Patient abdominal pressure was always less than 15. Her upper abdomen appeared normal ALTHOUGH THE GALLBLADDER WAS MORE DISTENDED than I usually see. I inspected the pelvis in an alpha manner. Both ovaries had chocolate staining c/w endometriosis. There was a tenuous strand of tissue hanging from the right ovary. I removed this with traction and sent it to pathology. The posterior cul de sac had several sites of ENDOMETRIOSIS. The largest was on the posterior aspect of the cervix. I biopsied this area.There was no bleeding from any of the biopsy sites. Her uterus has a 5-6 cm fundal fibroid. The anterior cul de sac was normal.The CO2 was allowed to escape from her abdomen.  The subcuticular closure was done with 4-0 Vicryl suture. The Hulka manipulator was removed. She was extubated and taken to recovery in stable condition. She tolerated the procedure well. The instrument, sponge, and needle counts were correct.

## 2016-06-12 NOTE — Transfer of Care (Signed)
Immediate Anesthesia Transfer of Care Note  Patient: Carla Hodges  Procedure(s) Performed: Procedure(s): LAPAROSCOPY DIAGNOSTIC WITH PERITONEAL BIOSPSY (N/A)  Patient Location: PACU  Anesthesia Type:General  Level of Consciousness: oriented, sedated and patient cooperative  Airway & Oxygen Therapy: Patient Spontanous Breathing and Patient connected to nasal cannula oxygen  Post-op Assessment: Report given to RN and Post -op Vital signs reviewed and stable  Post vital signs: Reviewed and stable  Last Vitals:  Vitals:   06/12/16 0810  BP: 116/82  Pulse: 84  Resp: 16  Temp: 36.8 C    Last Pain:  Vitals:   06/12/16 0810  PainSc: 4       Patients Stated Pain Goal: 4 (38/18/40 3754)  Complications: No apparent anesthesia complications

## 2016-06-12 NOTE — Anesthesia Preprocedure Evaluation (Signed)
Anesthesia Evaluation  Patient identified by MRN, date of birth, ID band Patient awake    Reviewed: Allergy & Precautions, NPO status , Patient's Chart, lab work & pertinent test results  History of Anesthesia Complications Negative for: history of anesthetic complications  Airway Mallampati: II  TM Distance: >3 FB Neck ROM: Full    Dental  (+) Dental Advisory Given   Pulmonary asthma , Current Smoker, former smoker,    breath sounds clear to auscultation       Cardiovascular negative cardio ROS   Rhythm:Regular Rate:Normal     Neuro/Psych Depression negative neurological ROS     GI/Hepatic Neg liver ROS, GERD  Controlled,  Endo/Other  Morbid obesity  Renal/GU negative Renal ROS     Musculoskeletal   Abdominal (+) + obese,   Peds  Hematology negative hematology ROS (+)   Anesthesia Other Findings   Reproductive/Obstetrics                             Anesthesia Physical  Anesthesia Plan  ASA: II and emergent  Anesthesia Plan: General   Post-op Pain Management:    Induction: Intravenous  PONV Risk Score and Plan: 3 and Ondansetron, Dexamethasone, Propofol, Midazolam and Scopolamine patch - Pre-op  Airway Management Planned: Oral ETT  Additional Equipment:   Intra-op Plan:   Post-operative Plan: Extubation in OR  Informed Consent: I have reviewed the patients History and Physical, chart, labs and discussed the procedure including the risks, benefits and alternatives for the proposed anesthesia with the patient or authorized representative who has indicated his/her understanding and acceptance.   Dental advisory given  Plan Discussed with: CRNA and Surgeon  Anesthesia Plan Comments: (Plan routine monitors, GETA)        Anesthesia Quick Evaluation

## 2016-06-12 NOTE — Anesthesia Preprocedure Evaluation (Addendum)
Anesthesia Evaluation  Patient identified by MRN, date of birth, ID band Patient awake    Reviewed: Allergy & Precautions, NPO status , Patient's Chart, lab work & pertinent test results  History of Anesthesia Complications Negative for: history of anesthetic complications  Airway Mallampati: II  TM Distance: >3 FB Neck ROM: Full    Dental  (+) Dental Advisory Given   Pulmonary asthma , Current Smoker, former smoker (quit 2013),    breath sounds clear to auscultation       Cardiovascular negative cardio ROS   Rhythm:Regular Rate:Normal     Neuro/Psych Depression negative neurological ROS     GI/Hepatic Neg liver ROS, GERD  Controlled,  Endo/Other  Morbid obesity  Renal/GU negative Renal ROS     Musculoskeletal   Abdominal (+) + obese,   Peds  Hematology negative hematology ROS (+)   Anesthesia Other Findings   Reproductive/Obstetrics                           Anesthesia Physical Anesthesia Plan  ASA: II  Anesthesia Plan: General   Post-op Pain Management:    Induction: Intravenous  PONV Risk Score and Plan: 3 and Ondansetron, Dexamethasone, Propofol, Midazolam and Scopolamine patch - Pre-op  Airway Management Planned: Oral ETT  Additional Equipment:   Intra-op Plan:   Post-operative Plan: Extubation in OR  Informed Consent: I have reviewed the patients History and Physical, chart, labs and discussed the procedure including the risks, benefits and alternatives for the proposed anesthesia with the patient or authorized representative who has indicated his/her understanding and acceptance.   Dental advisory given  Plan Discussed with: CRNA and Surgeon  Anesthesia Plan Comments: (Plan routine monitors, GETA)        Anesthesia Quick Evaluation

## 2016-06-12 NOTE — Anesthesia Procedure Notes (Signed)
Procedure Name: Intubation Date/Time: 06/12/2016 2:46 PM Performed by: Jonna Munro Pre-anesthesia Checklist: Patient identified, Emergency Drugs available, Suction available, Timeout performed and Patient being monitored Patient Re-evaluated:Patient Re-evaluated prior to inductionOxygen Delivery Method: Circle system utilized Preoxygenation: Pre-oxygenation with 100% oxygen Intubation Type: IV induction, Cricoid Pressure applied and Rapid sequence Laryngoscope Size: Mac and 3 Grade View: Grade I Tube type: Oral Tube size: 7.0 mm Number of attempts: 1 Airway Equipment and Method: Stylet Placement Confirmation: ETT inserted through vocal cords under direct vision,  positive ETCO2 and breath sounds checked- equal and bilateral Secured at: 21 cm Tube secured with: Tape Dental Injury: Teeth and Oropharynx as per pre-operative assessment

## 2016-06-12 NOTE — H&P (Signed)
I was notified that the patient fainted when standing.  Her exam was normal however a stat cbc showed a 3 gram drop in hbg since her pre op cbc. I will take her back to the OR to look for the etiology of blood loss.

## 2016-06-12 NOTE — Anesthesia Procedure Notes (Signed)
Procedure Name: Intubation Date/Time: 06/12/2016 10:10 AM Performed by: Tobin Chad Pre-anesthesia Checklist: Patient identified, Emergency Drugs available, Suction available and Patient being monitored Patient Re-evaluated:Patient Re-evaluated prior to inductionOxygen Delivery Method: Circle system utilized and Simple face mask Preoxygenation: Pre-oxygenation with 100% oxygen Intubation Type: IV induction and Inhalational induction Ventilation: Mask ventilation without difficulty Laryngoscope Size: Mac and 3 Grade View: Grade II Tube size: 7.0 mm Number of attempts: 1 Airway Equipment and Method: Stylet Secured at: 22 cm Tube secured with: Tape Dental Injury: Teeth and Oropharynx as per pre-operative assessment

## 2016-06-12 NOTE — Transfer of Care (Signed)
Immediate Anesthesia Transfer of Care Note  Patient: Carla Hodges  Procedure(s) Performed: Procedure(s): LAPAROSCOPY DIAGNOSTIC, EVACUATION OF HEMATOMA (N/A)  Patient Location: PACU  Anesthesia Type:General  Level of Consciousness: awake, alert  and oriented  Airway & Oxygen Therapy: Patient Spontanous Breathing and Patient connected to nasal cannula oxygen  Post-op Assessment: Report given to RN and Post -op Vital signs reviewed and stable  Post vital signs: Reviewed and stable  Last Vitals:  Vitals:   06/12/16 1415 06/12/16 1430  BP: 94/63 (!) 104/58  Pulse: 75 85  Resp: (!) 25 (!) 22  Temp:      Last Pain:  Vitals:   06/12/16 1415  PainSc: Asleep      Patients Stated Pain Goal: 4 (94/07/68 0881)  Complications: No apparent anesthesia complications

## 2016-06-12 NOTE — Anesthesia Postprocedure Evaluation (Signed)
Anesthesia Post Note  Patient: Carla Hodges  Procedure(s) Performed: Procedure(s) (LRB): LAPAROSCOPY DIAGNOSTIC WITH PERITONEAL BIOSPSY (N/A)     Patient location during evaluation: PACU Anesthesia Type: General Level of consciousness: awake and alert, patient cooperative and oriented Pain management: pain level controlled Vital Signs Assessment: post-procedure vital signs reviewed and stable Respiratory status: spontaneous breathing, nonlabored ventilation and respiratory function stable Cardiovascular status: blood pressure returned to baseline and stable Postop Assessment: no signs of nausea or vomiting Anesthetic complications: no    Last Vitals:  Vitals:   06/12/16 1055 06/12/16 1100  BP: 122/85 118/79  Pulse: 100 90  Resp: (!) 26 (!) 23  Temp: 37.3 C     Last Pain:  Vitals:   06/12/16 1115  PainSc: 7    Pain Goal: Patients Stated Pain Goal: 4 (06/12/16 1115)               Seleta Rhymes. Dejon Jungman

## 2016-06-12 NOTE — Op Note (Signed)
06/12/2016  3:38 PM  PATIENT:  Carla Hodges  36 y.o. female  PRE-OPERATIVE DIAGNOSIS: post op bleeding   POST-OPERATIVE DIAGNOSIS:  Post op bleeding   PROCEDURE:  Procedure(s): LAPAROSCOPY DIAGNOSTIC, EVACUATION OF HEMATOMA (N/A)  SURGEON:  Surgeon(s) and Role:    * Byrdie Miyazaki C, MD - Primary  ANESTHESIA:   general  EBL:  Total I/O In: 2100 [I.V.:2100] Out: 1005 [Blood:1005]  BLOOD ADMINISTERED:none  DRAINS: none   LOCAL MEDICATIONS USED:  NONE  SPECIMEN:  No Specimen  DISPOSITION OF SPECIMEN:  N/A  COUNTS:  YES  TOURNIQUET:  * No tourniquets in log *  DICTATION: .Dragon Dictation  PLAN OF CARE: Admit for overnight observation  PATIENT DISPOSITION:  PACU - hemodynamically stable.   Delay start of Pharmacological VTE agent (>24hrs) due to surgical blood loss or risk of bleeding: not applicable   The risks, benefits, alternatives of surgery were explained understood, accepted. All questions were answered. Marland Kitchen She was taken to the operating room and general anesthesia was applied without complication. She was placed in dorsal lithotomy position. Her abdomen and vagina were prepped and draped in the usual sterile fashion. A time out procedure was done. A Hulka manipulator was placed on the cervix. Gloves were changed and attention was turned to the abdomen. Approximately 10 mL of 0.5% Marcaine was used to infiltrate the subcutaneous tissue at the umbilicus. A vertical 5 mmm incision was made. A Veres needle was placed in the pelvis. Low-flow CO2 was used to insufflate the abdomen to approximately 3 L. After good pneumoperitoneum was established, a 11 mm trocar was placed. Laparoscopy confirmed correct placement. She was placed in the Trendelenburg position. There was about 1 liter of clotted blood in the pelvis. The biopsy site was hemostatic. The only site of anything suspicous was the trocar site on her left. There was a clot at this site. I removed the clot and no  bleeding was noted. There was no active bleeding anywhere. The CO2 was allowed to escape from her abdomen. The subcuticular closure was done with 4-0 Vicryl suture. The Hulka manipulator was removed. She was extubated and taken to recovery in stable condition. She tolerated the procedure well. The instrument, sponge, and needle counts were correct.

## 2016-06-13 ENCOUNTER — Encounter (HOSPITAL_COMMUNITY): Payer: Self-pay | Admitting: Obstetrics & Gynecology

## 2016-06-13 LAB — CBC
HCT: 19.3 % — ABNORMAL LOW (ref 36.0–46.0)
Hemoglobin: 6.4 g/dL — CL (ref 12.0–15.0)
MCH: 29.2 pg (ref 26.0–34.0)
MCHC: 33.2 g/dL (ref 30.0–36.0)
MCV: 88.1 fL (ref 78.0–100.0)
PLATELETS: 258 10*3/uL (ref 150–400)
RBC: 2.19 MIL/uL — AB (ref 3.87–5.11)
RDW: 14.4 % (ref 11.5–15.5)
WBC: 11.6 10*3/uL — ABNORMAL HIGH (ref 4.0–10.5)

## 2016-06-13 LAB — PREPARE RBC (CROSSMATCH)

## 2016-06-13 MED ORDER — SODIUM CHLORIDE 0.9 % IV SOLN
Freq: Once | INTRAVENOUS | Status: AC
  Administered 2016-06-13: 13:00:00 via INTRAVENOUS

## 2016-06-13 NOTE — Progress Notes (Signed)
Discharge instructions & prescription given to pt.

## 2016-06-13 NOTE — Anesthesia Postprocedure Evaluation (Signed)
Anesthesia Post Note  Patient: Carla Hodges  Procedure(s) Performed: Procedure(s) (LRB): LAPAROSCOPY DIAGNOSTIC, EVACUATION OF HEMATOMA (N/A)     Patient location during evaluation: [facility] Anesthesia Type: General Level of consciousness: awake and alert Pain management: pain level controlled Vital Signs Assessment: post-procedure vital signs reviewed and stable Respiratory status: spontaneous breathing Cardiovascular status: blood pressure returned to baseline Postop Assessment: no signs of nausea or vomiting and adequate PO intake Anesthetic complications: no    Last Vitals:  Vitals:   06/12/16 2328 06/13/16 0354  BP: 116/72 (!) 100/57  Pulse: (!) 114 (!) 112  Resp: 18 18  Temp: 36.8 C 37.1 C    Last Pain:  Vitals:   06/13/16 0645  TempSrc:   PainSc: Asleep   Pain Goal: Patients Stated Pain Goal: 4 (06/13/16 0405)               Adelia Baptista

## 2016-06-13 NOTE — Progress Notes (Signed)
1 Day Post-Op Procedure(s) (LRB): LAPAROSCOPY DIAGNOSTIC, EVACUATION OF HEMATOMA (N/A)  Subjective: Patient reports tolerating PO, + flatus and no problems voiding.  She is ambulating without dizziness but does feel weak.  Objective: I have reviewed patient's vital signs, intake and output, medications and labs.  General: alert Resp: clear to auscultation bilaterally Cardio: regular rate and rhythm, S1, S2 normal, no murmur, click, rub or gallop GI: soft, non-tender; bowel sounds normal; no masses,  no organomegaly  Assessment: s/p Procedure(s): LAPAROSCOPY DIAGNOSTIC, EVACUATION OF HEMATOMA (N/A): stable  Plan: plan for transfusion for anemia  Plan for discharge home after blood is in  LOS: 0 days    Jarrick Fjeld C Deaire Mcwhirter 06/13/2016, 7:12 AM

## 2016-06-13 NOTE — Anesthesia Postprocedure Evaluation (Signed)
Anesthesia Post Note  Patient: Carla Hodges  Procedure(s) Performed: Procedure(s) (LRB): LAPAROSCOPY DIAGNOSTIC, EVACUATION OF HEMATOMA (N/A)     Anesthesia Post Evaluation  Last Vitals:  Vitals:   06/12/16 2328 06/13/16 0354  BP: 116/72 (!) 100/57  Pulse: (!) 114 (!) 112  Resp: 18 18  Temp: 36.8 C 37.1 C    Last Pain:  Vitals:   06/13/16 0645  TempSrc:   PainSc: Asleep   Pain Goal: Patients Stated Pain Goal: 4 (06/13/16 0405)               Lydia Toren

## 2016-06-14 ENCOUNTER — Telehealth: Payer: Self-pay | Admitting: Internal Medicine

## 2016-06-14 DIAGNOSIS — Z9289 Personal history of other medical treatment: Secondary | ICD-10-CM

## 2016-06-14 LAB — TYPE AND SCREEN
ABO/RH(D): O POS
ANTIBODY SCREEN: NEGATIVE
UNIT DIVISION: 0
Unit division: 0

## 2016-06-14 LAB — BPAM RBC
Blood Product Expiration Date: 201806252359
Blood Product Expiration Date: 201806252359
ISSUE DATE / TIME: 201806071230
ISSUE DATE / TIME: 201806070958
Unit Type and Rh: 5100
Unit Type and Rh: 5100

## 2016-06-14 NOTE — Telephone Encounter (Signed)
Placed orders. Patient aware.

## 2016-06-14 NOTE — Telephone Encounter (Signed)
Patient requested CXR because boyfriend has concerns he could get TB from her. 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 Patient does endorse cough from time to time and says she would like reassurance of negative CXR. Placed order.  Olene Floss, MD [place], PGY-2

## 2016-06-19 ENCOUNTER — Telehealth: Payer: Self-pay | Admitting: Obstetrics & Gynecology

## 2016-06-19 NOTE — Telephone Encounter (Signed)
Called patient, no answer- left message stating we are trying to reach you to return your phone call, please call us back 

## 2016-06-19 NOTE — Telephone Encounter (Signed)
Patient called and is requesting a Rx refill

## 2016-06-20 NOTE — Telephone Encounter (Signed)
Spoke with patient concerning her request for more pain meds to be called into her pharmacy. I explained to patient we do not call in pain medications. I advised patient to try otc tylenol to see if that will help her pain. If taking this does not help her pain in a couple days she should report to the MAU to be seen. Patient voice understanding.Marland Kitchen

## 2016-06-21 ENCOUNTER — Ambulatory Visit
Admission: RE | Admit: 2016-06-21 | Discharge: 2016-06-21 | Disposition: A | Discharge status: Home / Self Care | Payer: No Typology Code available for payment source | Source: Ambulatory Visit | Attending: Family Medicine | Admitting: Family Medicine

## 2016-06-21 DIAGNOSIS — Z9289 Personal history of other medical treatment: Secondary | ICD-10-CM

## 2016-06-25 ENCOUNTER — Encounter: Payer: Self-pay | Admitting: Internal Medicine

## 2016-07-03 ENCOUNTER — Encounter: Payer: Self-pay | Admitting: Obstetrics & Gynecology

## 2016-07-03 ENCOUNTER — Ambulatory Visit: Payer: No Typology Code available for payment source | Admitting: Obstetrics & Gynecology

## 2016-07-03 VITALS — BP 101/72 | HR 77 | Ht 65.0 in

## 2016-07-03 DIAGNOSIS — Z9889 Other specified postprocedural states: Secondary | ICD-10-CM

## 2016-07-03 MED ORDER — IBUPROFEN 800 MG PO TABS: 800.0000 mg | ORAL_TABLET | Freq: Three times a day (TID) | ORAL | 1 refills | Status: DC | PRN

## 2016-07-03 MED ORDER — LEVONORGEST-ETH ESTRAD 91-DAY 0.15-0.03 &0.01 MG PO TABS: 1.0000 | ORAL_TABLET | Freq: Every day | ORAL | 4 refills | Status: DC

## 2016-07-03 MED ORDER — OXYCODONE-ACETAMINOPHEN 5-325 MG PO TABS: 1.0000 | ORAL_TABLET | Freq: Four times a day (QID) | ORAL | 0 refills | Status: DC | PRN

## 2016-07-03 NOTE — Progress Notes (Signed)
   Subjective:    Patient ID: Carla Hodges, female    DOB: 17-Dec-1980, 36 y.o.   MRN: [id number]  HPI 36 yo S AA P0 here for a pos top visit. She had a diagnostic laparoscopy and excision of endometriosis (stage 2 on exam). She then fainted in the recovery room and her hbg had dropped. I took her back to the OR and evacuated about a liter of blood from her pelvis. The site of bleeding was the trocar site on her left. It was hemostatic at the time of completion of surgery.  Review of Systems Sometimes has dyspareunia, well- endowed    Objective:   Physical Exam [id number] Breathing, conversing, and ambulating normally Incisions healed well       Assessment & Plan:  Endometriosis- rec continuous OCPs, camrese prescribed If this doesn't help, then rec trial of depo Lupron RTC 3 months for follow up

## 2016-07-11 ENCOUNTER — Telehealth: Payer: Self-pay | Admitting: Obstetrics & Gynecology

## 2016-07-11 NOTE — Telephone Encounter (Signed)
Patient called and stated she was suppose to be getting some samples left at the desk by Dr. Hulan Fray. Patient would like a call back so she can speak with Dr. Hulan Fray about getting her samples.

## 2016-07-17 NOTE — Telephone Encounter (Signed)
Dr.dove  SEE BELOW

## 2016-07-31 NOTE — Discharge Summary (Signed)
OB Discharge Summary     Patient Name: Carla Hodges DOB: 12/23/1980 MRN: [id number]  Date of admission: 06/12/2016 Delivering MD: This patient has no babies on file.  Date of discharge: 07/31/2016  Admitting diagnosis: Chronic pelvic pain Morbid obestiy  Secondary diagnosis:  Active Problems:   Post-operative state Anemia     Discharge diagnosis: Anemia                                                                                                 Hospital course:  She underwent what seemed to be an uncomplicated diagnostic laparoscopy and excision of endometriosis. She became faint in the recovery room and her hbg was noted to be 9.1. Her pre op hbg was 12.9. I took her back to the OR and looked in laparoscopically. I removed a large amount of clotted blood. The only site of potential previous bleeding was the trocar site in the LLQ. All was hemostatic. The following morning her hbg was 6.4. She agreed to a transfusion, requesting that she go home after the blood transfusion was finished. She was tolerating po, voiding, and ambulating without difficulty.   Physical exam  Vitals:   06/13/16 1230 06/13/16 1250 06/13/16 1305 06/13/16 1435  BP: 98/64 98/60 (!) 96/55 104/64  Pulse: 72 88 87 99  Resp: 18 20 18 20   Temp: 98.4 F (36.9 C) 98.2 F (36.8 C) 98 F (36.7 C) 98.9 F (37.2 C)  TempSrc:  Oral Oral Oral  SpO2:  99%  100%   General: alert Incision: Dressing is clean, dry, and intact DVT Evaluation: No evidence of DVT seen on physical exam. Labs: Lab Results  Component Value Date   WBC 11.6 (H) 06/13/2016   HGB 6.4 (LL) 06/13/2016   HCT 19.3 (L) 06/13/2016   MCV 88.1 06/13/2016   PLT 258 06/13/2016   CMP Latest Ref Rng & Units 06/08/2016  Glucose 65 - 99 mg/dL 95  BUN 6 - 20 mg/dL 8  Creatinine 0.44 - 1.00 mg/dL 0.68  Sodium 135 - 145 mmol/L 135  Potassium 3.5 - 5.1 mmol/L 3.3(L)  Chloride 101 - 111 mmol/L 102  CO2 22 - 32 mmol/L 23  Calcium 8.9 - 10.3 mg/dL  8.6(L)  Total Protein 6.5 - 8.1 g/dL 7.1  Total Bilirubin 0.3 - 1.2 mg/dL 0.9  Alkaline Phos 38 - 126 U/L 63  AST 15 - 41 U/L 30  ALT 14 - 54 U/L 27    Discharge instruction: per After Visit Summary and "Baby and Me Booklet".  After visit meds:  Allergies as of 06/13/2016      Reactions   Latex Other (See Comments)   "break out down there" with latex condom use      Medication List    TAKE these medications   CHORIOGONADOTROPIN ALFA [place] Inject 35 Units into the skin every Monday, Wednesday, and Friday.   ibuprofen 600 MG tablet Commonly known as:  ADVIL,MOTRIN Take 1 tablet (600 mg total) by mouth every 6 (six) hours as needed.   MSM PO Take 750 m by mouth daily. Has  additional Turneric   norgestrel-ethinyl estradiol 0.3-30 MG-MCG tablet Commonly known as:  LO/OVRAL,CRYSELLE Take 1 tablet by mouth daily.   OVER THE COUNTER MEDICATION Take 1 tablet by mouth daily. Gummy Vitamins B-12  250 mcg D-3    1000 iu   phentermine 37.5 MG capsule Take 37.5 mg by mouth every morning.   PROBIOTIC FORMULA PO Take 30 mLs by mouth 2 (two) times daily. Liquid   TURMERIC PO Take 1 tablet by mouth daily.   WOMENS MULTI PO Take 1 tablet by mouth daily.       Diet: routine diet  Activity: Advance as tolerated. Pelvic rest for 6 weeks.   Outpatient follow up:6 weeks Follow up Appt:No future appointments. Follow up Visit:No Follow-up on file.    07/31/2016 Emily Filbert, MD

## 2016-09-11 ENCOUNTER — Telehealth: Payer: Self-pay | Admitting: Internal Medicine

## 2016-09-11 DIAGNOSIS — Z973 Presence of spectacles and contact lenses: Secondary | ICD-10-CM

## 2016-09-11 NOTE — Telephone Encounter (Signed)
Would like a referral to Dr Idolina Primer on [facility].  She wears glasses and needs a new pair

## 2016-09-11 NOTE — Telephone Encounter (Signed)
Placed referral, though may not be needed.

## 2016-09-28 ENCOUNTER — Other Ambulatory Visit: Payer: Self-pay | Admitting: Obstetrics & Gynecology

## 2016-10-02 ENCOUNTER — Encounter: Payer: Self-pay | Admitting: Obstetrics and Gynecology

## 2016-10-02 ENCOUNTER — Encounter: Payer: Self-pay | Admitting: Obstetrics & Gynecology

## 2016-10-02 ENCOUNTER — Ambulatory Visit (INDEPENDENT_AMBULATORY_CARE_PROVIDER_SITE_OTHER): Payer: Self-pay | Admitting: Obstetrics & Gynecology

## 2016-10-02 VITALS — BP 109/74 | HR 88 | Wt 258.0 lb

## 2016-10-02 DIAGNOSIS — N809 Endometriosis, unspecified: Secondary | ICD-10-CM | POA: Insufficient documentation

## 2016-10-02 NOTE — Progress Notes (Signed)
   Subjective:    Patient ID: Carla Hodges, female    DOB: [birth year], 36 y.o.   MRN: [id number]  HPI 36 yo lady here for follow up for her endometriosis. She is taking her OCPs but not in a continuous fashion. She is still having pain.   Review of Systems     Objective:   Physical Exam Well nourished, well hydrated black female, no apparent distress Breathing, conversing, and ambulating normally   Assessment & Plan:  Biopsy-proven endometriosis currently on OCPs- Rec extended cycle OCPs- discussed [name]

## 2016-11-05 ENCOUNTER — Telehealth: Payer: Self-pay

## 2016-11-05 NOTE — Telephone Encounter (Signed)
Pt called the front desk and stated that she is currently taking a BCP and she now has a new BCP that Dr. Hulan Fray prescribed.  Pt wants to know what to do?   Called pt and pt stated that she was given two different types of OCP samples and she wanted to know why Dr. Hulan Fray gave the new to her.  Pt also stated that the new ones that she was given are gel tabs and she does not eat meat.  Pt also stated that the provider informed her that she would have hot flashes.  I explained that the slight increase hormone could cause hot flashes.  Pt stated that she was suppose to take BCP so she does not have to have a period.  I explained to the pt that she does not have to take the new BCP that was prescribed she can continue to take the old OCP but to skip the 4th week and start taking a new pill pack so that she does not have a period.   Pt still confused about what medication to take.  I informed pt that I will contact Dr. Hulan Fray and get back with her in regards to proper Rx.  Pt requested to actually speak with Dr. Hulan Fray.  I informed her that when I get in touch with I will let the provider know.  Pt stated understanding.

## 2016-11-15 ENCOUNTER — Encounter: Payer: Self-pay | Admitting: General Practice

## 2016-11-25 ENCOUNTER — Ambulatory Visit (INDEPENDENT_AMBULATORY_CARE_PROVIDER_SITE_OTHER): Payer: Self-pay | Admitting: Obstetrics & Gynecology

## 2016-11-25 ENCOUNTER — Encounter: Payer: Self-pay | Admitting: Obstetrics & Gynecology

## 2016-11-25 VITALS — BP 118/87 | HR 74 | Wt 267.1 lb

## 2016-11-25 DIAGNOSIS — N809 Endometriosis, unspecified: Secondary | ICD-10-CM

## 2016-11-25 NOTE — Progress Notes (Signed)
   Subjective:    Patient ID: Carla Hodges, female    DOB: 1980/03/27, 36 y.o.   MRN: [id number]  HPI 36 yo here because she had a question about the OCP samples that I had given her. She generally has to pay $20 per month. I had given her 2 different types as we didn't have that many of one brand. She reports that her endometriosis pain is doing well right now.  Review of Systems     Objective:   Physical Exam Breathing, conversing, and ambulating normally Well nourished, well hydrated Black female, no apparent distress      Assessment & Plan:  Endometriosis- continue on continuous OCPs Follow up 6 months

## 2016-12-23 ENCOUNTER — Ambulatory Visit (INDEPENDENT_AMBULATORY_CARE_PROVIDER_SITE_OTHER): Payer: Self-pay | Admitting: Obstetrics & Gynecology

## 2016-12-23 ENCOUNTER — Encounter: Payer: Self-pay | Admitting: Obstetrics & Gynecology

## 2016-12-23 VITALS — BP 114/63 | HR 66 | Wt 274.0 lb

## 2016-12-23 DIAGNOSIS — N809 Endometriosis, unspecified: Secondary | ICD-10-CM

## 2016-12-23 DIAGNOSIS — Z3042 Encounter for surveillance of injectable contraceptive: Secondary | ICD-10-CM

## 2016-12-23 MED ORDER — IBUPROFEN 800 MG PO TABS: 800.0000 mg | ORAL_TABLET | Freq: Three times a day (TID) | ORAL | 1 refills | Status: DC | PRN

## 2016-12-23 MED ORDER — ELAGOLIX SODIUM 150 MG PO TABS: 1.0000 | ORAL_TABLET | Freq: Every day | ORAL | 12 refills | Status: DC

## 2016-12-23 MED ORDER — MEDROXYPROGESTERONE ACETATE 150 MG/ML IM SUSP
150.0000 mg | Freq: Once | INTRAMUSCULAR | Status: AC
  Administered 2016-12-23: 150 mg via INTRAMUSCULAR

## 2016-12-23 MED ORDER — IBUPROFEN 600 MG PO TABS: 600.0000 mg | ORAL_TABLET | Freq: Four times a day (QID) | ORAL | 1 refills | Status: DC | PRN

## 2016-12-23 NOTE — Addendum Note (Signed)
Addended by: Emily Filbert on: 12/23/2016 08:42 AM   Modules accepted: Orders

## 2016-12-23 NOTE — Addendum Note (Signed)
Addended by: Emily Filbert on: 12/23/2016 11:21 AM   Modules accepted: Orders

## 2016-12-23 NOTE — Progress Notes (Signed)
Patient ID: Carla Hodges, female   DOB: 08/10/1980, 36 y.o.   MRN: [id number]  No chief complaint on file.   HPI Carla Hodges is a 36 y.o. female.  Single P0 here today with continued pelvic pain. She has biopsy proven stage 1 endometriois. She is also having irregular bleeding. She brought pictures to show me.  HPI  Past Medical History:  Diagnosis Date  . Bronchitis, chronic ([place])   . Depression    moderate  . Fibroids   . 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 HYSTEROSCOPY     2000  . LAPAROSCOPY N/A 06/12/2016   Procedure: LAPAROSCOPY DIAGNOSTIC WITH PERITONEAL BIOSPSY;  Surgeon: Emily Filbert, MD;  Location: [facility];  Service: Gynecology;  Laterality: N/A;  . LAPAROSCOPY N/A 06/12/2016   Procedure: LAPAROSCOPY DIAGNOSTIC, EVACUATION OF HEMATOMA;  Surgeon: Emily Filbert, MD;  Location: [facility];  Service: Gynecology;  Laterality: N/A;    Family History  Problem Relation Age of Onset  . Diabetes Maternal Uncle   . Diabetes Maternal Uncle   . Alcohol abuse Neg Hx   . Arthritis Neg Hx   . Asthma Neg Hx   . Birth defects Neg Hx   . Cancer Neg Hx   . COPD Neg Hx   . Depression Neg Hx   . Drug abuse Neg Hx   . Early death Neg Hx   . Hearing loss Neg Hx   . Heart disease Neg Hx   . Hyperlipidemia Neg Hx   . Hypertension Neg Hx   . Kidney disease Neg Hx   . Learning disabilities Neg Hx   . Mental illness Neg Hx   . Mental retardation Neg Hx   . Miscarriages / Stillbirths Neg Hx   . Stroke Neg Hx   . Vision loss Neg Hx     Social History Social History   Tobacco Use  . Smoking status: Former Smoker    Last attempt to quit: 04/12/2011    Years since quitting: 5.7  . Smokeless tobacco: Never Used  Substance Use Topics  . Alcohol use: No  . Drug use: No    Allergies  Allergen Reactions  . Latex Other (See Comments)    "break out down there" with latex condom use    Current Outpatient Medications  Medication  Sig Dispense Refill  . CHORIOGONADOTROPIN ALFA [place] Inject 35 Units into the skin every Monday, Wednesday, and Friday.    Marland Kitchen ibuprofen (ADVIL,MOTRIN) 600 MG tablet Take 1 tablet (600 mg total) by mouth every 6 (six) hours as needed. 30 tablet 1  . Multiple Vitamins-Minerals (WOMENS MULTI PO) Take 1 tablet by mouth daily.    . Norethindrone-Ethinyl Estradiol-Fe Biphas (LO LOESTRIN FE) 1 MG-10 MCG / 10 MCG tablet Take 1 tablet by mouth daily.    . Elagolix Sodium (ORILISSA) 150 MG TABS Take 1 tablet by mouth daily. 30 tablet 12  . ibuprofen (ADVIL,MOTRIN) 800 MG tablet Take 1 tablet (800 mg total) by mouth every 8 (eight) hours as needed. (Patient not taking: Reported on 10/02/2016) 60 tablet 1  . Levonorgestrel-Ethinyl Estradiol (AMETHIA,CAMRESE) 0.15-0.03 &0.01 MG tablet Take 1 tablet by mouth daily. (Patient not taking: Reported on 10/02/2016) 1 Package 4  . Methylsulfonylmethane (MSM PO) Take 750 m by mouth daily. Has additional Turneric    . norgestrel-ethinyl estradiol (LO/OVRAL,CRYSELLE) 0.3-30 MG-MCG tablet Take 1 tablet by mouth daily. (Patient not taking: Reported  on 12/23/2016) 1 Package 11  . phentermine 37.5 MG capsule Take 37.5 mg by mouth every morning.    . Probiotic Product (PROBIOTIC FORMULA PO) Take 30 mLs by mouth 2 (two) times daily. Liquid    . TURMERIC PO Take 1 tablet by mouth daily.     No current facility-administered medications for this visit.     Review of Systems Review of Systems  Blood pressure 114/63, pulse 66, weight 274 lb (124.3 kg), last menstrual period 11/11/2016.  Physical Exam Physical Exam  Data Reviewed dequacy Reason Satisfactory for evaluation, endocervical/transformation zone component PRESENT. Diagnosis NEGATIVE FOR INTRAEPITHELIAL LESIONS OR MALIGNANCY.  Assessment    Pelvic pain- with endometriosis- She will do a trial of depo provera, understanding that irregular bleeding may still occur.  I am happy to refer her to Dr. [name] but she  does not have regular insurance (has orange card) I will prescribe [name] RTC 3 months She declines a flu vaccine.    Plan           Marvis Bakken C Aja Bolander 12/23/2016, 8:20 AM

## 2017-01-03 ENCOUNTER — Other Ambulatory Visit: Payer: Self-pay

## 2017-01-03 ENCOUNTER — Ambulatory Visit (INDEPENDENT_AMBULATORY_CARE_PROVIDER_SITE_OTHER): Payer: Self-pay | Admitting: Internal Medicine

## 2017-01-03 ENCOUNTER — Encounter: Payer: Self-pay | Admitting: Internal Medicine

## 2017-01-03 VITALS — BP 124/72 | HR 75 | Temp 98.6°F | Ht 65.0 in | Wt 279.4 lb

## 2017-01-03 DIAGNOSIS — N921 Excessive and frequent menstruation with irregular cycle: Secondary | ICD-10-CM

## 2017-01-03 DIAGNOSIS — N939 Abnormal uterine and vaginal bleeding, unspecified: Secondary | ICD-10-CM

## 2017-01-03 LAB — POCT URINE PREGNANCY: PREG TEST UR: NEGATIVE

## 2017-01-03 NOTE — Patient Instructions (Signed)
[name],  Spotting after depo is not uncommon. I will call you if your blood counts look abnormal.  If bleeding becomes heavier, try ibuprofen 800 mg twice daily for a week, which can lighten flow.  Please see Dr. Hulan Fray for changes in medication for endometriosis management.

## 2017-01-03 NOTE — Progress Notes (Signed)
[facility] Progress Note  Subjective:  Carla Hodges is a 36 y.o. female with history of obesity and endometriosis. She presents for vaginal bleeding and clots.   Patient had been on OCPs to help with menometrorrhagia but had brown spotting this fall. She had been on loestrin and switched to lo loestrin at that time but has continued to have "browning" in her underwear and with wiping. She had diagnosis of endometriosis confirmed in June with biopsy and is following with Dr. Hulan Fray. Patient received Depo 12/17 to see if this could stop the bleeding. A few days later she passed a couple clots and noted small amount of blood in her underwear and with wiping. She has not passed any clots today and says light red spotting has lightened up today. It has been 2.5 weeks since she has had sex and would like a pregnancy test. ROS: No fevers, positive for abdominal pain (chronic).  Of note, patient not able to afford orilissa. Patient says she is getting hcg shots a couple times a week as part of weight loss program; has been getting these for about 1 year.    Allergies  Allergen Reactions  . Latex Other (See Comments)    "break out down there" with latex condom use    Social History   Tobacco Use  . Smoking status: Former Smoker    Last attempt to quit: 04/12/2011    Years since quitting: 5.7  . Smokeless tobacco: Never Used  Substance Use Topics  . Alcohol use: No    Objective: Blood pressure 124/72, pulse 75, temperature 98.6 F (37 C), temperature source Oral, height 5\' 5"  (1.651 m), weight 279 lb 6.4 oz (126.7 kg), SpO2 99 %. Body mass index is 46.49 kg/m. Constitutional: obese female in NAD Cardiovascular: RRR, S1, S2, no m/r/g.  Pulmonary/Chest: Effort normal and breath sounds normal.  Abdominal: Soft. +BS, NT GU: Minimal amount of pinkish-clear disharge at os. No blood in vaginal vault. Chaperone present. Psychiatric: Normal mood and affect.  Vitals  reviewed  Assessment/Plan: Vaginal bleeding - Minimal bleeding at os on exam today. Could be symptom of known endometriosis. Could be spotting 2/2 to just starting depo. Upreg negative. - Counseled patient that spotting could continue for several weeks, since she just started depo. - Will heck CBC, as patient has history of blood loss anemia. - To follow-up with Dr. Hulan Fray if no improvement.   Follow-up with Gynecology if no improvement in several weeks.  Olene Floss, MD [place], PGY-3

## 2017-01-04 LAB — CBC
HEMATOCRIT: 35.8 % (ref 34.0–46.6)
HEMOGLOBIN: 11.7 g/dL (ref 11.1–15.9)
MCH: 28.3 pg (ref 26.6–33.0)
MCHC: 32.7 g/dL (ref 31.5–35.7)
MCV: 87 fL (ref 79–97)
Platelets: 396 10*3/uL — ABNORMAL HIGH (ref 150–379)
RBC: 4.13 x10E6/uL (ref 3.77–5.28)
RDW: 16 % — ABNORMAL HIGH (ref 12.3–15.4)
WBC: 5.5 10*3/uL (ref 3.4–10.8)

## 2017-01-06 ENCOUNTER — Encounter: Payer: Self-pay | Admitting: Internal Medicine

## 2017-01-06 DIAGNOSIS — N939 Abnormal uterine and vaginal bleeding, unspecified: Secondary | ICD-10-CM | POA: Insufficient documentation

## 2017-01-06 NOTE — Assessment & Plan Note (Signed)
-   Minimal bleeding at os on exam today. Could be symptom of known endometriosis. Could be spotting 2/2 to just starting depo. Upreg negative. - Counseled patient that spotting could continue for several weeks, since she just started depo. - Will heck CBC, as patient has history of blood loss anemia. - To follow-up with Dr. Hulan Fray if no improvement.

## 2017-01-30 ENCOUNTER — Ambulatory Visit (INDEPENDENT_AMBULATORY_CARE_PROVIDER_SITE_OTHER): Payer: Self-pay | Admitting: Family Medicine

## 2017-01-30 ENCOUNTER — Other Ambulatory Visit: Payer: Self-pay

## 2017-01-30 ENCOUNTER — Encounter: Payer: Self-pay | Admitting: Family Medicine

## 2017-01-30 VITALS — BP 108/64 | HR 80 | Temp 98.5°F | Ht 65.0 in | Wt 286.8 lb

## 2017-01-30 DIAGNOSIS — N898 Other specified noninflammatory disorders of vagina: Secondary | ICD-10-CM

## 2017-01-30 DIAGNOSIS — B373 Candidiasis of vulva and vagina: Secondary | ICD-10-CM

## 2017-01-30 DIAGNOSIS — B3731 Acute candidiasis of vulva and vagina: Secondary | ICD-10-CM

## 2017-01-30 LAB — POCT WET PREP (WET MOUNT)
CLUE CELLS WET PREP WHIFF POC: POSITIVE
Trichomonas Wet Prep HPF POC: ABSENT

## 2017-01-30 MED ORDER — FLUCONAZOLE 150 MG PO TABS: 150.0000 mg | ORAL_TABLET | Freq: Once | ORAL | 0 refills | Status: AC

## 2017-01-30 MED ORDER — METRONIDAZOLE 500 MG PO TABS: 500.0000 mg | ORAL_TABLET | Freq: Two times a day (BID) | ORAL | 0 refills | Status: AC

## 2017-01-30 NOTE — Progress Notes (Signed)
   Subjective:   Patient ID: Carla Hodges    DOB: 13-Mar-1980, 37 y.o. female   MRN: [id number]  CC: Concern for yeast infection  HPI: Carla Hodges is a 37 y.o. female who presents to clinic today for the following issue.  Vaginal itching She states that she has been using oils "down there" and suspects she has a yeast infection.  She is concerned about what else may have "messed up her pH".  She denies using douches or other internal methods of washing.  She does not desire STD testing today.  She does not have a history of STDs in the past.  She denies burning with urination, no urgency or frequency.  She has thick white discharge and endorses that she frequently has yeast infections.  Desires wet prep only today.  ROS: Denies fever, chills, nausea, vomiting, abdominal pain.    Social: Patient is a former smoker Medications reviewed.  Objective:   BP 108/64   Pulse 80   Temp 98.5 F (36.9 C) (Oral)   Ht 5\' 5"  (1.651 m)   Wt 286 lb 13.1 oz (130.1 kg)   LMP 12/27/2016 (Approximate)   SpO2 99%   BMI 47.73 kg/m  Vitals and nursing note reviewed.  General: 37 year old female, NAD  CV: RRR no MRG Lungs: CTAB, nonlabored Pelvic: Normal-appearing external female genitalia, normal cervix, moderate amount of thick white discharge, no bleeding noted, no CMT noted on bimanual exam Skin: warm, dry, no rash Extremities: warm and well perfused   Assessment & Plan:   Yeast vaginitis Symptoms clinically consistent with yeast vaginitis.  No urinary symptoms to suggest a UTI. --Declines STD testing for GC chlamydia, HIV, RPR -Wet prep+ for BV and yeast -Rx provided for Diflucan and Flagyl -Return precautions discussed   Orders Placed This Encounter  Procedures  . POCT Wet Prep [facility])   Meds ordered this encounter  Medications  . fluconazole (DIFLUCAN) 150 MG tablet    Sig: Take 1 tablet (150 mg total) by mouth once for 1 dose.    Dispense:  2 tablet    Refill:  0    . metroNIDAZOLE (FLAGYL) 500 MG tablet    Sig: Take 1 tablet (500 mg total) by mouth 2 (two) times daily for 7 days.    Dispense:  14 tablet    Refill:  0   Follow-up: PRN  Lovenia Kim, MD [place], PGY-2 02/04/2017 3:17 PM

## 2017-01-30 NOTE — Patient Instructions (Signed)
You were seen in clinic for  white discharge which is most likely due to a yeast infection.  We have performed a wet prep to test for infection.  I have sent in a dose of Diflucan to your pharmacy in the event that it may be a yeast infection since you frequently have these.  I will let you know if your wet prep comes back with BV.  I will send in the appropriate antibiotics to your pharmacy if needed.  Clinic if you have any questions.  Be well, Lovenia Kim, MD

## 2017-02-04 NOTE — Assessment & Plan Note (Addendum)
Symptoms clinically consistent with yeast vaginitis.  No urinary symptoms to suggest a UTI. --Declines STD testing for GC chlamydia, HIV, RPR -Wet prep+ for BV and yeast -Rx provided for Diflucan and Flagyl -Return precautions discussed

## 2017-03-12 ENCOUNTER — Encounter (HOSPITAL_COMMUNITY): Payer: Self-pay | Admitting: *Deleted

## 2017-03-12 ENCOUNTER — Inpatient Hospital Stay (HOSPITAL_COMMUNITY)
Admission: AD | Admit: 2017-03-12 | Discharge: 2017-03-12 | Disposition: A | Discharge status: Home / Self Care | Payer: Self-pay | Source: Ambulatory Visit | Attending: Obstetrics & Gynecology | Admitting: Obstetrics & Gynecology

## 2017-03-12 DIAGNOSIS — R42 Dizziness and giddiness: Secondary | ICD-10-CM | POA: Insufficient documentation

## 2017-03-12 DIAGNOSIS — F329 Major depressive disorder, single episode, unspecified: Secondary | ICD-10-CM | POA: Insufficient documentation

## 2017-03-12 DIAGNOSIS — R1030 Lower abdominal pain, unspecified: Secondary | ICD-10-CM | POA: Insufficient documentation

## 2017-03-12 DIAGNOSIS — Z87891 Personal history of nicotine dependence: Secondary | ICD-10-CM | POA: Insufficient documentation

## 2017-03-12 DIAGNOSIS — R102 Pelvic and perineal pain: Secondary | ICD-10-CM | POA: Insufficient documentation

## 2017-03-12 DIAGNOSIS — N939 Abnormal uterine and vaginal bleeding, unspecified: Secondary | ICD-10-CM | POA: Insufficient documentation

## 2017-03-12 LAB — URINALYSIS, ROUTINE W REFLEX MICROSCOPIC
BACTERIA UA: NONE SEEN
BILIRUBIN URINE: NEGATIVE
Glucose, UA: NEGATIVE mg/dL
Ketones, ur: NEGATIVE mg/dL
LEUKOCYTES UA: NEGATIVE
Nitrite: NEGATIVE
PROTEIN: NEGATIVE mg/dL
SPECIFIC GRAVITY, URINE: 1.025 (ref 1.005–1.030)
pH: 7 (ref 5.0–8.0)

## 2017-03-12 LAB — CBC
HEMATOCRIT: 37.4 % (ref 36.0–46.0)
Hemoglobin: 12.2 g/dL (ref 12.0–15.0)
MCH: 29.1 pg (ref 26.0–34.0)
MCHC: 32.6 g/dL (ref 30.0–36.0)
MCV: 89.3 fL (ref 78.0–100.0)
Platelets: 268 10*3/uL (ref 150–400)
RBC: 4.19 MIL/uL (ref 3.87–5.11)
RDW: 16.2 % — AB (ref 11.5–15.5)
WBC: 7.7 10*3/uL (ref 4.0–10.5)

## 2017-03-12 LAB — POCT PREGNANCY, URINE: PREG TEST UR: NEGATIVE

## 2017-03-12 MED ORDER — KETOROLAC TROMETHAMINE 60 MG/2ML IM SOLN
60.0000 mg | Freq: Once | INTRAMUSCULAR | Status: AC
  Administered 2017-03-12: 60 mg via INTRAMUSCULAR
  Filled 2017-03-12: qty 2

## 2017-03-12 MED ORDER — MEGESTROL ACETATE 40 MG PO TABS: 40.0000 mg | ORAL_TABLET | Freq: Two times a day (BID) | ORAL | 0 refills | Status: AC

## 2017-03-12 NOTE — MAU Provider Note (Signed)
History     CSN: [id number]  Arrival date and time: 03/12/17 0539   First Provider Initiated Contact with Patient 03/12/17 1954      Chief Complaint  Patient presents with  . Vaginal Bleeding   HPI Carla Hodges is a 37 y.o. G1P0010 non pregnant female who presents with vaginal bleeding. She states Monday she started having heavy vaginal bleeding that was soaking 4 pads an hour. She states today the bleeding has slowed down but she now feels dizzy. She also reports lower abdominal cramping that she rates a 6/10 and has not tried anything for the pain. She has a hx of endometriosis and is using Depo for birth control.   OB History    Gravida Para Term Preterm AB Living   1       1     SAB TAB Ectopic Multiple Live Births   1              Past Medical History:  Diagnosis Date  . Bronchitis, chronic ([place])   . Depression    moderate  . Fibroids   . 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 HYSTEROSCOPY     2000  . LAPAROSCOPY N/A 06/12/2016   Procedure: LAPAROSCOPY DIAGNOSTIC WITH PERITONEAL BIOSPSY;  Surgeon: Emily Filbert, MD;  Location: [facility];  Service: Gynecology;  Laterality: N/A;  . LAPAROSCOPY N/A 06/12/2016   Procedure: LAPAROSCOPY DIAGNOSTIC, EVACUATION OF HEMATOMA;  Surgeon: Emily Filbert, MD;  Location: [facility];  Service: Gynecology;  Laterality: N/A;    Family History  Problem Relation Age of Onset  . Diabetes Maternal Uncle   . Diabetes Maternal Uncle   . Alcohol abuse Neg Hx   . Arthritis Neg Hx   . Asthma Neg Hx   . Birth defects Neg Hx   . Cancer Neg Hx   . COPD Neg Hx   . Depression Neg Hx   . Drug abuse Neg Hx   . Early death Neg Hx   . Hearing loss Neg Hx   . Heart disease Neg Hx   . Hyperlipidemia Neg Hx   . Hypertension Neg Hx   . Kidney disease Neg Hx   . Learning disabilities Neg Hx   . Mental illness Neg Hx   . Mental retardation Neg Hx   . Miscarriages / Stillbirths Neg Hx   . Stroke Neg  Hx   . Vision loss Neg Hx     Social History   Tobacco Use  . Smoking status: Former Smoker    Last attempt to quit: 04/12/2011    Years since quitting: 5.9  . Smokeless tobacco: Never Used  Substance Use Topics  . Alcohol use: No  . Drug use: No    Allergies:  Allergies  Allergen Reactions  . Latex Other (See Comments)    "break out down there" with latex condom use    Medications Prior to Admission  Medication Sig Dispense Refill Last Dose  . ibuprofen (ADVIL,MOTRIN) 800 MG tablet Take 1 tablet (800 mg total) by mouth every 8 (eight) hours as needed. 60 tablet 1 03/12/2017 at Unknown time  . ibuprofen (ADVIL,MOTRIN) 800 MG tablet Take 1 tablet (800 mg total) by mouth every 8 (eight) hours as needed. 60 tablet 1 03/12/2017 at Unknown time  . Methylsulfonylmethane (MSM PO) Take 750 m by mouth daily. Has additional Turneric   03/12/2017 at Unknown time  .  Multiple Vitamins-Minerals (WOMENS MULTI PO) Take 1 tablet by mouth daily.   03/12/2017 at Unknown time  . Probiotic Product (PROBIOTIC FORMULA PO) Take 30 mLs by mouth 2 (two) times daily. Liquid   03/12/2017 at Unknown time  . TURMERIC PO Take 1 tablet by mouth daily.   03/12/2017 at Unknown time  . CHORIOGONADOTROPIN ALFA [place] Inject 35 Units into the skin every Monday, Wednesday, and Friday.   Unknown at Unknown time  . Norethindrone-Ethinyl Estradiol-Fe Biphas (LO LOESTRIN FE) 1 MG-10 MCG / 10 MCG tablet Take 1 tablet by mouth daily.   Unknown at Unknown time  . phentermine 37.5 MG capsule Take 37.5 mg by mouth every morning.   More than a month at Unknown time    Review of Systems  Constitutional: Negative.  Negative for fatigue and fever.  HENT: Negative.   Respiratory: Negative.  Negative for shortness of breath.   Cardiovascular: Negative.  Negative for chest pain.  Gastrointestinal: Positive for abdominal pain. Negative for constipation, diarrhea, nausea and vomiting.  Genitourinary: Positive for vaginal bleeding. Negative for  dysuria.  Neurological: Negative.  Negative for dizziness and headaches.   Physical Exam   Blood pressure 125/72, pulse 67, temperature 98.5 F (36.9 C), temperature source Oral, resp. rate 19, height 5\' 5"  (1.651 m), weight 292 lb 12.8 oz (132.8 kg), last menstrual period 03/09/2017.  Physical Exam  Nursing note and vitals reviewed. Constitutional: She is oriented to person, place, and time. She appears well-developed and well-nourished. No distress.  HENT:  Head: Normocephalic.  Eyes: Pupils are equal, round, and reactive to light.  Cardiovascular: Normal rate, regular rhythm and normal heart sounds.  Respiratory: Effort normal and breath sounds normal. No respiratory distress.  GI: Soft. Bowel sounds are normal. She exhibits no distension. There is no tenderness.  Genitourinary:  Genitourinary Comments: Small amount of dark red bleeding in vault  Neurological: She is alert and oriented to person, place, and time.  Skin: Skin is warm and dry.  Psychiatric: She has a normal mood and affect. Her behavior is normal. Judgment and thought content normal.    MAU Course  Procedures Results for orders placed or performed during the hospital encounter of 03/12/17 (from the past 24 hour(s))  Urinalysis, Routine w reflex microscopic     Status: Abnormal   Collection Time: 03/12/17  7:25 PM  Result Value Ref Range   Color, Urine YELLOW YELLOW   APPearance CLEAR CLEAR   Specific Gravity, Urine 1.025 1.005 - 1.030   pH 7.0 5.0 - 8.0   Glucose, UA NEGATIVE NEGATIVE mg/dL   Hgb urine dipstick MODERATE (A) NEGATIVE   Bilirubin Urine NEGATIVE NEGATIVE   Ketones, ur NEGATIVE NEGATIVE mg/dL   Protein, ur NEGATIVE NEGATIVE mg/dL   Nitrite NEGATIVE NEGATIVE   Leukocytes, UA NEGATIVE NEGATIVE   RBC / HPF TOO NUMEROUS TO COUNT 0 - 5 RBC/hpf   WBC, UA 0-5 0 - 5 WBC/hpf   Bacteria, UA NONE SEEN NONE SEEN   Squamous Epithelial / LPF 0-5 (A) NONE SEEN   Mucus PRESENT   Pregnancy, urine POC      Status: None   Collection Time: 03/12/17  7:34 PM  Result Value Ref Range   Preg Test, Ur NEGATIVE NEGATIVE  CBC     Status: Abnormal   Collection Time: 03/12/17  8:01 PM  Result Value Ref Range   WBC 7.7 4.0 - 10.5 K/uL   RBC 4.19 3.87 - 5.11 MIL/uL   Hemoglobin 12.2 12.0 -  15.0 g/dL   HCT 37.4 36.0 - 46.0 %   MCV 89.3 78.0 - 100.0 fL   MCH 29.1 26.0 - 34.0 pg   MCHC 32.6 30.0 - 36.0 g/dL   RDW 16.2 (H) 11.5 - 15.5 %   Platelets 268 150 - 400 K/uL   MDM UA, UPT CBC VSS  Assessment and Plan   1. Abnormal uterine bleeding   2. Pelvic pain    -Discharge home in stable condition -Rx for megace 40mg  BID for 14 days -Bleeding precautions discussed -Patient advised to follow-up with [facility] on 3/18 as scheduled  -Patient may return to MAU as needed or if her condition were to change or worsen  Wende Mott CNM 03/12/2017, 8:10 PM

## 2017-03-12 NOTE — MAU Note (Signed)
Pt presents to MAU c/o heavy vaginal bleeding. Pt states she goes thru 4 large pads in 1 hour and has been passing clots. Pt states she is lightheaded. Pt states she is on the depo injection and has stated bleeding on Sunday the 3rd.

## 2017-03-12 NOTE — Discharge Instructions (Signed)
Contraception Choices Contraception, also called birth control, refers to methods or devices that prevent pregnancy. Hormonal methods Contraceptive implant A contraceptive implant is a thin, plastic tube that contains a hormone. It is inserted into the upper part of the arm. It can remain in place for up to 3 years. Progestin-only injections Progestin-only injections are injections of progestin, a synthetic form of the hormone progesterone. They are given every 3 months by a health care provider. Birth control pills Birth control pills are pills that contain hormones that prevent pregnancy. They must be taken once a day, preferably at the same time each day. Birth control patch The birth control patch contains hormones that prevent pregnancy. It is placed on the skin and must be changed once a week for three weeks and removed on the fourth week. A prescription is needed to use this method of contraception. Vaginal ring A vaginal ring contains hormones that prevent pregnancy. It is placed in the vagina for three weeks and removed on the fourth week. After that, the process is repeated with a new ring. A prescription is needed to use this method of contraception. Emergency contraceptive Emergency contraceptives prevent pregnancy after unprotected sex. They come in pill form and can be taken up to 5 days after sex. They work best the sooner they are taken after having sex. Most emergency contraceptives are available without a prescription. This method should not be used as your only form of birth control. Barrier methods Female condom A female condom is a thin sheath that is worn over the penis during sex. Condoms keep sperm from going inside a woman's body. They can be used with a spermicide to increase their effectiveness. They should be disposed after a single use. Female condom A female condom is a soft, loose-fitting sheath that is put into the vagina before sex. The condom keeps sperm from going  inside a woman's body. They should be disposed after a single use. Diaphragm A diaphragm is a soft, dome-shaped barrier. It is inserted into the vagina before sex, along with a spermicide. The diaphragm blocks sperm from entering the uterus, and the spermicide kills sperm. A diaphragm should be left in the vagina for 6-8 hours after sex and removed within 24 hours. A diaphragm is prescribed and fitted by a health care provider. A diaphragm should be replaced every 1-2 years, after giving birth, after gaining more than 15 lb (6.8 kg), and after pelvic surgery. Cervical cap A cervical cap is a round, soft latex or plastic cup that fits over the cervix. It is inserted into the vagina before sex, along with spermicide. It blocks sperm from entering the uterus. The cap should be left in place for 6-8 hours after sex and removed within 48 hours. A cervical cap must be prescribed and fitted by a health care provider. It should be replaced every 2 years. Sponge A sponge is a soft, circular piece of polyurethane foam with spermicide on it. The sponge helps block sperm from entering the uterus, and the spermicide kills sperm. To use it, you make it wet and then insert it into the vagina. It should be inserted before sex, left in for at least 6 hours after sex, and removed and thrown away within 30 hours. Spermicides Spermicides are chemicals that kill or block sperm from entering the cervix and uterus. They can come as a cream, jelly, suppository, foam, or tablet. A spermicide should be inserted into the vagina with an applicator at least 10-27 minutes before  sex to allow time for it to work. The process must be repeated every time you have sex. Spermicides do not require a prescription. Intrauterine contraception Intrauterine device (IUD) An IUD is a T-shaped device that is put in a woman's uterus. There are two types:  Hormone IUD.This type contains progestin, a synthetic form of the hormone progesterone. This  type can stay in place for 3-5 years.  Copper IUD.This type is wrapped in copper wire. It can stay in place for 10 years.  Permanent methods of contraception Female tubal ligation In this method, a woman's fallopian tubes are sealed, tied, or blocked during surgery to prevent eggs from traveling to the uterus. Hysteroscopic sterilization In this method, a small, flexible insert is placed into each fallopian tube. The inserts cause scar tissue to form in the fallopian tubes and block them, so sperm cannot reach an egg. The procedure takes about 3 months to be effective. Another form of birth control must be used during those 3 months. Female sterilization This is a procedure to tie off the tubes that carry sperm (vasectomy). After the procedure, the man can still ejaculate fluid (semen). Natural planning methods Natural family planning In this method, a couple does not have sex on days when the woman could become pregnant. Calendar method This means keeping track of the length of each menstrual cycle, identifying the days when pregnancy can happen, and not having sex on those days. Ovulation method In this method, a couple avoids sex during ovulation. Symptothermal method This method involves not having sex during ovulation. The woman typically checks for ovulation by watching changes in her temperature and in the consistency of cervical mucus. Post-ovulation method In this method, a couple waits to have sex until after ovulation. Summary  Contraception, also called birth control, means methods or devices that prevent pregnancy.  Hormonal methods of contraception include implants, injections, pills, patches, vaginal rings, and emergency contraceptives.  Barrier methods of contraception can include female condoms, female condoms, diaphragms, cervical caps, sponges, and spermicides.  There are two types of IUDs (intrauterine devices). An IUD can be put in a woman's uterus to prevent pregnancy  for 3-5 years.  Permanent sterilization can be done through a procedure for males, females, or both.  Natural family planning methods involve not having sex on days when the woman could become pregnant. This information is not intended to replace advice given to you by your health care provider. Make sure you discuss any questions you have with your health care provider. Document Released: 12/24/2004 Document Revised: 01/27/2016 Document Reviewed: 01/27/2016 Elsevier Interactive Patient Education  2018 [place]. Abnormal Uterine Bleeding Abnormal uterine bleeding means bleeding more than usual from your uterus. It can include:  Bleeding between periods.  Bleeding after sex.  Bleeding that is heavier than normal.  Periods that last longer than usual.  Bleeding after you have stopped having your period (menopause).  There are many problems that may cause this. You should see a doctor for any kind of bleeding that is not normal. Treatment depends on the cause of the bleeding. Follow these instructions at home:  Watch your condition for any changes.  Do not use tampons, douche, or have sex, if your doctor tells you not to.  Change your pads often.  Get regular well-woman exams. Make sure they include a pelvic exam and cervical cancer screening.  Keep all follow-up visits as told by your doctor. This is important. Contact a doctor if:  The bleeding lasts more  than one week.  You feel dizzy at times.  You feel like you are going to throw up (nauseous).  You throw up. Get help right away if:  You pass out.  You have to change pads every hour.  You have belly (abdominal) pain.  You have a fever.  You get sweaty.  You get weak.  You passing large blood clots from your vagina. Summary  Abnormal uterine bleeding means bleeding more than usual from your uterus.  There are many problems that may cause this. You should see a doctor for any kind of bleeding that is  not normal.  Treatment depends on the cause of the bleeding. This information is not intended to replace advice given to you by your health care provider. Make sure you discuss any questions you have with your health care provider. Document Released: 10/21/2008 Document Revised: 12/19/2015 Document Reviewed: 12/19/2015 Elsevier Interactive Patient Education  2017 [employer].

## 2017-03-24 ENCOUNTER — Ambulatory Visit (INDEPENDENT_AMBULATORY_CARE_PROVIDER_SITE_OTHER): Payer: Self-pay | Admitting: Obstetrics and Gynecology

## 2017-03-24 VITALS — BP 141/83 | HR 78 | Ht 65.0 in | Wt 295.8 lb

## 2017-03-24 DIAGNOSIS — Z3042 Encounter for surveillance of injectable contraceptive: Secondary | ICD-10-CM

## 2017-03-24 MED ORDER — MEDROXYPROGESTERONE ACETATE 150 MG/ML IM SUSP
150.0000 mg | Freq: Once | INTRAMUSCULAR | Status: AC
  Administered 2017-03-24: 150 mg via INTRAMUSCULAR

## 2017-03-24 NOTE — Progress Notes (Signed)
Pt came today for Depo shot,gave in left arm.Next depo shot is between 06/03-06/17/19.Pt understood.

## 2017-03-24 NOTE — Progress Notes (Signed)
I have reviewed this chart and agree with the RN/CMA assessment and management.    K. Meryl Ireoluwa Grant, M.D. Attending Obstetrician & Gynecologist, [facility] Practice [facility], [facility]  

## 2017-03-26 ENCOUNTER — Ambulatory Visit: Payer: Self-pay

## 2017-04-04 ENCOUNTER — Telehealth: Payer: Self-pay

## 2017-04-04 MED ORDER — METRONIDAZOLE 500 MG PO TABS: 500.0000 mg | ORAL_TABLET | Freq: Two times a day (BID) | ORAL | 0 refills | Status: DC

## 2017-04-04 MED ORDER — FLUCONAZOLE 150 MG PO TABS: ORAL_TABLET | ORAL | 0 refills | Status: DC

## 2017-04-04 NOTE — Telephone Encounter (Signed)
Pt calling nurse line requesting a rx for BV. She states she knows the policy is to come in for an appointment but she is adamant that she does not need to come in and that she knows the vaginal odor she is experiencing is BV. Requesting I forward message to MD to ask for Rx. Her call back [phone] Wallace Cullens, RN

## 2017-04-04 NOTE — Telephone Encounter (Signed)
Please let patient know I have called in flagyl and diflucan if she develops subsequent yeast infection. She could also try over-the-counter boric acid vaginal suppositories and should try taking a probiotic (also over the counter). If symptoms do not improve, she will need to make an appointment.  Olene Floss, MD [address], PGY-3

## 2017-04-04 NOTE — Telephone Encounter (Signed)
Pt informed. Carla Hodges, Carla Hodges, CMA  

## 2017-05-05 ENCOUNTER — Other Ambulatory Visit: Payer: Self-pay

## 2017-05-05 NOTE — Telephone Encounter (Signed)
Pt requesting refill of metronidazole, states she does not need to come in for an appointment because she knows she has BV that she gets it all the time. Call back [phone] Wallace Cullens, RN

## 2017-05-05 NOTE — Telephone Encounter (Signed)
Called patient and asked her to come in for an appointment. Made appointment with me for 5/2.

## 2017-05-08 ENCOUNTER — Ambulatory Visit (INDEPENDENT_AMBULATORY_CARE_PROVIDER_SITE_OTHER): Payer: Self-pay | Admitting: Internal Medicine

## 2017-05-08 ENCOUNTER — Encounter: Payer: Self-pay | Admitting: Internal Medicine

## 2017-05-08 ENCOUNTER — Other Ambulatory Visit: Payer: Self-pay

## 2017-05-08 VITALS — BP 112/80 | HR 72 | Temp 98.1°F | Ht 65.0 in | Wt 308.0 lb

## 2017-05-08 DIAGNOSIS — N898 Other specified noninflammatory disorders of vagina: Secondary | ICD-10-CM

## 2017-05-08 LAB — POCT WET PREP (WET MOUNT)
Clue Cells Wet Prep Whiff POC: NEGATIVE
TRICHOMONAS WET PREP HPF POC: ABSENT

## 2017-05-08 MED ORDER — METRONIDAZOLE 500 MG PO TABS: 500.0000 mg | ORAL_TABLET | Freq: Two times a day (BID) | ORAL | 0 refills | Status: DC

## 2017-05-08 MED ORDER — FLUCONAZOLE 150 MG PO TABS: ORAL_TABLET | ORAL | 0 refills | Status: DC

## 2017-05-08 NOTE — Patient Instructions (Signed)
Ms. Rooks,  If this test is positive for BV, I will order metronidazole. Please take for full 7 days. You can try over-the-counter boric acid suppositories 3 days in a row followed by 1 weekly for 6 weeks to give this a one-two punch. Yogurt and gentle soaps like dove sensitive may also help.  Best, Dr. Ola Spurr

## 2017-05-11 ENCOUNTER — Encounter: Payer: Self-pay | Admitting: Internal Medicine

## 2017-05-11 NOTE — Assessment & Plan Note (Signed)
-   Patient in contemplative stage of goal setting. Counseled that we could have an appointment just to talk about healthy strategies for weight loss if she would like. Also let her know I could refer her to a nutritionist if she would like.

## 2017-05-11 NOTE — Progress Notes (Signed)
[facility] Progress Note  Subjective:  Carla Hodges is a 37 y.o. female with history of recurrent BV, obesity, and fibroids who presents for concern about vaginal discharge. She is frustrated that she continues to have BV episodes. She noted increased vaginal discharge at the end of March and took a partial course of flagyl. She did not complete the course because symptoms improved. She again noted increased discharge less than a week ago and took a few days of the remaining flagyl. She has a new female partner since her previous episodes. She denies concerns about STDs and declines gc/chlaymdia testing. She notes a "vinegary" smell that she has associated with previous BV outbreaks. She uses fragranced soaps but does not douche. She had tried clindamycin in the past with no improvement. She also tried metrogel with no improvement in the past but did not consistently use. She has never tried boric acid suppositories. She is on depo for birth control. ROS: No fevers, no abdominal pain  Regarding obesity, patient plans to start exercising with the encouragement of her current partner.    Allergies  Allergen Reactions  . Latex Other (See Comments)    "break out down there" with latex condom use    Social History   Tobacco Use  . Smoking status: Former Smoker    Last attempt to quit: 04/12/2011    Years since quitting: 6.0  . Smokeless tobacco: Never Used  Substance Use Topics  . Alcohol use: No    Objective: Blood pressure 112/80, pulse 72, temperature 98.1 F (36.7 C), temperature source Oral, height 5\' 5"  (1.651 m), weight (!) 308 lb (139.7 kg), SpO2 97 %. Body mass index is 51.25 kg/m. Constitutional: Pleasant, well-appearing female in NAD GU: Chaperone present. Mild amount of thin white discharge on speculum exam. No cervical motion TTP.  Skin: Skin is warm and dry. No rash noted.  Psychiatric: Normal mood and affect.  Vitals reviewed  Assessment/Plan: Vaginal  discharge - Patient with negative wet prep for BV but had completed partial treatment with flagyl. - Recommended full course of flagyl followed by boric acid suppositories. - Recommended avoiding use of fragranced soaps when cleaning private parts. Suggested dove sensitive to clean external genitalia.   OBESITY, HX OF - Patient in contemplative stage of goal setting. Counseled that we could have an appointment just to talk about healthy strategies for weight loss if she would like. Also let her know I could refer her to a nutritionist if she would like.   Follow-up prn.  Olene Floss, MD [place], PGY-3

## 2017-05-11 NOTE — Assessment & Plan Note (Signed)
-   Patient with negative wet prep for BV but had completed partial treatment with flagyl. - Recommended full course of flagyl followed by boric acid suppositories. - Recommended avoiding use of fragranced soaps when cleaning private parts. Suggested dove sensitive to clean external genitalia.

## 2017-05-23 ENCOUNTER — Telehealth: Payer: Self-pay | Admitting: Family Medicine

## 2017-05-23 NOTE — Telephone Encounter (Signed)
Patient is requesting a call she has been bleeding for over a week, she want to understand why

## 2017-05-26 NOTE — Telephone Encounter (Signed)
Pt called says we gave her a script to stop bleeding on prior visit. Pt would like that medication again to stop the bleeding. Pt uses [facility]. Please call pt. Please call in meds.

## 2017-05-27 ENCOUNTER — Telehealth: Payer: Self-pay | Admitting: General Practice

## 2017-05-27 NOTE — Telephone Encounter (Signed)
Opened in error

## 2017-05-27 NOTE — Telephone Encounter (Signed)
Patient called back into front office reporting heavy bleeding since Mother's Day. Patient states she came into [facility] back in March and they gave her a prescription to stop the bleeding which worked. Patient states she is out of the medication now and would like a refill. Per chart review, patient saw Dr Hulan Fray in December & was started on Depo. Discussed with Dr Hulan Fray who states patient can come in early for depo injection which will help with bleeding. Patient can also have CBC and needs a follow up appt scheduled with her. Informed patient of recommendations. Patient states she can come tomorrow at 1015. Patient asked why we cannot just refill her medication. Discussed with patient she is continuing to have irregular bleeding since she is new to the depo injections. Discussed she just really needs a sooner injection of Depo which will help stop the bleeding now and will also help to prevent irregular bleeding in the future rather than just refilling the Megace. Patient verbalized understanding & had no other questions.

## 2017-05-28 ENCOUNTER — Ambulatory Visit (INDEPENDENT_AMBULATORY_CARE_PROVIDER_SITE_OTHER): Payer: Self-pay

## 2017-05-28 VITALS — BP 112/82 | HR 67 | Resp 16 | Ht 65.5 in | Wt 309.5 lb

## 2017-05-28 DIAGNOSIS — N809 Endometriosis, unspecified: Secondary | ICD-10-CM

## 2017-05-28 DIAGNOSIS — Z3042 Encounter for surveillance of injectable contraceptive: Secondary | ICD-10-CM

## 2017-05-28 MED ORDER — MEDROXYPROGESTERONE ACETATE 150 MG/ML IM SUSP
150.0000 mg | Freq: Once | INTRAMUSCULAR | Status: AC
  Administered 2017-05-28: 150 mg via INTRAMUSCULAR

## 2017-05-28 NOTE — Progress Notes (Signed)
Patient seen and assessed by nursing staff.  Agree with documentation and plan.  

## 2017-05-28 NOTE — Progress Notes (Signed)
HPI: Patient is here for depo Provera injection. Last: 03/24/17 - Left deltoid. Window: 06/03-06/17. Patient will also have CBC today as well.   Assessment and Plan: Patient tolerated injection - LUOQ well without complication. Patient advised to schedule next Depo Provera injection in 90 days.

## 2017-05-29 LAB — CBC
Hematocrit: 37 % (ref 34.0–46.6)
Hemoglobin: 11.9 g/dL (ref 11.1–15.9)
MCH: 28.6 pg (ref 26.6–33.0)
MCHC: 32.2 g/dL (ref 31.5–35.7)
MCV: 89 fL (ref 79–97)
PLATELETS: 320 10*3/uL (ref 150–450)
RBC: 4.16 x10E6/uL (ref 3.77–5.28)
RDW: 14.1 % (ref 12.3–15.4)
WBC: 6.2 10*3/uL (ref 3.4–10.8)

## 2017-06-09 ENCOUNTER — Ambulatory Visit: Payer: Self-pay

## 2017-06-11 ENCOUNTER — Ambulatory Visit (INDEPENDENT_AMBULATORY_CARE_PROVIDER_SITE_OTHER): Payer: Self-pay | Admitting: Obstetrics & Gynecology

## 2017-06-11 ENCOUNTER — Encounter: Payer: Self-pay | Admitting: Obstetrics & Gynecology

## 2017-06-11 VITALS — BP 135/87 | HR 70 | Resp 16 | Wt 313.2 lb

## 2017-06-11 DIAGNOSIS — N809 Endometriosis, unspecified: Secondary | ICD-10-CM

## 2017-06-11 DIAGNOSIS — N946 Dysmenorrhea, unspecified: Secondary | ICD-10-CM

## 2017-06-11 MED ORDER — MEGESTROL ACETATE 40 MG PO TABS
40.0000 mg | ORAL_TABLET | Freq: Two times a day (BID) | ORAL | 5 refills | Status: DC
Start: 2017-06-11 — End: 2018-05-25

## 2017-06-11 NOTE — Progress Notes (Signed)
   Subjective:    Patient ID: Carla Hodges, female    DOB: 1980-04-26, 37 y.o.   MRN: [id number]  [place] yo single P0 here for followup after a [facility] visit for DUB. She has Stage 1 endometriosis seen on laparoscopy. She has now had her 3rd depo provera shot and the bleeding is almost stopped. She is requesting a prescription for megace in case the heavy bleeding returns. A hbg on 05/28/17 was normal.  Review of Systems Pap smear normal 10/16    Objective:   Physical Exam  Breathing, conversing, and ambulating normally Well nourished, well hydrated Black female, no apparent distress Abd- benign      Assessment & Plan:  DUB and endometriosis- continue depo provera Preventative care- she declines a pap smear today and plans to get one 10/19 I did give her a prescription for megace to be used prn heavy bleeding

## 2017-08-13 ENCOUNTER — Ambulatory Visit (INDEPENDENT_AMBULATORY_CARE_PROVIDER_SITE_OTHER): Payer: Self-pay

## 2017-08-13 DIAGNOSIS — Z3042 Encounter for surveillance of injectable contraceptive: Secondary | ICD-10-CM

## 2017-08-13 MED ORDER — MEDROXYPROGESTERONE ACETATE 150 MG/ML IM SUSP
150.0000 mg | Freq: Once | INTRAMUSCULAR | Status: AC
  Administered 2017-08-13: 150 mg via INTRAMUSCULAR

## 2017-08-13 NOTE — Addendum Note (Signed)
Addended by: Alric Seton on: 08/13/2017 02:20 PM   Modules accepted: Orders

## 2017-08-13 NOTE — Progress Notes (Signed)
Macala A Kam here for Depo-Provera  Injection.  Injection administered without complication. Patient will return in 3 months for next injection.  [place], [place] 08/13/2017  2:17 PM

## 2017-08-14 NOTE — Progress Notes (Signed)
I have reviewed the chart and agree with nursing staff's documentation of this patient's encounter.  Aletha Halim, MD 08/14/2017 9:27 AM

## 2017-10-29 ENCOUNTER — Ambulatory Visit (INDEPENDENT_AMBULATORY_CARE_PROVIDER_SITE_OTHER): Payer: Self-pay | Admitting: *Deleted

## 2017-10-29 VITALS — BP 111/63 | HR 76

## 2017-10-29 DIAGNOSIS — Z3042 Encounter for surveillance of injectable contraceptive: Secondary | ICD-10-CM

## 2017-10-29 MED ORDER — MEDROXYPROGESTERONE ACETATE 150 MG/ML IM SUSP
150.0000 mg | INTRAMUSCULAR | Status: DC
  Administered 2017-10-29 – 2018-09-08 (×3): 150 mg via INTRAMUSCULAR

## 2017-11-03 NOTE — Progress Notes (Signed)
I have reviewed this chart and agree with the RN/CMA assessment and management.    Armour Villanueva C Pasqual Farias, MD, FACOG Attending Physician, Faculty Practice [facility]  

## 2017-11-17 ENCOUNTER — Other Ambulatory Visit: Payer: Self-pay | Admitting: Obstetrics & Gynecology

## 2017-11-21 ENCOUNTER — Telehealth: Payer: Self-pay | Admitting: Family Medicine

## 2017-11-21 DIAGNOSIS — N946 Dysmenorrhea, unspecified: Secondary | ICD-10-CM

## 2017-11-21 NOTE — Telephone Encounter (Signed)
Pt called stating that she has not heard back about her prescription refill for ibuprofen 800. Would like a call back in regards to this matter.

## 2017-11-24 MED ORDER — IBUPROFEN 800 MG PO TABS: 800.0000 mg | ORAL_TABLET | Freq: Three times a day (TID) | ORAL | 1 refills | Status: DC | PRN

## 2017-11-24 NOTE — Addendum Note (Signed)
Addended by: Alric Seton on: 11/24/2017 04:05 PM   Modules accepted: Orders

## 2017-11-24 NOTE — Telephone Encounter (Signed)
Sent 800 Ibuprofen to pharmacy per pt request. Called pt LVM that a Rx was sent to her pharmacy.

## 2017-12-10 ENCOUNTER — Telehealth: Payer: Self-pay | Admitting: Family Medicine

## 2017-12-10 ENCOUNTER — Other Ambulatory Visit: Payer: Self-pay | Admitting: Family Medicine

## 2017-12-10 DIAGNOSIS — H539 Unspecified visual disturbance: Secondary | ICD-10-CM

## 2017-12-10 NOTE — Telephone Encounter (Signed)
Pt is calling and said when she called to set up her eye exam they informed her that she would need an updated referral placed. Pt is seen at [facility] and would like the referral sent to them. She left the phone number for this office. [phone].

## 2017-12-10 NOTE — Telephone Encounter (Signed)
Informed patient of optometry referral.  .Ozella Almond, CMA

## 2017-12-10 NOTE — Telephone Encounter (Signed)
I have provided her with an optometry referral, please inform pt

## 2017-12-22 ENCOUNTER — Telehealth: Payer: Self-pay

## 2017-12-22 NOTE — Telephone Encounter (Signed)
Patient called in requesting that an appointment be made to optometry for her.    After speaking with Kennyth Lose, I was informed that there is a hold on all appointments for orange card holders until the first of the year.  Informed patient.  Carla Hodges, [place]

## 2018-01-12 ENCOUNTER — Telehealth: Payer: Self-pay | Admitting: Family Medicine

## 2018-01-12 NOTE — Telephone Encounter (Signed)
Informed the patient of the hospital being under flu restrictions.

## 2018-01-14 ENCOUNTER — Ambulatory Visit (INDEPENDENT_AMBULATORY_CARE_PROVIDER_SITE_OTHER): Payer: Self-pay | Admitting: General Practice

## 2018-01-14 VITALS — BP 128/81 | HR 82 | Ht 65.0 in | Wt 321.0 lb

## 2018-01-14 DIAGNOSIS — Z3042 Encounter for surveillance of injectable contraceptive: Secondary | ICD-10-CM

## 2018-01-14 NOTE — Progress Notes (Signed)
Carla Hodges here for Depo-Provera  Injection.  Injection administered without complication. Patient will return in 3 months for next injection.  Derinda Late, RN 01/14/2018  1:46 PM

## 2018-01-15 NOTE — Progress Notes (Signed)
I have reviewed this chart and agree with the RN/CMA assessment and management.    Hasina Kreager C Eshal Propps, MD, FACOG Attending Physician, Faculty Practice [facility]  

## 2018-02-04 ENCOUNTER — Other Ambulatory Visit: Payer: Self-pay

## 2018-02-04 ENCOUNTER — Ambulatory Visit (INDEPENDENT_AMBULATORY_CARE_PROVIDER_SITE_OTHER): Payer: Self-pay | Admitting: Family Medicine

## 2018-02-04 ENCOUNTER — Ambulatory Visit
Admission: RE | Admit: 2018-02-04 | Discharge: 2018-02-04 | Disposition: A | Discharge status: Home / Self Care | Payer: Self-pay | Source: Ambulatory Visit | Attending: Family Medicine | Admitting: Family Medicine

## 2018-02-04 VITALS — BP 128/65 | HR 86 | Temp 98.2°F | Wt 263.0 lb

## 2018-02-04 DIAGNOSIS — M25562 Pain in left knee: Principal | ICD-10-CM

## 2018-02-04 DIAGNOSIS — M25561 Pain in right knee: Secondary | ICD-10-CM

## 2018-02-04 DIAGNOSIS — R635 Abnormal weight gain: Secondary | ICD-10-CM

## 2018-02-04 MED ORDER — IBUPROFEN 800 MG PO TABS: 800.0000 mg | ORAL_TABLET | Freq: Three times a day (TID) | ORAL | 2 refills | Status: DC | PRN

## 2018-02-04 NOTE — Assessment & Plan Note (Signed)
Has had knee pain in the past, but this episode seems to be acute.  Agreed with patient that this is likely connected to her recent weight gain, which has been 50 pounds in the last 6 months.  We discussed lifestyle modification in detail, and patient made specific goals, which were included on the AVS.  Will obtain knee x-rays bilaterally.  Also counseled patient to use ice as well as Tylenol and ibuprofen for pain relief.  Cautioned patient against using ibuprofen multiple times a day every day, since this medication can cause renal and GI side effects.  Patient expressed understanding.

## 2018-02-04 NOTE — Assessment & Plan Note (Signed)
Although weight gain is likely due to lifestyle factors, will obtain a TSH to rule out hypothyroidism as a cause since weight gain has been rapid.

## 2018-02-04 NOTE — Patient Instructions (Addendum)
It was nice meeting you today Ms. Carla Hodges!  We are ordering a blood test and x-rays of both of your knees at [facility].  I will call you if your results are abnormal.  Your goal is to make smoothies and eat more vegetables.  You will also cut back your [name] to two per day then eventually to one per week.  Good luck with your goals and we will discuss how it's going in 6 to 8 weeks.  If you have any questions or concerns, please feel free to call the clinic.   Be well,  Dr. Shan Levans

## 2018-02-04 NOTE — Progress Notes (Signed)
   Subjective:    Carla Hodges - 38 y.o. female MRN [id number]  Date of birth: 23-Jun-1980  CC:  Carla Hodges is here for bilateral knee pain.  HPI: Bilateral knee pain - started months ago and is worsening -Developed gradually with no inciting factors noted - says that she has gained a lot of weight recently, and this is making it worse - has not tried ice, heat, or OTC pain relievers  - works on her feet at [employer] - wears comfortable shoes at work that are about one year old - knee pain does not inhibit daily activities but does make a popping noise that makes her worried about working out - has had plantar fasciitis that flared up again about one year ago when she started working at [employer] - knees do not hurt worse at different times of the day  Health Maintenance:  Health Maintenance Due  Topic Date Due  . INFLUENZA VACCINE  08/07/2017  . PAP SMEAR-Modifier  11/03/2017    -  reports that she quit smoking about 6 years ago. She has never used smokeless tobacco. - Review of Systems: Per HPI. - Past Medical History: Patient Active Problem List   Diagnosis Date Noted  . Acute pain of both knees 02/04/2018  . Weight gain 02/04/2018  . Vaginal bleeding 01/06/2017  . Endometriosis determined by laparoscopy 10/02/2016  . Post-operative state 06/12/2016  . 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 Breast nodule 11/06/2015  . Trouble in sleeping 09/30/2015  . Vaginal discharge 05/26/2015  . Yeast vaginitis 12/14/2014  . Healthcare maintenance 09/19/2014  . Heavy menses 09/19/2014  . Pelvic pain 06/04/2013  . BV (bacterial vaginosis) 04/14/2012  . Fibroid 11/26/2011  . Depression 07/28/2006  . GERD 07/28/2006  . OBESITY, HX OF 07/28/2006   - Medications: reviewed and updated   Objective:   Physical Exam BP 128/65   Pulse 86   Temp 98.2 F (36.8 C) (Oral)   Wt 263 lb (119.3 kg)   BMI 43.77 kg/m  Gen: NAD, alert, cooperative with  exam, well-appearing, morbidly obese Musculoskeletal: Mild edema palpated on left knee, no edema in right knee, audible crepitus when patient flexes and extends knees, nontender to palpation, anterior drawer test negative bilaterally, negative McMurray test bilaterally, no gait abnormality, normal range of motion         Assessment & Plan:   Acute pain of both knees Has had knee pain in the past, but this episode seems to be acute.  Agreed with patient that this is likely connected to her recent weight gain, which has been 50 pounds in the last 6 months.  We discussed lifestyle modification in detail, and patient made specific goals, which were included on the AVS.  Will obtain knee x-rays bilaterally.  Also counseled patient to use ice as well as Tylenol and ibuprofen for pain relief.  Cautioned patient against using ibuprofen multiple times a day every day, since this medication can cause renal and GI side effects.  Patient expressed understanding.  Weight gain Although weight gain is likely due to lifestyle factors, will obtain a TSH to rule out hypothyroidism as a cause since weight gain has been rapid.    Maia Breslow, M.D. 02/04/2018, 5:24 PM PGY-2, [place]

## 2018-02-05 LAB — TSH: TSH: 1.8 u[IU]/mL (ref 0.450–4.500)

## 2018-02-10 ENCOUNTER — Encounter: Payer: Self-pay | Admitting: Family Medicine

## 2018-02-19 ENCOUNTER — Ambulatory Visit (INDEPENDENT_AMBULATORY_CARE_PROVIDER_SITE_OTHER): Payer: Self-pay | Admitting: Family Medicine

## 2018-02-19 ENCOUNTER — Encounter: Payer: Self-pay | Admitting: Family Medicine

## 2018-02-19 ENCOUNTER — Other Ambulatory Visit: Payer: Self-pay

## 2018-02-19 VITALS — BP 124/64 | HR 90 | Temp 98.7°F | Wt 332.1 lb

## 2018-02-19 DIAGNOSIS — N898 Other specified noninflammatory disorders of vagina: Secondary | ICD-10-CM

## 2018-02-19 LAB — POCT WET PREP (WET MOUNT)
CLUE CELLS WET PREP WHIFF POC: POSITIVE
Trichomonas Wet Prep HPF POC: ABSENT

## 2018-02-19 MED ORDER — FLUCONAZOLE 200 MG PO TABS: ORAL_TABLET | ORAL | 0 refills | Status: DC

## 2018-02-19 MED ORDER — METRONIDAZOLE 500 MG PO TABS: 500.0000 mg | ORAL_TABLET | Freq: Three times a day (TID) | ORAL | 0 refills | Status: DC

## 2018-02-19 NOTE — Progress Notes (Signed)
   Subjective:   Patient ID: Carla Hodges    DOB: [birth year], 38 y.o. female   MRN: [id number]  CC: vaginal odor  HPI: Carla Hodges is a 38 y.o. female who presents to clinic today for the following issue.  Vaginal odor Has been using a white rag in her underwear and using that to wipe.  She discards this usually every time she uses the bathroom.  Reports her boyfriend gave her oral sex on Wednesday or Thursday and she noticed an odor afterwards.  Minimal vaginal discharge.  Has been using Caress body spray in the area.   She is sexually active with males only and is currently sexually active with 1 partner.   Desires wet prep but no STD testing as she does not think he has given her anything.  Has history of yeast and BV for which she was treated.  No dysuria, frequency or urgency.  No fever, chills, nausea or vomiting.   No pelvic pain or abnormal bleeding.  On Depo Provera for contraception.     ROS: See HPI for pertinent ROS.  Social: pt is a former smoker, quit 2013.  Medications reviewed. Objective:   BP 124/64   Pulse 90   Temp 98.7 F (37.1 C) (Oral)   Wt (!) 332 lb 2 oz (150.7 kg)   SpO2 98%   BMI 55.27 kg/m  Vitals and nursing note reviewed.  General: 38 year old female, NAD Neck: supple  CV: RRR no MRG  Lungs: CTAB, normal effort  Abdomen: soft, NTND, +bs  Pelvic exam: VULVA: normal appearing vulva with no masses, tenderness or lesions, VAGINA: normal appearing vagina with normal color and discharge, no lesions, CERVIX: normal appearing cervix without discharge or lesions. Skin: warm, dry, no rash Extremities: warm and well perfused  Assessment & Plan:   Vaginal odor Discouraged use of a rag in her vaginal area as this can likely be trapping moisture and increasing likelihood of infection.  Declines STD testing at this time. No red flags on exam, pelvic exam is largely normal.   -Wet prep performed today, +clue cells indicative of BV -Rx: Flagyl BID x7  days -GC chlamydia declined Follow up as needed   Orders Placed This Encounter  Procedures  . POCT Wet Prep [facility])   Lovenia Kim, MD [place] PGY-3

## 2018-02-19 NOTE — Patient Instructions (Addendum)
It was nice meeting you today.  You were seen in clinic for vaginal odor and were checked for an infection.  We will call you once the results of this return and send in antibiotics to your pharmacy if needed.  In the meantime, we discussed avoiding using a rag in your vaginal area as this can trap moisture and lead to infections.

## 2018-04-02 ENCOUNTER — Telehealth (INDEPENDENT_AMBULATORY_CARE_PROVIDER_SITE_OTHER): Payer: Self-pay | Admitting: Family Medicine

## 2018-04-02 DIAGNOSIS — B9689 Other specified bacterial agents as the cause of diseases classified elsewhere: Secondary | ICD-10-CM

## 2018-04-02 DIAGNOSIS — N760 Acute vaginitis: Secondary | ICD-10-CM

## 2018-04-02 MED ORDER — METRONIDAZOLE 500 MG PO TABS: 500.0000 mg | ORAL_TABLET | Freq: Three times a day (TID) | ORAL | 0 refills | Status: DC

## 2018-04-02 MED ORDER — FLUCONAZOLE 200 MG PO TABS: ORAL_TABLET | ORAL | 0 refills | Status: DC

## 2018-04-02 NOTE — Assessment & Plan Note (Signed)
Recurrent. Asked not to use douches or anything in vagina.  Finish course of flagyl before takes diflucan if needed.  Call back if not improving

## 2018-04-02 NOTE — Telephone Encounter (Signed)
.  [place] Telemedicine Visit  Patient consented to have visit conducted via telephone.  Encounter participants: Patient: Carla Hodges  Provider: Lind Covert  Others (if applicable): None  Chief Complaint: Vaginal DC  HPI:  Had a recurrence of vaginal discharge that had in Feb.  It went away with the flagyl but came back 2 days ago.  No change in symptoms or pain or fever or concern for STD     Pertinent PMHx: endometriosis  Assessment/Plan:  BV (bacterial vaginosis) Recurrent. Asked not to use douches or anything in vagina.  Finish course of flagyl before takes diflucan if needed.  Call back if not improving     Time spent on phone with patient: 10 minutes

## 2018-04-08 ENCOUNTER — Ambulatory Visit (INDEPENDENT_AMBULATORY_CARE_PROVIDER_SITE_OTHER): Payer: Self-pay

## 2018-04-08 ENCOUNTER — Other Ambulatory Visit: Payer: Self-pay

## 2018-04-08 DIAGNOSIS — Z3042 Encounter for surveillance of injectable contraceptive: Secondary | ICD-10-CM

## 2018-04-08 MED ORDER — MEDROXYPROGESTERONE ACETATE 150 MG/ML IM SUSP
150.0000 mg | Freq: Once | INTRAMUSCULAR | Status: AC
  Administered 2018-04-08: 150 mg via INTRAMUSCULAR

## 2018-04-08 NOTE — Progress Notes (Addendum)
Carla Hodges here for Depo-Provera  Injection.  Injection administered without complication. Patient will return in 3 months for next injection.  Verdell Carmine, RN 04/08/2018  1:45 PM  I have reviewed the documentation and agree with the plan of care.  Earlie Server, RN, MSN, NP-BC Nurse Practitioner, [facility] for [employer], [place] 04/08/2018 5:32 PM

## 2018-05-24 ENCOUNTER — Other Ambulatory Visit: Payer: Self-pay | Admitting: Obstetrics & Gynecology

## 2018-06-23 ENCOUNTER — Ambulatory Visit (INDEPENDENT_AMBULATORY_CARE_PROVIDER_SITE_OTHER): Payer: Self-pay | Admitting: Emergency Medicine

## 2018-06-23 ENCOUNTER — Telehealth: Payer: Self-pay | Admitting: Family Medicine

## 2018-06-23 ENCOUNTER — Other Ambulatory Visit: Payer: Self-pay

## 2018-06-23 DIAGNOSIS — Z3042 Encounter for surveillance of injectable contraceptive: Secondary | ICD-10-CM

## 2018-06-23 MED ORDER — MEDROXYPROGESTERONE ACETATE 150 MG/ML IM SUSP
150.0000 mg | Freq: Once | INTRAMUSCULAR | Status: AC
Start: 2018-06-23 — End: 2018-06-23
  Administered 2018-06-23: 150 mg via INTRAMUSCULAR

## 2018-06-23 NOTE — Progress Notes (Signed)
Chart reviewed for nurse visit. Agree with plan of care.   Wende Mott, [place] 06/23/2018 2:42 PM

## 2018-06-23 NOTE — Progress Notes (Signed)
Carla Hodges here for Depo-Provera  Injection.  Injection administered without complication. Patient will return in 3 months for next injection.  Loma Sousa, RN 06/23/2018  2:26 PM

## 2018-06-23 NOTE — Telephone Encounter (Signed)
Attempted to call patient about her appointment on 6/16 @ 1:30. No answer left detailed voicemail instructing the patient to wear a face mask for the entire appointment and no visitors are allowed. Patient instructed that if she has any symptoms to not come to the appointment and give the office a call to be rescheduled. Office number and list of symptoms were left.

## 2018-06-28 ENCOUNTER — Other Ambulatory Visit: Payer: Self-pay | Admitting: Obstetrics & Gynecology

## 2018-07-28 ENCOUNTER — Other Ambulatory Visit: Payer: Self-pay | Admitting: Obstetrics & Gynecology

## 2018-07-31 ENCOUNTER — Other Ambulatory Visit: Payer: Self-pay | Admitting: Obstetrics & Gynecology

## 2018-07-31 MED ORDER — MEGESTROL ACETATE 40 MG PO TABS: 40.0000 mg | ORAL_TABLET | Freq: Two times a day (BID) | ORAL | 3 refills | Status: DC

## 2018-07-31 NOTE — Progress Notes (Signed)
Megace reordered per patient request.

## 2018-08-13 ENCOUNTER — Other Ambulatory Visit: Payer: Self-pay | Admitting: Family Medicine

## 2018-09-07 ENCOUNTER — Telehealth: Payer: Self-pay | Admitting: Family Medicine

## 2018-09-07 NOTE — Telephone Encounter (Signed)
Spoke to patient about her appointment on 9/1 @ 1:30. Patient instructed to wear a face mask for the entire appointment and no visitors are allowed with her during the visit. Patient screened for covid symptoms and denied having any

## 2018-09-08 ENCOUNTER — Other Ambulatory Visit: Payer: Self-pay

## 2018-09-08 ENCOUNTER — Ambulatory Visit (INDEPENDENT_AMBULATORY_CARE_PROVIDER_SITE_OTHER): Payer: Self-pay | Admitting: General Practice

## 2018-09-08 VITALS — BP 138/89 | HR 90 | Ht 65.0 in | Wt 325.0 lb

## 2018-09-08 DIAGNOSIS — Z3042 Encounter for surveillance of injectable contraceptive: Secondary | ICD-10-CM

## 2018-09-08 NOTE — Progress Notes (Signed)
Patient seen and assessed by nursing staff during this encounter. I have reviewed the chart and agree with the documentation and plan.  Kerry Hough, PA-C 09/08/2018 2:11 PM

## 2018-09-08 NOTE — Progress Notes (Signed)
Carla Hodges here for Depo-Provera  Injection.  Injection administered without complication. Patient will return in 3 months for next injection.  Derinda Late, RN 09/08/2018  1:47 PM

## 2018-09-16 ENCOUNTER — Ambulatory Visit (INDEPENDENT_AMBULATORY_CARE_PROVIDER_SITE_OTHER): Payer: Self-pay | Admitting: Family Medicine

## 2018-09-16 ENCOUNTER — Other Ambulatory Visit: Payer: Self-pay

## 2018-09-16 ENCOUNTER — Telehealth: Payer: Self-pay | Admitting: Family Medicine

## 2018-09-16 ENCOUNTER — Other Ambulatory Visit (HOSPITAL_COMMUNITY)
Admission: RE | Admit: 2018-09-16 | Discharge: 2018-09-16 | Disposition: A | Discharge status: Home / Self Care | Payer: Self-pay | Source: Ambulatory Visit | Attending: Family Medicine | Admitting: Family Medicine

## 2018-09-16 VITALS — BP 126/83 | HR 72 | Temp 98.9°F

## 2018-09-16 DIAGNOSIS — N898 Other specified noninflammatory disorders of vagina: Secondary | ICD-10-CM

## 2018-09-16 DIAGNOSIS — Z0000 Encounter for general adult medical examination without abnormal findings: Secondary | ICD-10-CM

## 2018-09-16 DIAGNOSIS — B3731 Acute candidiasis of vulva and vagina: Secondary | ICD-10-CM

## 2018-09-16 DIAGNOSIS — B373 Candidiasis of vulva and vagina: Secondary | ICD-10-CM

## 2018-09-16 LAB — POCT WET PREP (WET MOUNT)
Clue Cells Wet Prep Whiff POC: NEGATIVE
Trichomonas Wet Prep HPF POC: ABSENT

## 2018-09-16 MED ORDER — FLUCONAZOLE 200 MG PO TABS: ORAL_TABLET | ORAL | 0 refills | Status: DC

## 2018-09-16 NOTE — Telephone Encounter (Signed)
Pt is calling and said she forgot to ask for a referral at her appointment this morning. She would like to know if she can have an emergency  Dental referral.

## 2018-09-16 NOTE — Patient Instructions (Addendum)
It was great meeting you today !I am sorry that you have been having vaginal discharge and itching.  We performed a wet prep which showed yeasts. You can take diflucan 200mg  once. If symptoms are still present, you can take a second dose. We also performed a gonorrhea chlamydia screen this should come back in a couple of days.  Also performed a Pap smear, this result should also come back in a few days.

## 2018-09-17 LAB — CERVICOVAGINAL ANCILLARY ONLY
Chlamydia: NEGATIVE
Neisseria Gonorrhea: NEGATIVE

## 2018-09-21 ENCOUNTER — Other Ambulatory Visit: Payer: Self-pay | Admitting: Family Medicine

## 2018-09-21 ENCOUNTER — Telehealth: Payer: Self-pay | Admitting: Family Medicine

## 2018-09-21 DIAGNOSIS — K0889 Other specified disorders of teeth and supporting structures: Secondary | ICD-10-CM

## 2018-09-21 LAB — CYTOLOGY - PAP
Diagnosis: NEGATIVE
HPV: NOT DETECTED

## 2018-09-21 MED ORDER — AMOXICILLIN 500 MG PO CAPS: 500.0000 mg | ORAL_CAPSULE | Freq: Two times a day (BID) | ORAL | 0 refills | Status: DC

## 2018-09-21 MED ORDER — TRAMADOL HCL 50 MG PO TABS: 50.0000 mg | ORAL_TABLET | Freq: Four times a day (QID) | ORAL | 0 refills | Status: AC | PRN

## 2018-09-21 NOTE — Assessment & Plan Note (Signed)
Pap smear performed

## 2018-09-21 NOTE — Telephone Encounter (Signed)
Please let the patient know that her gc/chlamydia was negative  Guadalupe Dawn MD PGY-3 Family Medicine Resident

## 2018-09-21 NOTE — Telephone Encounter (Signed)
Call patient initially on 9/11 with no answer and called again today, 9/14 to discuss concerns.  Dealing with lower posterior molar pain for the past month that has severely worsened within the last week. Thinks it is broken. Tearful on the phone due to the pain.   Associated gingival swelling, however no fever, erythema.  Patient has orange card and cannot afford out-of-pocket, placed urgent dentist referral.  Sent in 5-day course of amoxicillin and tramadol for breakthrough pain given severity.  Recommended continued use of ibuprofen/Tylenol every 4-6 hours and Orajel OTC.   Patriciaann Clan, DO

## 2018-09-21 NOTE — Telephone Encounter (Signed)
Informed patient of results.  .Michelle R Simpson, CMA  

## 2018-09-21 NOTE — Assessment & Plan Note (Signed)
Yeast seen on wet prep.  Gave Diflucan prescription.  GC chlamydia negative.

## 2018-09-21 NOTE — Progress Notes (Signed)
   HPI 38 year old female who presents for vaginal itching.  She states that she is also been having a white discharge around the same time.  She is interested in also having her Pap smear done at this time.  She has been with the same partner but states that she would also like to have a gonorrhea and chlamydia test done "just to make sure".  Does not think an HIV or RPR is necessary.  CC: Pap smear and vaginal itching   ROS:   Review of Systems See HPI for ROS.   CC, SH/smoking status, and VS noted  Objective: BP 126/83   Pulse 72   Temp 98.9 F (37.2 C)  Gen: Well-appearing 38 year old African-American female, no acute distress, seen comfortably.  Obese. CV: Regular rate and rhythm, no M/R/G Resp: Lungs clear to auscultation bilaterally, no accessory muscle use Neuro: Alert and oriented, Speech clear, No gross deficits GU: Excoriations noted in her vulva.  White discharge noted.  Cervix easily identified.   Assessment and plan:  Yeast vaginitis Positive GC and wet prep.  Gave prescription for Diflucan.  Return precautions given.  Vaginal discharge Yeast seen on wet prep.  Gave Diflucan prescription.  GC chlamydia negative.  Healthcare maintenance Pap smear performed.   Orders Placed This Encounter  Procedures  . POCT Wet Prep [facility])    Meds ordered this encounter  Medications  . fluconazole (DIFLUCAN) 200 MG tablet    Sig: Take 1 tablet today followed by a second tablet in 72 hours if symptoms persist.    Dispense:  2 tablet    Refill:  0     Guadalupe Dawn MD PGY-3 Family Medicine Resident  09/21/2018 11:38 AM

## 2018-09-21 NOTE — Assessment & Plan Note (Signed)
Positive GC and wet prep.  Gave prescription for Diflucan.  Return precautions given.

## 2018-09-24 ENCOUNTER — Telehealth: Payer: Self-pay | Admitting: Family Medicine

## 2018-09-24 NOTE — Telephone Encounter (Signed)
Patient called to see if she could receive a call from Dr. Higinio Plan about her tooth. Please give pt a call.

## 2018-09-25 ENCOUNTER — Other Ambulatory Visit: Payer: Self-pay | Admitting: Family Medicine

## 2018-09-25 DIAGNOSIS — K0889 Other specified disorders of teeth and supporting structures: Secondary | ICD-10-CM

## 2018-09-25 NOTE — Telephone Encounter (Signed)
Pt is calling again and would like to let Dr. Higinio Plan know that she has one amoxicillin left and she still has a small knot on her gum. She would like to discuss this with Dr. Higinio Plan and would also like to know if she could have a refill sent to her pharmacy. Pt is not able to make appointment due to work.  Best call back number is [phone].

## 2018-09-25 NOTE — Telephone Encounter (Signed)
Call patient.  States her dental pain has been improving with amoxicillin, less swelling.  Tramadol was not helpful, however high-dose ibuprofen in the short-term has been effective.  Will send in 2 additional days of amoxicillin with continued inflammation and sensation of "knot" for total 7 day course and will refill ibuprofen.  Let her know referral to dentistry was placed urgently earlier this week, hopeful that she will hear from them by next week.   Patriciaann Clan, DO

## 2018-10-30 ENCOUNTER — Encounter: Payer: Self-pay | Admitting: Obstetrics & Gynecology

## 2018-10-30 ENCOUNTER — Other Ambulatory Visit: Payer: Self-pay

## 2018-10-30 ENCOUNTER — Ambulatory Visit (INDEPENDENT_AMBULATORY_CARE_PROVIDER_SITE_OTHER): Payer: Self-pay | Admitting: Obstetrics & Gynecology

## 2018-10-30 VITALS — BP 134/88 | HR 69 | Wt 327.2 lb

## 2018-10-30 DIAGNOSIS — F439 Reaction to severe stress, unspecified: Secondary | ICD-10-CM

## 2018-10-30 MED ORDER — MISOPROSTOL 200 MCG PO TABS: ORAL_TABLET | ORAL | 0 refills | Status: DC

## 2018-10-30 NOTE — Progress Notes (Addendum)
   Subjective:    Patient ID: Carla Hodges, female    DOB: Feb 09, 1980, 38 y.o.   MRN: [id number]  HPI 38 yo single P0 here to long term depo provera use. She has been on depo provera for about a year. She uses this to manage DUB and pain from endometriosis (seen laparoscopy) as well as for contraception.   Her grandmother died this month and she is very very sad about this. Her GM was essentially her primary caretaker since age 38. She denies suicidal ideation.   Review of Systems Monogamous for about 3 years, occasional dyspareunia due to a size issue She works at the [employer]. Pap is UTD from last month.    Objective:   Physical Exam Breathing, conversing, and ambulating normally Well nourished, well hydrated Black female, no apparent distres     Assessment & Plan:  contraceptiono- currently on depo provera but she is morbidly obese and has depression. This is not a great contraceptive option for her at this point. I have suggested the [place]. She will take cytotec the night prior to IUD insertion. She will schedule an appt with Roselyn Reef as she cannot stay today. She declines a flu vaccine. She will fill out the form for mammogram scholarship.

## 2018-11-11 ENCOUNTER — Encounter: Payer: Self-pay | Admitting: Family Medicine

## 2018-11-11 ENCOUNTER — Other Ambulatory Visit: Payer: Self-pay | Admitting: Family Medicine

## 2018-11-24 ENCOUNTER — Ambulatory Visit: Payer: Self-pay

## 2018-11-25 ENCOUNTER — Ambulatory Visit (INDEPENDENT_AMBULATORY_CARE_PROVIDER_SITE_OTHER): Payer: Self-pay | Admitting: Obstetrics & Gynecology

## 2018-11-25 ENCOUNTER — Other Ambulatory Visit: Payer: Self-pay

## 2018-11-25 ENCOUNTER — Encounter: Payer: Self-pay | Admitting: Obstetrics & Gynecology

## 2018-11-25 VITALS — BP 101/81 | HR 87 | Temp 98.2°F | Ht 65.5 in | Wt 324.4 lb

## 2018-11-25 DIAGNOSIS — F439 Reaction to severe stress, unspecified: Secondary | ICD-10-CM

## 2018-11-25 DIAGNOSIS — Z3202 Encounter for pregnancy test, result negative: Secondary | ICD-10-CM

## 2018-11-25 DIAGNOSIS — Z30430 Encounter for insertion of intrauterine contraceptive device: Secondary | ICD-10-CM

## 2018-11-25 LAB — POCT URINE PREGNANCY: Preg Test, Ur: NEGATIVE

## 2018-11-25 MED ORDER — IBUPROFEN 200 MG PO TABS
800.0000 mg | ORAL_TABLET | Freq: Once | ORAL | Status: AC
  Administered 2018-11-25: 800 mg via ORAL

## 2018-11-25 MED ORDER — LEVONORGESTREL 19.5 MCG/DAY IU IUD
INTRAUTERINE_SYSTEM | Freq: Once | INTRAUTERINE | Status: AC
  Administered 2018-11-25: 52 mg via INTRAUTERINE

## 2018-11-25 NOTE — Progress Notes (Signed)
   Subjective:    Patient ID: Carla Hodges, female    DOB: Aug 09, 1980, 38 y.o.   MRN: [id number]  HPI 38 yo single P0 here for Liletta insertion for management of DUB and pelvic pain from endometriosis.   Review of Systems Pap normal 9/20    Objective:   Physical Exam Breathing, conversing, and ambulating normally Well nourished, well hydrated Black female, no apparent distress UPT negative, consent signed, Time out procedure done. Cervix prepped with betadine and grasped with a single tooth tenaculum. Liletta was easily placed and the strings were cut to 3-4 cm. Uterus sounded to 9 cm. She tolerated the procedure well.     Assessment & Plan:  Contraception- Liletta Stress- rec appt with Margart Sickles check in 4 weeks

## 2018-11-26 LAB — POCT PREGNANCY, URINE: Preg Test, Ur: NEGATIVE

## 2018-12-18 ENCOUNTER — Other Ambulatory Visit (HOSPITAL_COMMUNITY)
Admission: RE | Admit: 2018-12-18 | Discharge: 2018-12-18 | Disposition: A | Discharge status: Home / Self Care | Payer: Self-pay | Source: Ambulatory Visit | Attending: Family Medicine | Admitting: Family Medicine

## 2018-12-18 ENCOUNTER — Ambulatory Visit (INDEPENDENT_AMBULATORY_CARE_PROVIDER_SITE_OTHER): Payer: Self-pay | Admitting: Family Medicine

## 2018-12-18 ENCOUNTER — Other Ambulatory Visit: Payer: Self-pay

## 2018-12-18 VITALS — BP 110/64 | HR 80 | Wt 331.0 lb

## 2018-12-18 DIAGNOSIS — N898 Other specified noninflammatory disorders of vagina: Secondary | ICD-10-CM | POA: Insufficient documentation

## 2018-12-18 LAB — POCT WET PREP (WET MOUNT)
Clue Cells Wet Prep Whiff POC: NEGATIVE
Trichomonas Wet Prep HPF POC: ABSENT

## 2018-12-18 MED ORDER — FLUCONAZOLE 150 MG PO TABS: 150.0000 mg | ORAL_TABLET | Freq: Once | ORAL | 0 refills | Status: AC

## 2018-12-18 NOTE — Patient Instructions (Signed)
It was great to meet you today! Thank you for letting me participate in your care!  Today, we discussed your symptoms and I have already sent Diflucan to the pharmacy. If anything else is abnormal I will call you and let you know what the diagnosis is and what the treatment will be.  Be well, Harolyn Rutherford, DO PGY-3, [facility]

## 2018-12-18 NOTE — Progress Notes (Signed)
Subjective: Chief Complaint  Patient presents with  . Vaginitis    HPI: Carla Hodges is a 38 y.o. presenting to clinic today to discuss the following:  Patient with hx of yeast vaginitis presents today for vaginal discharge and itching. She had some left over Flagyl and took two pills hoping that would bring some relief however it did not. She denies fever, chills, pelvic pain, or rashes. She endorses whitish, clumpy discharge with no odor. She has had only one partner and has had unprotected sex with only this partner. She declines RPR and HIV testing at this time.      ROS noted in HPI.    Social History   Tobacco Use  Smoking Status Former Smoker  . Quit date: 04/12/2011  . Years since quitting: 7.6  Smokeless Tobacco Never Used   Objective: BP 110/64   Pulse 80   Wt (!) 331 lb (150.1 kg)   LMP 11/20/2018 (Exact Date)   SpO2 97%   BMI 54.24 kg/m  Vitals and nursing notes reviewed  Physical Exam Vitals reviewed. Exam conducted with a chaperone present.  Constitutional:      General: She is not in acute distress.    Appearance: Normal appearance. She is not ill-appearing.  Genitourinary:    General: Normal vulva.     Exam position: Supine.     Pubic Area: No rash.      Labia:        Right: No rash, tenderness, lesion or injury.        Left: No rash, tenderness, lesion or injury.      Vagina: Normal. No erythema or bleeding.     Cervix: No discharge, lesion, erythema or cervical bleeding.     Uterus: Normal.      Comments: Strings from IUD seen in cervical OS Skin:    General: Skin is warm and dry.     Findings: No erythema or rash.    Results for orders placed or performed in visit on 12/18/18 (from the past 72 hour(s))  POCT Wet Prep [facility])     Status: None   Collection Time: 12/18/18 10:40 AM  Result Value Ref Range   Source Wet Prep POC VAG    WBC, Wet Prep HPF POC 1-5    Bacteria Wet Prep HPF POC Few Few   Clue Cells Wet Prep HPF POC  None None   Clue Cells Wet Prep Whiff POC Negative Whiff    Yeast Wet Prep HPF POC None None   KOH Wet Prep POC None None   Trichomonas Wet Prep HPF POC Absent Absent    Assessment/Plan:  Vaginal discharge Patient presents with discharge and symptoms consistent with yeast infection. Given symptoms and discharge seen on exam will treat.  - Wet prep negative for BV and Trich, interestingly it was also negative for yeast but I think this is a false negative - GC/CC pending - Diflucan 150mg  tab once, if symptoms do not improve take second pill 3 days later   PATIENT EDUCATION PROVIDED: See AVS    Diagnosis and plan along with any newly prescribed medication(s) were discussed in detail with this patient today. The patient verbalized understanding and agreed with the plan. Patient advised if symptoms worsen return to clinic or ER.    Orders Placed This Encounter  Procedures  . POCT Wet Prep [facility])    Meds ordered this encounter  Medications  . fluconazole (DIFLUCAN) 150 MG tablet  Sig: Take 1 tablet (150 mg total) by mouth once for 1 dose. If symptoms remain after first dose take second pill 3 days later.    Dispense:  2 tablet    Refill:  0     Harolyn Rutherford, DO 12/18/2018, 10:33 AM PGY-3 [place]

## 2018-12-18 NOTE — Assessment & Plan Note (Signed)
Patient presents with discharge and symptoms consistent with yeast infection. Given symptoms and discharge seen on exam will treat.  - Wet prep negative for BV and Trich, interestingly it was also negative for yeast but I think this is a false negative - GC/CC pending - Diflucan 150mg  tab once, if symptoms do not improve take second pill 3 days later

## 2018-12-21 LAB — CERVICOVAGINAL ANCILLARY ONLY
Chlamydia: NEGATIVE
Comment: NEGATIVE
Comment: NORMAL
Neisseria Gonorrhea: NEGATIVE

## 2018-12-25 ENCOUNTER — Ambulatory Visit: Payer: Self-pay | Admitting: Obstetrics & Gynecology

## 2018-12-28 ENCOUNTER — Encounter: Payer: Self-pay | Admitting: Obstetrics & Gynecology

## 2018-12-28 ENCOUNTER — Ambulatory Visit: Payer: Self-pay | Admitting: Obstetrics & Gynecology

## 2019-03-08 ENCOUNTER — Ambulatory Visit (INDEPENDENT_AMBULATORY_CARE_PROVIDER_SITE_OTHER): Payer: Self-pay | Admitting: Family Medicine

## 2019-03-08 ENCOUNTER — Other Ambulatory Visit: Payer: Self-pay

## 2019-03-08 VITALS — BP 112/60 | HR 70 | Wt 341.4 lb

## 2019-03-08 DIAGNOSIS — N898 Other specified noninflammatory disorders of vagina: Secondary | ICD-10-CM

## 2019-03-08 LAB — POCT WET PREP (WET MOUNT)
Clue Cells Wet Prep Whiff POC: POSITIVE
Trichomonas Wet Prep HPF POC: ABSENT

## 2019-03-08 MED ORDER — IBUPROFEN 800 MG PO TABS: 800.0000 mg | ORAL_TABLET | Freq: Three times a day (TID) | ORAL | 0 refills | Status: DC | PRN

## 2019-03-08 MED ORDER — METRONIDAZOLE 500 MG PO TABS: 500.0000 mg | ORAL_TABLET | Freq: Two times a day (BID) | ORAL | 0 refills | Status: AC

## 2019-03-08 MED ORDER — FLUCONAZOLE 150 MG PO TABS: 150.0000 mg | ORAL_TABLET | Freq: Once | ORAL | 0 refills | Status: AC

## 2019-03-08 NOTE — Progress Notes (Signed)
   CHIEF COMPLAINT / HPI:  Vaginal Odor - has been ongoing for 5 days  - Denies itching, burning, abdominal pain, nausea or vomiting. Denies burning with urination, hematuria.  - Denies discharge.  - Patient reports no* STD in the past.  - Sexully active with one female partner.  - LMP was normal for patient.  - Patient reports BV, yeast in the past.    PERTINENT  PMH / PSH: GERD,   OBJECTIVE: BP 112/60   Pulse 70   Wt (!) 341 lb 6 oz (154.8 kg)   SpO2 99%   BMI 55.94 kg/m   General: NAD, pleasant Neck: Supple, no LAD Respiratory: normal work of breathing GU/GYN: External genitalia within normal limits.  Vaginal mucosa pink, moist, normal rugae.  Nonfriable cervix without lesions, no discharge or bleeding noted on speculum exam. Exam performed in the presence of a chaperone.  ASSESSMENT / PLAN:  Vaginal discharge BV confirmed on wet prep. G/C /Chlamydia declined by patient, negative 3 months ago. Symptoms consistent with BV.  - Treatment with Flagyl 500 BID x 7 days. Diflucan x1 s/p metronidazole  - F/U if symptoms not improving or getting worse.  - Self care instructions given - F/U with PCP as needed.  - Return precautions including abdominal pain, fever, chills, nausea, or vomiting given.     [place] Severa Jeremiah, DO PGY-3, [facility]

## 2019-03-08 NOTE — Patient Instructions (Signed)
Thank you for coming to see me today. It was a pleasure! Today we talked about:   I will call you with your lab results and call any necessary medications into your pharmacy as discussed.  Please follow-up as needed.  If you have any questions or concerns, please do not hesitate to call the office at [phone].  Take Care,   [place] Kameron Blethen, DO

## 2019-03-09 ENCOUNTER — Encounter: Payer: Self-pay | Admitting: Family Medicine

## 2019-03-09 NOTE — Assessment & Plan Note (Signed)
BV confirmed on wet prep. G/C /Chlamydia declined by patient, negative 3 months ago. Symptoms consistent with BV.  - Treatment with Flagyl 500 BID x 7 days. Diflucan x1 s/p metronidazole  - F/U if symptoms not improving or getting worse.  - Self care instructions given - F/U with PCP as needed.  - Return precautions including abdominal pain, fever, chills, nausea, or vomiting given.

## 2019-03-31 ENCOUNTER — Encounter: Payer: Self-pay | Admitting: Family Medicine

## 2019-03-31 ENCOUNTER — Other Ambulatory Visit: Payer: Self-pay

## 2019-03-31 ENCOUNTER — Ambulatory Visit (INDEPENDENT_AMBULATORY_CARE_PROVIDER_SITE_OTHER): Payer: Self-pay | Admitting: Family Medicine

## 2019-03-31 VITALS — BP 126/70 | HR 70 | Ht 65.0 in | Wt 341.0 lb

## 2019-03-31 DIAGNOSIS — B3731 Acute candidiasis of vulva and vagina: Secondary | ICD-10-CM

## 2019-03-31 DIAGNOSIS — M79672 Pain in left foot: Secondary | ICD-10-CM

## 2019-03-31 DIAGNOSIS — Z0100 Encounter for examination of eyes and vision without abnormal findings: Secondary | ICD-10-CM

## 2019-03-31 DIAGNOSIS — N898 Other specified noninflammatory disorders of vagina: Secondary | ICD-10-CM

## 2019-03-31 DIAGNOSIS — K0889 Other specified disorders of teeth and supporting structures: Secondary | ICD-10-CM

## 2019-03-31 DIAGNOSIS — B373 Candidiasis of vulva and vagina: Secondary | ICD-10-CM

## 2019-03-31 LAB — POCT WET PREP (WET MOUNT)
Clue Cells Wet Prep Whiff POC: NEGATIVE
Trichomonas Wet Prep HPF POC: ABSENT

## 2019-03-31 MED ORDER — FLUCONAZOLE 150 MG PO TABS: 150.0000 mg | ORAL_TABLET | Freq: Once | ORAL | 0 refills | Status: AC

## 2019-03-31 MED ORDER — IBUPROFEN 800 MG PO TABS: 800.0000 mg | ORAL_TABLET | Freq: Three times a day (TID) | ORAL | 0 refills | Status: DC | PRN

## 2019-03-31 NOTE — Patient Instructions (Addendum)
It was nice meeting you today.   Today we discussed vaginal yeast infection and did a vaginal swab to look for yeast.  Based on your symptoms and discomfort, I have prescribed an antifungal medication to help get rid of the infection.   Please let us know if your symptoms do not resolve or worsen in the next 5 days.   We also discussed your foot pain.  -I am also ordering a foot xray to make sure there is no fracture in your left foot.   I have sent referrals for your foot, eyes and dental pain.   Best,   Dr. Rosita Fire  Vaginal Yeast Infection, Adult  Vaginal yeast infection is a condition that causes vaginal discharge as well as soreness, swelling, and redness (inflammation) of the vagina. This is a common condition. Some women get this infection frequently. What are the causes? This condition is caused by a change in the normal balance of the yeast (candida) and bacteria that live in the vagina. This change causes an overgrowth of yeast, which causes the inflammation. What increases the risk? The condition is more likely to develop in women who:  Take antibiotic medicines.  Have diabetes.  Take birth control pills.  Are pregnant.  Douche often.  Have a weak body defense system (immune system).  Have been taking steroid medicines for a long time.  Frequently wear tight clothing. What are the signs or symptoms? Symptoms of this condition include:  White, thick, creamy vaginal discharge.  Swelling, itching, redness, and irritation of the vagina. The lips of the vagina (vulva) may be affected as well.  Pain or a burning feeling while urinating.  Pain during sex. How is this diagnosed? This condition is diagnosed based on:  Your medical history.  A physical exam.  A pelvic exam. Your health care provider will examine a sample of your vaginal discharge under a microscope. Your health care provider may send this sample for testing to confirm the diagnosis. How is  this treated? This condition is treated with medicine. Medicines may be over-the-counter or prescription. You may be told to use one or more of the following:  Medicine that is taken by mouth (orally).  Medicine that is applied as a cream (topically).  Medicine that is inserted directly into the vagina (suppository). Follow these instructions at home:  Lifestyle  Do not have sex until your health care provider approves. Tell your sex partner that you have a yeast infection. That person should go to his or her health care provider and ask if they should also be treated.  Do not wear tight clothes, such as pantyhose or tight pants.  Wear breathable cotton underwear. General instructions  Take or apply over-the-counter and prescription medicines only as told by your health care provider.  Eat more yogurt. This may help to keep your yeast infection from returning.  Do not use tampons until your health care provider approves.  Try taking a sitz bath to help with discomfort. This is a warm water bath that is taken while you are sitting down. The water should only come up to your hips and should cover your buttocks. Do this 3-4 times per day or as told by your health care provider.  Do not douche.  If you have diabetes, keep your blood sugar levels under control.  Keep all follow-up visits as told by your health care provider. This is important. Contact a health care provider if:  You have a fever.  Your  symptoms go away and then return.  Your symptoms do not get better with treatment.  Your symptoms get worse.  You have new symptoms.  You develop blisters in or around your vagina.  You have blood coming from your vagina and it is not your menstrual period.  You develop pain in your abdomen. Summary  Vaginal yeast infection is a condition that causes discharge as well as soreness, swelling, and redness (inflammation) of the vagina.  This condition is treated with  medicine. Medicines may be over-the-counter or prescription.  Take or apply over-the-counter and prescription medicines only as told by your health care provider.  Do not douche. Do not have sex or use tampons until your health care provider approves.  Contact a health care provider if your symptoms do not get better with treatment or your symptoms go away and then return. This information is not intended to replace advice given to you by your health care provider. Make sure you discuss any questions you have with your health care provider. Document Revised: 07/24/2018 Document Reviewed: 05/12/2017 Elsevier Patient Education  [place].

## 2019-03-31 NOTE — Progress Notes (Signed)
    SUBJECTIVE:   CHIEF COMPLAINT / HPI: vaginal itching /left foot pain   Vaginal Itching  Patient reports that she was recently treated for bacterial vaginosis and completed her 7 day course of antibiotics. She reports that shortly after completely her antibiotics, she had intercourse with her partner and began to have white cottage cheese appearing discharge. She denies vaginal bleeding but does note that she often has AUB due to having endometriosis. She reports this began yesterday and has been very uncomfortable for her. She was given one pill in case she had a yeast infection and despite taking the pill yesterday, she continued to have vaginal pruritis.  She denies seeing any lesions or bumps of her external genitalia.    Foot pain, left This pain has been bothering the patient for a while. She reports that she has been limping because of it and denies any pain  In her heel, forefoot, medial or lateral sides. She reports that she has localized tenderness in the area of tendon of her 5th metatarsal. She denies any recent trauma to the foot or falls. She reports that she is able to move her foot like normal.  She reports previously being referred to a podiatrist but was never contacted for an appointment. She requests another referral since her foot pain has not improved.   Ms. Elsberry requests to have a referral to the eye doctor as she has not been in a while and the office [place], requires her to have a referral to be scheduled.  She also requests a referral to be seen by a dentist. Referrals submitted.   PERTINENT  PMH / PSH:  Previously treated for bacterial vaginosis  OBJECTIVE:   BP 126/70   Pulse 70   Ht 5\' 5"  (1.651 m)   Wt (!) 341 lb (154.7 kg)   BMI 56.75 kg/m    General: obese female who is alert and cooperative and appears to be in no acute distress Cardio: Normal S1 and S2, no S3 or S4. Rhythm is regular. Pulm: Clear to auscultation bilaterally, no crackles, wheezing, or  diminished breath sounds. Normal respiratory effort Abdomen: Bowel sounds normal. Abdomen soft and non-tender.  GU: copious white and beige colored discharged pooled in vaginal vault and oozing from cervical os, no strawberry cervix, no lesions noted on external genital/labia majora or minora, no bleeding  Extremities: left foot has point tenderness on dorsal aspect of her foot in the area of the fifth metatarsal, there is no sign of trauma, bleeding, edema or erythema, she is only tender in that specific spot, there is no ankle tenderness and she has normal ROM of the foot and ankle as well as phalanges  Neuro: patient is alert and oriented x4    ASSESSMENT/PLAN:   Vaginal candidiasis Symptoms and physical exam consistent with vaginal yeast.Patient is not requesting STI testing at this time although one could consider chlamydia given color of vaginal discharge. Patient could also still have BV.  -wet mount  -RX diflucan, 2 pills   Foot pain, left Foot pain on dorsal foot, proximal to 5th metatarsal could be due to fracture, tendonitis, or inflamed nodule.  -left foot xray  -ibuprofen 800mg  refill given for pain    Advised patient to follow up with PCP to further discuss concerns raised for health maintenance items (previously had breast cyst). Patient was agreeable to this plan.   Stark Klein, MD [place]

## 2019-04-01 ENCOUNTER — Telehealth: Payer: Self-pay

## 2019-04-01 DIAGNOSIS — M79672 Pain in left foot: Secondary | ICD-10-CM | POA: Insufficient documentation

## 2019-04-01 NOTE — Telephone Encounter (Signed)
Patient calls nurse line regarding office visit on 3/24. Patient states that she was prescribed 1 diflucan for yeast infection. Patient requests that additional diflucan be sent into pharmacy. Patient also reports new odor and requests medication for BV.   To Dr. Rosita Fire and PCP  Talbot Grumbling, RN

## 2019-04-01 NOTE — Assessment & Plan Note (Signed)
Foot pain on dorsal foot, proximal to 5th metatarsal could be due to fracture, tendonitis, or inflamed nodule.  -left foot xray  -ibuprofen 800mg  refill given for pain

## 2019-04-01 NOTE — Assessment & Plan Note (Signed)
Symptoms and physical exam consistent with vaginal yeast.Patient is not requesting STI testing at this time although one could consider chlamydia given color of vaginal discharge. Patient could also still have BV.  -wet mount  -RX diflucan, 2 pills

## 2019-04-02 ENCOUNTER — Other Ambulatory Visit: Payer: Self-pay | Admitting: Family Medicine

## 2019-04-02 DIAGNOSIS — B9689 Other specified bacterial agents as the cause of diseases classified elsewhere: Secondary | ICD-10-CM

## 2019-04-02 DIAGNOSIS — N760 Acute vaginitis: Secondary | ICD-10-CM

## 2019-04-02 MED ORDER — METRONIDAZOLE 500 MG PO TABS: 500.0000 mg | ORAL_TABLET | Freq: Two times a day (BID) | ORAL | 0 refills | Status: AC

## 2019-04-02 NOTE — Telephone Encounter (Signed)
Pt called stating she is needing antibiotics and stated please dont forget the diflucan with it.

## 2019-04-02 NOTE — Telephone Encounter (Signed)
Called patient to discuss concerns. Reports discharge has thin, more clear, and has fishy odor similar to her previous BV. Sent in flagyl. Pt to call or schedule appt if not improving with this, may need to try alternative therapy in the future.   Carla Clan, DO

## 2019-04-02 NOTE — Telephone Encounter (Signed)
Patient calls nurse line stating she still has discharge, the 1 diflucan did not help, and a new onset of a fishy odor. Patient stated her discharge is thick and white with a fishy smell. Patient requesting flagyl. On top of the #2 diflucan she is requesting, she would like to have another one to have on hand. Please advise.

## 2019-05-05 ENCOUNTER — Ambulatory Visit (INDEPENDENT_AMBULATORY_CARE_PROVIDER_SITE_OTHER): Payer: Self-pay | Admitting: Family Medicine

## 2019-05-05 ENCOUNTER — Encounter: Payer: Self-pay | Admitting: Family Medicine

## 2019-05-05 ENCOUNTER — Other Ambulatory Visit: Payer: Self-pay

## 2019-05-05 VITALS — BP 120/70 | HR 80 | Wt 341.0 lb

## 2019-05-05 DIAGNOSIS — N760 Acute vaginitis: Secondary | ICD-10-CM

## 2019-05-05 DIAGNOSIS — B373 Candidiasis of vulva and vagina: Secondary | ICD-10-CM

## 2019-05-05 DIAGNOSIS — B3731 Acute candidiasis of vulva and vagina: Secondary | ICD-10-CM

## 2019-05-05 DIAGNOSIS — B9689 Other specified bacterial agents as the cause of diseases classified elsewhere: Secondary | ICD-10-CM

## 2019-05-05 DIAGNOSIS — N898 Other specified noninflammatory disorders of vagina: Secondary | ICD-10-CM

## 2019-05-05 LAB — POCT WET PREP (WET MOUNT)
Clue Cells Wet Prep Whiff POC: POSITIVE
Trichomonas Wet Prep HPF POC: ABSENT

## 2019-05-05 MED ORDER — METRONIDAZOLE 500 MG PO TABS
500.0000 mg | ORAL_TABLET | Freq: Two times a day (BID) | ORAL | 0 refills | Status: AC
Start: 2019-05-05 — End: 2019-05-12

## 2019-05-05 NOTE — Progress Notes (Signed)
   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 She is frustrated that she continues to have BV episodes. She noted increased vaginal odor a few days ago and wanted to catch it early. She denies concerns about STDs and declines gc/chlaymdia testing. She notes a "fishy" smell that she has associated with previous BV outbreaks. She uses fragranced soaps but does not douche. She also tried metrogel with no improvement in the past but did not consistently use. She has never tried boric acid suppositories. She has the IUD for contraception. ROS: No fevers, no abdominal pain  PERTINENT  PMH / PSH: recurrent bv  OBJECTIVE:  BP 120/70   Pulse 80   Wt (!) 341 lb (154.7 kg)   BMI 56.75 kg/m   General: NAD, pleasant GU/GYN: External genitalia within normal limits. Vaginal mucosa pink, moist, normal rugae. Nonfriable cervix without lesions, no discharge or bleeding noted on speculum exam.  Exam performed in the presence of a chaperone. Psych: AOx3, appropriate affect  ASSESSMENT/PLAN:   BV (bacterial vaginosis) - Patient with wet prep for + BV. Declined STI testing. IUD for contraception.  - Recommended full course of flagyl followed by boric acid suppositories.  - Recommended avoiding use of fragranced soaps when cleaning private parts. Suggested dove sensitive to clean external genitalia.   [place] Tandre Conly, DO PGY-3, [facility]

## 2019-05-05 NOTE — Patient Instructions (Addendum)
Thank you for coming to see me today. It was a pleasure! Today we talked about:   The wet prep does show that you have bacterial vaginosis at this time.  I have sent in metronidazole for you to take twice daily for total of 7 days.  After you have completed the antibiotics you should try over-the-counter boric acid suppositories 3 days in a row followed by 1 weekly for 6 weeks to give this a one-two punch. Yogurt and gentle soaps like dove sensitive may also help. Please avoid douching.   Please follow-up as needed.  If you have any questions or concerns, please do not hesitate to call the office at [phone].  Take Care,   [place] Oliver Heitzenrater, DO  Bacterial Vaginosis  Bacterial vaginosis is an infection of the vagina. It happens when too many normal germs (healthy bacteria) grow in the vagina. This infection puts you at risk for infections from sex (STIs). Treating this infection can lower your risk for some STIs. You should also treat this if you are pregnant. It can cause your baby to be born early. Follow these instructions at home: Medicines  Take over-the-counter and prescription medicines only as told by your doctor.  Take or use your antibiotic medicine as told by your doctor. Do not stop taking or using it even if you start to feel better. General instructions  If you your sexual partner is a woman, tell her that you have this infection. She needs to get treatment if she has symptoms. If you have a female partner, he does not need to be treated.  During treatment: ? Avoid sex. ? Do not douche. ? Avoid alcohol as told. ? Avoid breastfeeding as told.  Drink enough fluid to keep your pee (urine) clear or pale yellow.  Keep your vagina and butt (rectum) clean. ? Wash the area with warm water every day. ? Wipe from front to back after you use the toilet.  Keep all follow-up visits as told by your doctor. This is important. Preventing this condition  Do not douche.  Use  only warm water to wash around your vagina.  Use protection when you have sex. This includes: ? Latex condoms. ? Dental dams.  Limit how many people you have sex with. It is best to only have sex with the same person (be monogamous).  Get tested for STIs. Have your partner get tested.  Wear underwear that is cotton or lined with cotton.  Avoid tight pants and pantyhose. This is most important in summer.  Do not use any products that have nicotine or tobacco in them. These include cigarettes and e-cigarettes. If you need help quitting, ask your doctor.  Do not use illegal drugs.  Limit how much alcohol you drink. Contact a doctor if:  Your symptoms do not get better, even after you are treated.  You have more discharge or pain when you pee (urinate).  You have a fever.  You have pain in your belly (abdomen).  You have pain with sex.  Your bleed from your vagina between periods. Summary  This infection happens when too many germs (bacteria) grow in the vagina.  Treating this condition can lower your risk for some infections from sex (STIs).  You should also treat this if you are pregnant. It can cause early (premature) birth.  Do not stop taking or using your antibiotic medicine even if you start to feel better. This information is not intended to replace advice given to you  by your health care provider. Make sure you discuss any questions you have with your health care provider. Document Revised: 12/06/2016 Document Reviewed: 09/09/2015 Elsevier Patient Education  2020 [employer].

## 2019-05-06 MED ORDER — FLUCONAZOLE 150 MG PO TABS
150.0000 mg | ORAL_TABLET | Freq: Once | ORAL | 0 refills | Status: AC
Start: 2019-05-06 — End: 2019-05-06

## 2019-05-07 NOTE — Assessment & Plan Note (Signed)
-   Patient with wet prep for + BV. Declined STI testing. IUD for contraception.  - Recommended full course of flagyl followed by boric acid suppositories.  - Recommended avoiding use of fragranced soaps when cleaning private parts. Suggested dove sensitive to clean external genitalia.

## 2019-06-01 ENCOUNTER — Other Ambulatory Visit: Payer: Self-pay

## 2019-06-01 ENCOUNTER — Ambulatory Visit (INDEPENDENT_AMBULATORY_CARE_PROVIDER_SITE_OTHER): Payer: Self-pay | Admitting: Family Medicine

## 2019-06-01 VITALS — BP 120/72 | HR 80 | Ht 65.0 in | Wt 341.0 lb

## 2019-06-01 DIAGNOSIS — N898 Other specified noninflammatory disorders of vagina: Secondary | ICD-10-CM

## 2019-06-01 DIAGNOSIS — M79672 Pain in left foot: Secondary | ICD-10-CM

## 2019-06-01 DIAGNOSIS — M7989 Other specified soft tissue disorders: Secondary | ICD-10-CM

## 2019-06-01 LAB — POCT URINALYSIS DIP (MANUAL ENTRY)
Bilirubin, UA: NEGATIVE
Blood, UA: NEGATIVE
Glucose, UA: NEGATIVE mg/dL
Ketones, POC UA: NEGATIVE mg/dL
Leukocytes, UA: NEGATIVE
Nitrite, UA: NEGATIVE
Protein Ur, POC: NEGATIVE mg/dL
Spec Grav, UA: 1.02 (ref 1.010–1.025)
Urobilinogen, UA: 0.2 E.U./dL
pH, UA: 8.5 — AB (ref 5.0–8.0)

## 2019-06-01 MED ORDER — METRONIDAZOLE 500 MG PO TABS: 500.0000 mg | ORAL_TABLET | Freq: Two times a day (BID) | ORAL | 0 refills | Status: AC

## 2019-06-01 MED ORDER — FLUCONAZOLE 150 MG PO TABS: ORAL_TABLET | ORAL | 0 refills | Status: DC

## 2019-06-01 NOTE — Assessment & Plan Note (Signed)
Patient presenting today with symptoms consistent with BV.  She declines pelvic exam and testing today.  -  Will treat with Flagyl 500 mg twice daily x7 days.  Diflucan s/p metronidazole.   - She is aware of the limitations of treatment given lack of confirmatory testing.  - She is instructed to return if no improvement or worsening symptoms.   - She voiced understanding and agreement with plan. - Return precautions including abdominal pain, fever, chills, nausea, or vomiting given - Follow-up with PCP as needed

## 2019-06-01 NOTE — Patient Instructions (Signed)
Thank you for coming to see me today. It was a pleasure to see you.   For your vaginal odor I have prescribed you metronidazole.  You will need to complete this twice a day for 5 days.  You will need to avoid alcohol while taking this medicine.  If you develop vaginal itching she may use the Diflucan to treat this.  For your swelling in your legs I have obtained some labs to further evaluate.  I also recommend buying over-the-counter compression stockings that are knee-high.  Elevate your legs whenever you can (it will be important for your legs to be above your heart to really see benefit).  If you do not have any improvement please see your PCP for further evaluation.  For your foot pain, I recommend you get Dr. Felicie Morn foot orthotics to wear in your shoes to help with your foot posture.  If you have no improvement or worsening pain please see your PCP we can consider getting x-rays at that time.  If you have any questions or concerns, please do not hesitate to call the office at [phone].  Take Care,   Dr. Mina Marble, DO Resident Physician [place] [phone]

## 2019-06-01 NOTE — Assessment & Plan Note (Signed)
Chronic dorsal lateral left foot pain x 1 year without prior trauma.  Has been evaluated in the past and foot x-ray ordered but patient never had this completed.  Exam notable for point tenderness along fifth metatarsal, otherwise unremarkable.  I believe her bilateral pes planus, morbid obesity and lower extremity swelling are contributing to this chronic pain.  Recommended Dr. Felicie Morn foot orthotics and treating lower extremity swelling as above.  If no improvement would discuss obtaining the x-ray as previously recommended. Could consider referral to sports medicine for further evaluaton as well.  Patient voiced understanding and agreement with plan.

## 2019-06-01 NOTE — Assessment & Plan Note (Addendum)
Chronic.  Symmetric bilateral lower extremity pitting edema.  Symptoms and physical exam are consistent with dependent edema given improvement with elevation.  Risk factors include long periods of standing and morbid obesity.  History and physical exam not consistent with DVT.  UA negative for proteinuria.  Other etiologies considered such as heart failure however given lack of risk factors and symptoms, will defer echo at this time.  - Recommended supportive therapy with knee-high compression stockings.  - Counseled on importance of weight loss.  - Elevate legs above heart as often as possible.   - If unable to obtain significant improvement with over-the-counter compression stockings, can trial prescription strength (recommend 20-22mmHg) -CMP obtained to evaluate for liver, kidney, albumin level -Consider echo if no improvement or worsening, or develops shortness of breath

## 2019-06-01 NOTE — Progress Notes (Signed)
Subjective:   Patient ID: Carla Hodges    DOB: 1980-06-18, 40 y.o. female   MRN: [id number]  Carla Hodges is a 39 y.o. female with a history of GERD, patellofemoral pain syndrome, BV, vaginal candidiasis, depression, fibroids, obesity, here for vaginal odor.  "Vaginal Odor" Patient present for vaginal odor x 1 day, notes that it smells like fish. Denies any vaginal itching, dysuria, hesitancy, hematuria. Sexually active with 1 female partner.  IUD for contraception. Denies STD testing. Declines pelvic exam today. She states "she knows what it is and would just like to be treated for it". Denies any dyspareunia, fevers, chills.   Bilateral LE Swelling: Swelling in both legs x ~1 year. She notes the swelling now involves the lower extremities, ankles, and feet. She notes the swelling is worse when she is on her feet all day. Improves when elevated or by the next morning. Denies claudication but does note some pain when it is very swollen. Denies any treatment. Denies any recent travel, surgeries, clotting disorders. No family history of clotting disorders or blood clots. Denies any SOB, chest pain.  Denies any cardiac or pulmonary history.  Dorsal lateral foot pain x 1 year. Came on suddenly. Denies any trauma. Hurts when there is pressure. Has not improved with anything.   Review of Systems:  Per HPI.   Objective:   BP 120/72   Pulse 80   Ht 5\' 5"  (1.651 m)   Wt (!) 341 lb (154.7 kg)   BMI 56.75 kg/m  Vitals and nursing note reviewed.  General: pleasant middle-age female, sitting comfortably on exam chair, very tearful during exam, well nourished, well developed, non-toxic appearance Resp: Breathing comfortably on room air, speaking in full sentences Skin: warm, dry Extremities: warm and well perfused,1+ pitting edema, symmetric, extending from mid shin to foot bilaterally, no erythema or warmth along skin, negative Homan's sign bilaterally, 2+ pedal and posterior tibial pulses  bilaterally MSK:  gait normal Neuro: Alert and oriented, speech normal Pelvic Exam: Declined  Left Foot: Inspection:  No obvious bony deformity. 1+ pitting edema,  No erythema or bruising. Pes planus Palpation: Tenderness along dorsolateral left foot  ROM: Full  ROM of the ankle. Normal midfoot flexibility Strength: 5/5 strength ankle in all planes Neurovascular: N/V intact distally in the lower extremity Special tests: Negative anterior drawer. NO ligament laxity    Right Foot: Inspection:  No obvious bony deformity. 1+ pitting edema,  No erythema or bruising.  Pes planus Palpation: No tenderness to palpation ROM: Full  ROM of the ankle. Normal midfoot flexibility Strength: 5/5 strength ankle in all planes Neurovascular: N/V intact distally in the lower extremity Special tests: Negative anterior drawer. No ligament laxity  Assessment & Plan:   Vaginal discharge Patient presenting today with symptoms consistent with BV.  She declines pelvic exam and testing today.  -  Will treat with Flagyl 500 mg twice daily x7 days.  Diflucan s/p metronidazole.   - She is aware of the limitations of treatment given lack of confirmatory testing.  - She is instructed to return if no improvement or worsening symptoms.   - She voiced understanding and agreement with plan. - Return precautions including abdominal pain, fever, chills, nausea, or vomiting given - Follow-up with PCP as needed  Swelling of lower extremity Chronic.  Symmetric bilateral lower extremity pitting edema.  Symptoms and physical exam are consistent with dependent edema given improvement with elevation.  Risk factors include long periods of standing and  morbid obesity.  History and physical exam not consistent with DVT.  UA negative for proteinuria.  Other etiologies considered such as heart failure however given lack of risk factors and symptoms, will defer echo at this time.  - Recommended supportive therapy with knee-high  compression stockings.  - Counseled on importance of weight loss.  - Elevate legs above heart as often as possible.   - If unable to obtain significant improvement with over-the-counter compression stockings, can trial prescription strength (recommend 20-93mmHg) -CMP obtained to evaluate for liver, kidney, albumin level -Consider echo if no improvement or worsening, or develops shortness of breath  Foot pain, left Chronic dorsal lateral left foot pain x 1 year without prior trauma.  Has been evaluated in the past and foot x-ray ordered but patient never had this completed.  Exam notable for point tenderness along fifth metatarsal, otherwise unremarkable.  I believe her bilateral pes planus, morbid obesity and lower extremity swelling are contributing to this chronic pain.  Recommended Dr. Felicie Morn foot orthotics and treating lower extremity swelling as above.  If no improvement would discuss obtaining the x-ray as previously recommended. Could consider referral to sports medicine for further evaluaton as well.  Patient voiced understanding and agreement with plan.  Orders Placed This Encounter  Procedures  . Comprehensive metabolic panel  . POCT urinalysis dipstick   Meds ordered this encounter  Medications  . metroNIDAZOLE (FLAGYL) 500 MG tablet    Sig: Take 1 tablet (500 mg total) by mouth 2 (two) times daily for 7 days.    Dispense:  14 tablet    Refill:  0  . fluconazole (DIFLUCAN) 150 MG tablet    Sig: Take 1 tablet now, then another tablet 72 hours later.    Dispense:  2 tablet    Refill:  0    Mina Marble, DO PGY-2, [facility] 06/01/2019 1:23 PM

## 2019-06-02 LAB — COMPREHENSIVE METABOLIC PANEL
ALT: 15 IU/L (ref 0–32)
AST: 16 IU/L (ref 0–40)
Albumin/Globulin Ratio: 1.7 (ref 1.2–2.2)
Albumin: 4 g/dL (ref 3.8–4.8)
Alkaline Phosphatase: 97 IU/L (ref 48–121)
BUN/Creatinine Ratio: 8 — ABNORMAL LOW (ref 9–23)
BUN: 7 mg/dL (ref 6–20)
Bilirubin Total: 0.7 mg/dL (ref 0.0–1.2)
CO2: 23 mmol/L (ref 20–29)
Calcium: 8.7 mg/dL (ref 8.7–10.2)
Chloride: 103 mmol/L (ref 96–106)
Creatinine, Ser: 0.92 mg/dL (ref 0.57–1.00)
GFR calc Af Amer: 91 mL/min/{1.73_m2} (ref >59)
GFR calc non Af Amer: 79 mL/min/{1.73_m2} (ref >59)
Globulin, Total: 2.3 g/dL (ref 1.5–4.5)
Glucose: 89 mg/dL (ref 65–99)
Potassium: 3.8 mmol/L (ref 3.5–5.2)
Sodium: 140 mmol/L (ref 134–144)
Total Protein: 6.3 g/dL (ref 6.0–8.5)

## 2019-06-06 ENCOUNTER — Encounter (HOSPITAL_COMMUNITY): Payer: Self-pay

## 2019-06-06 ENCOUNTER — Other Ambulatory Visit: Payer: Self-pay

## 2019-06-06 ENCOUNTER — Emergency Department (HOSPITAL_COMMUNITY)
Admission: EM | Admit: 2019-06-06 | Discharge: 2019-06-07 | Disposition: A | Discharge status: Home / Self Care | Payer: Self-pay | Attending: Emergency Medicine | Admitting: Emergency Medicine

## 2019-06-06 DIAGNOSIS — R112 Nausea with vomiting, unspecified: Secondary | ICD-10-CM | POA: Insufficient documentation

## 2019-06-06 DIAGNOSIS — R1013 Epigastric pain: Secondary | ICD-10-CM | POA: Insufficient documentation

## 2019-06-06 LAB — CBC WITH DIFFERENTIAL/PLATELET
Abs Immature Granulocytes: 0.06 10*3/uL (ref 0.00–0.07)
Basophils Absolute: 0.1 10*3/uL (ref 0.0–0.1)
Basophils Relative: 1 %
Eosinophils Absolute: 0.1 10*3/uL (ref 0.0–0.5)
Eosinophils Relative: 1 %
HCT: 39.5 % (ref 36.0–46.0)
Hemoglobin: 12.7 g/dL (ref 12.0–15.0)
Immature Granulocytes: 1 %
Lymphocytes Relative: 33 %
Lymphs Abs: 3.1 10*3/uL (ref 0.7–4.0)
MCH: 29.4 pg (ref 26.0–34.0)
MCHC: 32.2 g/dL (ref 30.0–36.0)
MCV: 91.4 fL (ref 80.0–100.0)
Monocytes Absolute: 1.1 10*3/uL — ABNORMAL HIGH (ref 0.1–1.0)
Monocytes Relative: 11 %
Neutro Abs: 5.1 10*3/uL (ref 1.7–7.7)
Neutrophils Relative %: 53 %
Platelets: 272 10*3/uL (ref 150–400)
RBC: 4.32 MIL/uL (ref 3.87–5.11)
RDW: 13.5 % (ref 11.5–15.5)
WBC: 9.5 10*3/uL (ref 4.0–10.5)
nRBC: 0 % (ref 0.0–0.2)

## 2019-06-06 LAB — COMPREHENSIVE METABOLIC PANEL
ALT: 19 U/L (ref 0–44)
AST: 22 U/L (ref 15–41)
Albumin: 3.8 g/dL (ref 3.5–5.0)
Alkaline Phosphatase: 67 U/L (ref 38–126)
Anion gap: 11 (ref 5–15)
BUN: 9 mg/dL (ref 6–20)
CO2: 24 mmol/L (ref 22–32)
Calcium: 8.4 mg/dL — ABNORMAL LOW (ref 8.9–10.3)
Chloride: 104 mmol/L (ref 98–111)
Creatinine, Ser: 0.92 mg/dL (ref 0.44–1.00)
GFR calc Af Amer: >60 mL/min (ref >60)
GFR calc non Af Amer: >60 mL/min (ref >60)
Glucose, Bld: 87 mg/dL (ref 70–99)
Potassium: 3.4 mmol/L — ABNORMAL LOW (ref 3.5–5.1)
Sodium: 139 mmol/L (ref 135–145)
Total Bilirubin: 1.1 mg/dL (ref 0.3–1.2)
Total Protein: 6.7 g/dL (ref 6.5–8.1)

## 2019-06-06 LAB — URINALYSIS, ROUTINE W REFLEX MICROSCOPIC
Glucose, UA: NEGATIVE mg/dL
Ketones, ur: 20 mg/dL — AB
Nitrite: NEGATIVE
Protein, ur: 30 mg/dL — AB
Specific Gravity, Urine: 1.035 — ABNORMAL HIGH (ref 1.005–1.030)
pH: 5 (ref 5.0–8.0)

## 2019-06-06 LAB — LIPASE, BLOOD: Lipase: 20 U/L (ref 11–51)

## 2019-06-06 LAB — I-STAT BETA HCG BLOOD, ED (MC, WL, AP ONLY): I-stat hCG, quantitative: <5 m[IU]/mL (ref <5)

## 2019-06-06 MED ORDER — ONDANSETRON HCL 4 MG/2ML IJ SOLN
4.0000 mg | Freq: Once | INTRAMUSCULAR | Status: AC
  Administered 2019-06-06: 4 mg via INTRAVENOUS
  Filled 2019-06-06: qty 2

## 2019-06-06 MED ORDER — SODIUM CHLORIDE (PF) 0.9 % IJ SOLN
INTRAMUSCULAR | Status: AC
  Administered 2019-06-07: 1 mL
  Filled 2019-06-06: qty 50

## 2019-06-06 MED ORDER — SODIUM CHLORIDE 0.9 % IV BOLUS
1000.0000 mL | Freq: Once | INTRAVENOUS | Status: AC
  Administered 2019-06-06: 1000 mL via INTRAVENOUS

## 2019-06-06 NOTE — ED Provider Notes (Signed)
[facility] DEPT Provider Note   CSN: [id number] Arrival date & time: 06/06/19  2146     History Chief Complaint  Patient presents with  . Emesis    Carla Hodges is a 39 y.o. female.  Patient with history of endometriosis, GERD, obesity presenting with a 4-day history of abdominal pain, nausea and vomiting. States she developed pain in her upper abdomen that is fairly persistent but comes and goes but never goes away completely. She has been taking a friend's Percocet without relief. Pain is associated with multiple episodes of nonbilious, nonbloody emesis daily for the past 4 days. States she only threw up one time today was able to eat mashed potatoes. Feels anorexia and not wanting to eat because of the nausea and pain. Denies diarrhea. Denies pain with urination or blood in the urine. No fever. No vaginal bleeding or discharge. No one has been sick around her no recent travel. No previous abdominal surgeries. States she has had abdominal pain in the past from her endometriosis but never had a vomiting. Denies any chest pain or shortness of breath. No black or bloody stools. No bloody emesis.  The history is provided by the patient.  Emesis Associated symptoms: abdominal pain   Associated symptoms: no arthralgias, no cough, no diarrhea, no fever, no headaches and no myalgias        Past Medical History:  Diagnosis Date  . Breast nodule 11/06/2015  . Bronchitis, chronic ([place])   . Depression    moderate  . Fibroids   . 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 Swelling of lower extremity 06/01/2019  . Foot pain, left 04/01/2019  . Acute pain of both knees 02/04/2018  . Weight gain 02/04/2018  . Endometriosis determined by laparoscopy 10/02/2016  . 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 Vaginal discharge 05/26/2015  . Vaginal candidiasis 12/14/2014  . BV (bacterial  vaginosis) 04/14/2012  . Fibroid 11/26/2011  . Depression 07/28/2006  . GERD 07/28/2006  . OBESITY, HX OF 07/28/2006    Past Surgical History:  Procedure Laterality Date  . HYSTEROSCOPY     2000  . LAPAROSCOPY N/A 06/12/2016   Procedure: LAPAROSCOPY DIAGNOSTIC WITH PERITONEAL BIOSPSY;  Surgeon: Emily Filbert, MD;  Location: [facility];  Service: Gynecology;  Laterality: N/A;  . LAPAROSCOPY N/A 06/12/2016   Procedure: LAPAROSCOPY DIAGNOSTIC, EVACUATION OF HEMATOMA;  Surgeon: Emily Filbert, MD;  Location: [facility];  Service: Gynecology;  Laterality: N/A;     OB History    Gravida  1   Para      Term      Preterm      AB  1   Living        SAB  1   TAB      Ectopic      Multiple      Live Births              Family History  Problem Relation Age of Onset  . Diabetes Maternal Uncle   . Diabetes Maternal Uncle   . Alcohol abuse Neg Hx   . Arthritis Neg Hx   . Asthma Neg Hx   . Birth defects Neg Hx   . Cancer Neg Hx   . COPD Neg Hx   . Depression Neg Hx   . Drug abuse Neg Hx   . Early death Neg Hx   .  Hearing loss Neg Hx   . Heart disease Neg Hx   . Hyperlipidemia Neg Hx   . Hypertension Neg Hx   . Kidney disease Neg Hx   . Learning disabilities Neg Hx   . Mental illness Neg Hx   . Mental retardation Neg Hx   . Miscarriages / Stillbirths Neg Hx   . Stroke Neg Hx   . Vision loss Neg Hx     Social History   Tobacco Use  . Smoking status: Former Smoker    Quit date: 04/12/2011    Years since quitting: 8.1  . Smokeless tobacco: Never Used  Substance Use Topics  . Alcohol use: No  . Drug use: No    Home Medications Prior to Admission medications   Medication Sig Start Date End Date Taking? Authorizing Provider  CHORIOGONADOTROPIN ALFA [place] Inject 35 Units into the skin every Monday, Wednesday, and Friday.    [provider]  Chorionic Gonadotropin (HCG IJ) Inject as directed. Pt reports taking M, W, F; unsure of dosage    [provider]    fluconazole (DIFLUCAN) 150 MG tablet Take 1 tablet now, then another tablet 72 hours later. 06/01/19   Mullis, Kiersten P, DO  ibuprofen (ADVIL) 800 MG tablet Take 1 tablet (800 mg total) by mouth every 8 (eight) hours as needed for mild pain or cramping. 03/31/19   Stark Klein, MD  Methylsulfonylmethane (MSM PO) Take 750 m by mouth daily. Has additional Turneric    [provider]  metroNIDAZOLE (FLAGYL) 500 MG tablet Take 1 tablet (500 mg total) by mouth 2 (two) times daily for 7 days. 06/01/19 06/08/19  Mullis, Kiersten P, DO  Multiple Vitamins-Minerals (WOMENS MULTI PO) Take 1 tablet by mouth daily.    [provider]  Probiotic Product (PROBIOTIC FORMULA PO) Take 30 mLs by mouth 2 (two) times daily. Liquid    [provider]    Allergies    Latex  Review of Systems   Review of Systems  Constitutional: Positive for activity change and appetite change. Negative for fever.  HENT: Negative for congestion and rhinorrhea.   Respiratory: Negative for cough, chest tightness and shortness of breath.   Gastrointestinal: Positive for abdominal pain, nausea and vomiting. Negative for diarrhea.  Genitourinary: Negative for dysuria and hematuria.  Musculoskeletal: Negative for arthralgias and myalgias.  Neurological: Negative for dizziness, weakness and headaches.    all other systems are negative except as noted in the HPI and PMH.   Physical Exam Updated Vital Signs BP (!) 125/96 (BP Location: Right Arm)   Pulse 78   Temp 98.4 F (36.9 C) (Oral)   Resp 18   Ht 5\' 5"  (1.651 m)   Wt (!) 154.7 kg   SpO2 100%   BMI 56.75 kg/m   Physical Exam Vitals and nursing note reviewed.  Constitutional:      General: She is not in acute distress.    Appearance: She is well-developed. She is obese.     Comments: No distress  HENT:     Head: Normocephalic and atraumatic.     Mouth/Throat:     Pharynx: No oropharyngeal exudate.  Eyes:     Conjunctiva/sclera:  Conjunctivae normal.     Pupils: Pupils are equal, round, and reactive to light.  Neck:     Comments: No meningismus. Cardiovascular:     Rate and Rhythm: Normal rate and regular rhythm.     Heart sounds: Normal heart sounds. No murmur.  Pulmonary:  Effort: Pulmonary effort is normal. No respiratory distress.     Breath sounds: Normal breath sounds.  Abdominal:     Palpations: Abdomen is soft.     Tenderness: There is abdominal tenderness. There is no guarding or rebound.     Comments: Exam limited by body habitus. There is epigastric and periumbilical tenderness without guarding or rebound. Abdomen is soft.  Musculoskeletal:        General: No tenderness. Normal range of motion.     Cervical back: Normal range of motion and neck supple.     Comments: No CVA tenderness  Skin:    General: Skin is warm.  Neurological:     Mental Status: She is alert and oriented to person, place, and time.     Cranial Nerves: No cranial nerve deficit.     Motor: No abnormal muscle tone.     Coordination: Coordination normal.     Comments:  5/5 strength throughout. CN 2-12 intact.Equal grip strength.   Psychiatric:        Behavior: Behavior normal.     ED Results / Procedures / Treatments   Labs (all labs ordered are listed, but only abnormal results are displayed) Labs Reviewed  CBC WITH DIFFERENTIAL/PLATELET - Abnormal; Notable for the following components:      Result Value   Monocytes Absolute 1.1 (*)    All other components within normal limits  COMPREHENSIVE METABOLIC PANEL - Abnormal; Notable for the following components:   Potassium 3.4 (*)    Calcium 8.4 (*)    All other components within normal limits  URINALYSIS, ROUTINE W REFLEX MICROSCOPIC - Abnormal; Notable for the following components:   Color, Urine AMBER (*)    Specific Gravity, Urine 1.035 (*)    Hgb urine dipstick SMALL (*)    Bilirubin Urine SMALL (*)    Ketones, ur 20 (*)    Protein, ur 30 (*)    Leukocytes,Ua  SMALL (*)    All other components within normal limits  LIPASE, BLOOD  I-STAT BETA HCG BLOOD, ED ([facility], [facility], [facility] ONLY)    EKG None  Radiology CT ABDOMEN PELVIS W CONTRAST  Result Date: 06/07/2019 CLINICAL DATA:  Epigastric pain, vomiting for 4 days EXAM: CT ABDOMEN AND PELVIS WITH CONTRAST TECHNIQUE: Multidetector CT imaging of the abdomen and pelvis was performed using the standard protocol following bolus administration of intravenous contrast. CONTRAST:  144mL OMNIPAQUE IOHEXOL 300 MG/ML  SOLN COMPARISON:  None. FINDINGS: Lower chest: The visualized heart size within normal limits. No pericardial fluid/thickening. No hiatal hernia. The visualized portions of the lungs are clear. Hepatobiliary: There is low density seen throughout the liver parenchyma with focal fatty sparing seen along the falciform ligament. The main portal vein is patent. No evidence of calcified gallstones, gallbladder wall thickening or biliary dilatation. Pancreas: Unremarkable. No pancreatic ductal dilatation or surrounding inflammatory changes. Spleen: Normal in size without focal abnormality. Adrenals/Urinary Tract: Both adrenal glands appear normal. The kidneys and collecting system appear normal without evidence of urinary tract calculus or hydronephrosis. Bladder is unremarkable. Stomach/Bowel: The stomach, small bowel, and colon are normal in appearance. No inflammatory changes, wall thickening, or obstructive findings.The appendix is normal. Vascular/Lymphatic: There are no enlarged mesenteric, retroperitoneal, or pelvic lymph nodes. No significant vascular findings are present. Reproductive: IUD seen within the superior endometrial canal. The adnexa are unremarkable. Other: Small fat containing anterior umbilical hernia seen. Musculoskeletal: No acute or significant osseous findings. IMPRESSION: No acute intra-abdominal or pelvic pathology to explain the  patient's symptoms. Hepatic steatosis. Electronically Signed   By:  Prudencio Pair M.D.   On: 06/07/2019 00:44    Procedures Procedures (including critical care time)  Medications Ordered in ED Medications  sodium chloride 0.9 % bolus 1,000 mL (has no administration in time range)  ondansetron (ZOFRAN) injection 4 mg (has no administration in time range)    ED Course  I have reviewed the triage vital signs and the nursing notes.  Pertinent labs & imaging results that were available during my care of the patient were reviewed by me and considered in my medical decision making (see chart for details).    MDM Rules/Calculators/A&P                     4 days of upper abdominal pain with nausea and vomiting. No fever. Abdomen is soft without peritoneal signs.  We will check labs, hydrate, treat symptoms  hCG negative.  UA is benign.  LFTs and lipase are normal.  No vomiting throughout ED course.  Patient tolerating p.o.  Abdomen soft without peritoneal signs.  CT scan shows no acute pathology other than hepatic steatosis.  Gallbladder appears normal on CT and she has no right upper quadrant tenderness.  We will treat supportively with antiemetics and begin empiric PPI.  Avoid alcohol, NSAIDs, caffeine, spicy foods. Follow-up with PCP.  Return precautions discussed.  Final Clinical Impression(s) / ED Diagnoses Final diagnoses:  Nausea and vomiting, intractability of vomiting not specified, unspecified vomiting type  Epigastric pain    Rx / DC Orders ED Discharge Orders    None       Aubriana Ravelo, Annie Main, MD 06/07/19 [phone]

## 2019-06-06 NOTE — ED Notes (Signed)
Pt ambulatory to RR 

## 2019-06-06 NOTE — ED Triage Notes (Signed)
Pt sts vomiting x 4 days. Last episode at [facility]. Denies nausea currently.

## 2019-06-07 ENCOUNTER — Encounter (HOSPITAL_COMMUNITY): Payer: Self-pay

## 2019-06-07 ENCOUNTER — Emergency Department (HOSPITAL_COMMUNITY): Payer: Self-pay

## 2019-06-07 ENCOUNTER — Other Ambulatory Visit (HOSPITAL_COMMUNITY): Payer: Self-pay

## 2019-06-07 MED ORDER — ONDANSETRON 4 MG PO TBDP
4.0000 mg | ORAL_TABLET | Freq: Three times a day (TID) | ORAL | 0 refills | Status: DC | PRN
Start: 2019-06-07 — End: 2020-09-26

## 2019-06-07 MED ORDER — ALUM & MAG HYDROXIDE-SIMETH 200-200-20 MG/5ML PO SUSP
30.0000 mL | Freq: Once | ORAL | Status: AC
  Administered 2019-06-07: 30 mL via ORAL
  Filled 2019-06-07: qty 30

## 2019-06-07 MED ORDER — LIDOCAINE VISCOUS HCL 2 % MT SOLN
15.0000 mL | Freq: Once | OROMUCOSAL | Status: AC
  Administered 2019-06-07: 15 mL via ORAL
  Filled 2019-06-07: qty 15

## 2019-06-07 MED ORDER — KETOROLAC TROMETHAMINE 30 MG/ML IJ SOLN
15.0000 mg | Freq: Once | INTRAMUSCULAR | Status: AC
  Administered 2019-06-07: 15 mg via INTRAVENOUS
  Filled 2019-06-07: qty 1

## 2019-06-07 MED ORDER — IOHEXOL 300 MG/ML  SOLN
100.0000 mL | Freq: Once | INTRAMUSCULAR | Status: AC | PRN
  Administered 2019-06-07: 100 mL via INTRAVENOUS

## 2019-06-07 MED ORDER — OMEPRAZOLE 20 MG PO CPDR
20.0000 mg | DELAYED_RELEASE_CAPSULE | Freq: Every day | ORAL | 0 refills | Status: DC
Start: 2019-06-07 — End: 2019-08-13

## 2019-06-07 NOTE — Discharge Instructions (Signed)
Take the nausea and stomach medication as prescribed.  Avoid alcohol, caffeine, spicy foods, NSAID medications. follow-up with your doctor.  Return to the ED with new or worsening symptoms.

## 2019-06-11 ENCOUNTER — Telehealth: Payer: Self-pay | Admitting: *Deleted

## 2019-06-11 NOTE — Telephone Encounter (Signed)
Patient called front desk wanting to make appointment for nausea/ vomiting/ acid reflux. Call transferred from registrar to nurse to evaulate if appointment should be made.  Kymoni reports she has history of endometriosis, and lately has been having nausea, vomiting , acid reflux and pain in her upper and mid abdomen, not pelvic.  I discussed with her that it does not sound likely that this is gynecological in nature and I recommend she call Family Medicine which is her PCP to see if they want to see her of refer to GI. I explained that nausea/ vomiting/ reflux is not our specialty and we are not the best to meet her needs. She voices understanding. Jacaria Colburn,RN

## 2019-07-13 ENCOUNTER — Telehealth: Payer: Self-pay | Admitting: Family Medicine

## 2019-07-13 NOTE — Telephone Encounter (Signed)
Patient called to schedule an appointment for her bv. She said she would like for Dr. Higinio Plan to send in a prescription for this because she knows it is bv flare up.   I informed her she would most likely need to be evaluated in person before any medications are prescribed. She agreed to appointment but says she has to work and would rather not come into the office.   If Dr. Higinio Plan is able to send medication to pharmacy please contact patient.

## 2019-07-14 ENCOUNTER — Other Ambulatory Visit: Payer: Self-pay | Admitting: Family Medicine

## 2019-07-14 DIAGNOSIS — N760 Acute vaginitis: Secondary | ICD-10-CM

## 2019-07-14 MED ORDER — METRONIDAZOLE 500 MG PO TABS: 500.0000 mg | ORAL_TABLET | Freq: Two times a day (BID) | ORAL | 0 refills | Status: AC

## 2019-07-14 MED ORDER — FLUCONAZOLE 150 MG PO TABS
150.0000 mg | ORAL_TABLET | Freq: Once | ORAL | 0 refills | Status: AC
Start: 2019-07-14 — End: 2019-07-14

## 2019-07-14 NOTE — Telephone Encounter (Signed)
Done, thank you.   Patriciaann Clan, DO

## 2019-07-14 NOTE — Telephone Encounter (Signed)
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  However she would like for an RX for yeast be sent in as well as she normally develops a yeast infection afterwards.  Please send in two pills because she states that one does not always work for her.  Carla Hodges, [place]

## 2019-07-14 NOTE — Telephone Encounter (Signed)
Please call patient and let her know I have sent in metronidazole for 7 days.  She has had several documented episodes of BV and can cancel her appointment for tomorrow.  Patriciaann Clan, DO

## 2019-07-15 ENCOUNTER — Ambulatory Visit: Payer: Self-pay | Admitting: Family Medicine

## 2019-07-30 ENCOUNTER — Other Ambulatory Visit: Payer: Self-pay

## 2019-07-30 ENCOUNTER — Ambulatory Visit (INDEPENDENT_AMBULATORY_CARE_PROVIDER_SITE_OTHER): Payer: Self-pay | Admitting: Family Medicine

## 2019-07-30 VITALS — BP 122/70 | HR 74 | Ht 65.0 in | Wt 322.0 lb

## 2019-07-30 DIAGNOSIS — B3731 Acute candidiasis of vulva and vagina: Secondary | ICD-10-CM

## 2019-07-30 DIAGNOSIS — B373 Candidiasis of vulva and vagina: Secondary | ICD-10-CM

## 2019-07-30 DIAGNOSIS — B9689 Other specified bacterial agents as the cause of diseases classified elsewhere: Secondary | ICD-10-CM

## 2019-07-30 DIAGNOSIS — N898 Other specified noninflammatory disorders of vagina: Secondary | ICD-10-CM

## 2019-07-30 DIAGNOSIS — N760 Acute vaginitis: Secondary | ICD-10-CM

## 2019-07-30 LAB — POCT WET PREP (WET MOUNT)
Clue Cells Wet Prep Whiff POC: POSITIVE
Trichomonas Wet Prep HPF POC: ABSENT

## 2019-07-30 MED ORDER — FLUCONAZOLE 150 MG PO TABS: ORAL_TABLET | ORAL | 0 refills | Status: DC

## 2019-07-30 MED ORDER — METRONIDAZOLE 500 MG PO TABS
500.0000 mg | ORAL_TABLET | Freq: Three times a day (TID) | ORAL | 0 refills | Status: DC
Start: 2019-07-30 — End: 2019-08-14

## 2019-07-30 MED ORDER — CLINDESSE 2 % VA CREA: 1.0000 | TOPICAL_CREAM | Freq: Once | VAGINAL | 0 refills | Status: AC

## 2019-07-30 NOTE — Assessment & Plan Note (Addendum)
Rx Clindesse vaginal gel for one time application, however, Patient requests metronidazole as she is more comfortable with this regimen  - Patient will continue boric acid treatments for 30 days and verbalized understanding of safe storage and avoiding any oral consumption of this medication

## 2019-07-30 NOTE — Progress Notes (Signed)
    SUBJECTIVE:   CHIEF COMPLAINT / HPI: vaginal odor   Patient reports she has been having vaginal odor for 2 days. Patient reports having sex two days ago and noticing the vaginal odor since later that evening. She denies any vaginal discharge or AUB bleeding. Patient reports that she does have diagnosis of endometriosis and frequently has been positive for BV. Menses are irregular. Patient has IUD. Reports intermittent condom use.Denies any new sexual partners and is not concerned for STI. Patient reports she has attempted to start boric acid as preventative measure for BV. Advised patient to avoid sexual intercourse during this treatment for 30 days as sexual intercourse often alters vaginal flora and puts her at risk for another episode of BV.   PERTINENT  PMH / PSH: recurrent BV   OBJECTIVE:   BP 122/70   Pulse 74   Ht 5\' 5"  (1.651 m)   Wt (!) 322 lb (146.1 kg)   SpO2 97%   BMI 53.58 kg/m    General: female appearing stated age in no acute distress Cardio: Normal S1 and S2, no S3 or S4. Rhythm is regular. No murmurs or rubs.  Bilateral radial pulses palpable Pulm: Clear to auscultation bilaterally, no crackles, wheezing, or diminished breath sounds. Normal respiratory effort Abdomen: Bowel sounds normal. Abdomen soft and non-tender GU: cervix does not appear inflamed, no pooling of blood in vaginal vault, normal external genitalia, scant normal vaginal discharge  ASSESSMENT/PLAN:   BV (bacterial vaginosis) Rx Clindesse vaginal gel for one time application, however, Patient requests metronidazole as she is more comfortable with this regimen  - Patient will continue boric acid treatments for 30 days and verbalized understanding of safe storage and avoiding any oral consumption of this medication    Vaginal candidiasis Wet prep positive for yeast  - prescribed diflucan, multiple tablets as patient commonly reports yeast infection following abx regimen for BV     Eulis Foster, MD [place]

## 2019-07-30 NOTE — Patient Instructions (Addendum)
I have prescribed a one-time dose of vaginal medication. Another option.  We can consider future use boric acid but will require you to insert the medication vaginally for 30 nights consecutively.  This medication is highly toxic and cannot be consumed orally would like to allow you time to consider whether or not you would like to try this.  I have also prescribed Diflucan as you have a yeast infection in addition to bacterial vaginosis.  Boric Acid vaginal suppository What is this medicine? BORIC ACID (BOHR ik AS id) helps to promote the proper acid balance in the vagina. It is used to help treat yeast infections of the vagina and relieve symptoms such as itching and burning. This medicine may be used for other purposes; ask your health care provider or pharmacist if you have questions. COMMON BRAND NAME(S): Hylafem What should I tell my health care provider before I take this medicine? They need to know if you have any of these conditions:  diabetes  frequent infections  HIV or AIDS  immune system problems  an unusual or allergic reaction to boric acid, other medicines, foods, dyes, or preservatives  pregnant or trying to get pregnant  breast-feeding How should I use this medicine? This medicine is for use in the vagina. Do not take by mouth. Follow the directions on the prescription label. Read package directions carefully before using. Wash hands before and after use. Use this medicine at bedtime, unless otherwise directed by your doctor. Do not use your medicine more often than directed. Do not stop using this medicine except on your doctor's advice. Talk to your pediatrician regarding the use of this medicine in children. This medicine is not approved for use in children. Overdosage: If you think you have taken too much of this medicine contact a poison control center or emergency room at once. NOTE: This medicine is only for you. Do not share this medicine with others. What  if I miss a dose? If you miss a dose, use it as soon as you can. If it is almost time for your next dose, use only that dose. Do not use double or extra doses. What may interact with this medicine? Interactions are not expected. Do not use any other vaginal products without telling your doctor or health care professional. This list may not describe all possible interactions. Give your health care provider a list of all the medicines, herbs, non-prescription drugs, or dietary supplements you use. Also tell them if you smoke, drink alcohol, or use illegal drugs. Some items may interact with your medicine. What should I watch for while using this medicine? Tell your doctor or health care professional if your symptoms do not start to get better within a few days. It is better not to have sex until you have finished your treatment. This medicine may damage condoms or diaphragms and cause them not to work properly. It may also decrease the effect of vaginal spermicides. Do not rely on any of these methods to prevent sexually transmitted diseases or pregnancy while you are using this medicine. Vaginal medicines usually will come out of the vagina during treatment. To keep the medicine from getting on your clothing, wear a panty liner. The use of tampons is not recommended. To help clear up the infection, wear freshly washed cotton, not synthetic, underwear. What side effects may I notice from receiving this medicine? Side effects that you should report to your doctor or health care professional as soon as possible:  allergic  reactions like skin rash, itching or hives  vaginal irritation, redness, or burning Side effects that usually do not require medical attention (report to your doctor or health care professional if they continue or are bothersome):  vaginal discharge This list may not describe all possible side effects. Call your doctor for medical advice about side effects. You may report side effects  to FDA at 1-800-FDA-1088. Where should I keep my medicine? Keep out of the reach of children. Store in a cool, dry place between 15 and 30 degrees C (59 and 86 degrees F). Keep away from sunlight. Throw away any unused medicine after the expiration date. NOTE: This sheet is a summary. It may not cover all possible information. If you have questions about this medicine, talk to your doctor, pharmacist, or health care provider.  2020 Elsevier/Gold Standard (2015-01-26 07:29:58)  Bacterial Vaginosis  Bacterial vaginosis is a vaginal infection that occurs when the normal balance of bacteria in the vagina is disrupted. It results from an overgrowth of certain bacteria. This is the most common vaginal infection among women ages 29-44. Because bacterial vaginosis increases your risk for STIs (sexually transmitted infections), getting treated can help reduce your risk for chlamydia, gonorrhea, herpes, and HIV (human immunodeficiency virus). Treatment is also important for preventing complications in pregnant women, because this condition can cause an early (premature) delivery. What are the causes? This condition is caused by an increase in harmful bacteria that are normally present in small amounts in the vagina. However, the reason that the condition develops is not fully understood. What increases the risk? The following factors may make you more likely to develop this condition:  Having a new sexual partner or multiple sexual partners.  Having unprotected sex.  Douching.  Having an intrauterine device (IUD).  Smoking.  Drug and alcohol abuse.  Taking certain antibiotic medicines.  Being pregnant. You cannot get bacterial vaginosis from toilet seats, bedding, swimming pools, or contact with objects around you. What are the signs or symptoms? Symptoms of this condition include:  Grey or white vaginal discharge. The discharge can also be watery or foamy.  A fish-like odor with  discharge, especially after sexual intercourse or during menstruation.  Itching in and around the vagina.  Burning or pain with urination. Some women with bacterial vaginosis have no signs or symptoms. How is this diagnosed? This condition is diagnosed based on:  Your medical history.  A physical exam of the vagina.  Testing a sample of vaginal fluid under a microscope to look for a large amount of bad bacteria or abnormal cells. Your health care provider may use a cotton swab or a small wooden spatula to collect the sample. How is this treated? This condition is treated with antibiotics. These may be given as a pill, a vaginal cream, or a medicine that is put into the vagina (suppository). If the condition comes back after treatment, a second round of antibiotics may be needed. Follow these instructions at home: Medicines  Take over-the-counter and prescription medicines only as told by your health care provider.  Take or use your antibiotic as told by your health care provider. Do not stop taking or using the antibiotic even if you start to feel better. General instructions  If you have a female sexual partner, tell her that you have a vaginal infection. She should see her health care provider and be treated if she has symptoms. If you have a female sexual partner, he does not need treatment.  During  treatment: ? Avoid sexual activity until you finish treatment. ? Do not douche. ? Avoid alcohol as directed by your health care provider. ? Avoid breastfeeding as directed by your health care provider.  Drink enough water and fluids to keep your urine clear or pale yellow.  Keep the area around your vagina and rectum clean. ? Wash the area daily with warm water. ? Wipe yourself from front to back after using the toilet.  Keep all follow-up visits as told by your health care provider. This is important. How is this prevented?  Do not douche.  Wash the outside of your vagina with  warm water only.  Use protection when having sex. This includes latex condoms and dental dams.  Limit how many sexual partners you have. To help prevent bacterial vaginosis, it is best to have sex with just one partner (monogamous).  Make sure you and your sexual partner are tested for STIs.  Wear cotton or cotton-lined underwear.  Avoid wearing tight pants and pantyhose, especially during summer.  Limit the amount of alcohol that you drink.  Do not use any products that contain nicotine or tobacco, such as cigarettes and e-cigarettes. If you need help quitting, ask your health care provider.  Do not use illegal drugs. Where to find more information  Centers for Disease Control and Prevention: AppraiserFraud.fi  American Sexual Health Association (ASHA): www.ashastd.org  U.S. Department of Health and Financial controller, Office on Women's Health: DustingSprays.pl or SecuritiesCard.it Contact a health care provider if:  Your symptoms do not improve, even after treatment.  You have more discharge or pain when urinating.  You have a fever.  You have pain in your abdomen.  You have pain during sex.  You have vaginal bleeding between periods. Summary  Bacterial vaginosis is a vaginal infection that occurs when the normal balance of bacteria in the vagina is disrupted.  Because bacterial vaginosis increases your risk for STIs (sexually transmitted infections), getting treated can help reduce your risk for chlamydia, gonorrhea, herpes, and HIV (human immunodeficiency virus). Treatment is also important for preventing complications in pregnant women, because the condition can cause an early (premature) delivery.  This condition is treated with antibiotic medicines. These may be given as a pill, a vaginal cream, or a medicine that is put into the vagina (suppository). This information is not intended to replace advice given to you by your  health care provider. Make sure you discuss any questions you have with your health care provider. Document Revised: 12/06/2016 Document Reviewed: 09/09/2015 Elsevier Patient Education  2020 [employer].

## 2019-07-31 NOTE — Assessment & Plan Note (Signed)
Wet prep positive for yeast  - prescribed diflucan, multiple tablets as patient commonly reports yeast infection following abx regimen for BV

## 2019-08-11 ENCOUNTER — Ambulatory Visit (INDEPENDENT_AMBULATORY_CARE_PROVIDER_SITE_OTHER): Payer: Self-pay | Admitting: Physician Assistant

## 2019-08-13 ENCOUNTER — Encounter: Payer: Self-pay | Admitting: Family Medicine

## 2019-08-13 ENCOUNTER — Ambulatory Visit (INDEPENDENT_AMBULATORY_CARE_PROVIDER_SITE_OTHER): Payer: Self-pay | Admitting: Family Medicine

## 2019-08-13 ENCOUNTER — Other Ambulatory Visit: Payer: Self-pay

## 2019-08-13 VITALS — BP 126/80 | HR 81 | Ht 65.5 in | Wt 330.8 lb

## 2019-08-13 DIAGNOSIS — K219 Gastro-esophageal reflux disease without esophagitis: Secondary | ICD-10-CM

## 2019-08-13 DIAGNOSIS — M79672 Pain in left foot: Secondary | ICD-10-CM

## 2019-08-13 DIAGNOSIS — N898 Other specified noninflammatory disorders of vagina: Secondary | ICD-10-CM

## 2019-08-13 DIAGNOSIS — N760 Acute vaginitis: Secondary | ICD-10-CM

## 2019-08-13 DIAGNOSIS — B9689 Other specified bacterial agents as the cause of diseases classified elsewhere: Secondary | ICD-10-CM

## 2019-08-13 LAB — POCT WET PREP (WET MOUNT)
Clue Cells Wet Prep Whiff POC: POSITIVE
Trichomonas Wet Prep HPF POC: ABSENT

## 2019-08-13 MED ORDER — OMEPRAZOLE 20 MG PO CPDR: 20.0000 mg | DELAYED_RELEASE_CAPSULE | Freq: Every day | ORAL | 1 refills | Status: DC

## 2019-08-13 NOTE — Progress Notes (Signed)
    SUBJECTIVE:   CHIEF COMPLAINT / HPI: vaginal odor   Carla Hodges is a 39 year old female presenting for evaluation of vaginal odor.  She was just recently seen on 7/23 and diagnosed with BV, given 7-day Flagyl course.  She felt like that improved the odor, however it came right back.  Notes white discharge.  No particular irritation/itching, fever, abdominal pain.  Last sexually active 3 weeks ago, intermittent barrier protection use.  Has IUD in place, placed about 1 year ago.  Does not feel that her amount of vaginal infections has increased since IUD insertion.  She has had at least 3 episodes of documented BV in the last 6 months.  About to start boric acid prevention.  PERTINENT  PMH / PSH: Recurrent BV   OBJECTIVE:   BP 126/80   Pulse 81   Ht 5' 5.5" (1.664 m)   Wt (!) 330 lb 12.8 oz (150 kg)   SpO2 96%   BMI 54.21 kg/m   General: Alert, NAD Lungs: No increased WOB  Abdomen: soft, non-tender Ext: Warm, dry Pelvic exam: VULVA: normal appearing vulva with no masses, tenderness or lesions, VAGINA: normal appearing vagina with normal color, scant white discharge, no lesions, CERVIX: normal appearing cervix without lesions, no cervical motion tenderness.  ASSESSMENT/PLAN:   BV (bacterial vaginosis) Present on wet prep, consistent with symptomatology.  Rx Flagyl x7 days with post-treatment Diflucan.  Recommended proceeding with a 30-day vaginal boric acid treatment after completion of therapy, will discuss then starting twice weekly MetroGel for 4-6 months to prevent further recurrent BV.  GERD Refilled Nexium per patient request.  Foot pain, left Podiatry referral did not go through for this previously.  Patient with orange card and financial aid, will see if this can be placed (in addition to [facility]).    Follow-up if not improving or sooner if worsening.  Carla Hodges, [place]

## 2019-08-13 NOTE — Patient Instructions (Addendum)
Was wonderful to see you today.  I am so sorry that you continue to struggle with recurrent infections.  Continue to do the vaginal boric acid to help with prevention.  Please also make sure you are drinking plenty of water, avoiding tight clothing, and avoiding any soaps within the vaginal area.  I will let you know when the wet prep results.  I will also replaced referrals for your eyes and feet.

## 2019-08-14 ENCOUNTER — Encounter: Payer: Self-pay | Admitting: Family Medicine

## 2019-08-14 MED ORDER — METRONIDAZOLE 500 MG PO TABS: 500.0000 mg | ORAL_TABLET | Freq: Two times a day (BID) | ORAL | 1 refills | Status: AC

## 2019-08-14 MED ORDER — FLUCONAZOLE 150 MG PO TABS: 150.0000 mg | ORAL_TABLET | Freq: Once | ORAL | 0 refills | Status: AC

## 2019-08-14 MED ORDER — OMEPRAZOLE 20 MG PO CPDR: 20.0000 mg | DELAYED_RELEASE_CAPSULE | Freq: Every day | ORAL | 1 refills | Status: DC

## 2019-08-14 NOTE — Assessment & Plan Note (Addendum)
Podiatry referral did not go through for this previously.  Patient with orange card and financial aid, will see if this can be placed (in addition to [facility]).

## 2019-08-14 NOTE — Assessment & Plan Note (Signed)
Refilled Nexium per patient request.

## 2019-08-14 NOTE — Assessment & Plan Note (Addendum)
Present on wet prep, consistent with symptomatology.  Rx Flagyl x7 days with post-treatment Diflucan.  Recommended proceeding with a 30-day vaginal boric acid treatment after completion of therapy, will discuss then starting twice weekly MetroGel for 4-6 months to prevent further recurrent BV.

## 2019-08-20 ENCOUNTER — Telehealth: Payer: Self-pay

## 2019-08-20 ENCOUNTER — Other Ambulatory Visit: Payer: Self-pay | Admitting: Family Medicine

## 2019-08-20 DIAGNOSIS — N760 Acute vaginitis: Secondary | ICD-10-CM

## 2019-08-20 DIAGNOSIS — B9689 Other specified bacterial agents as the cause of diseases classified elsewhere: Secondary | ICD-10-CM

## 2019-08-20 MED ORDER — FLUCONAZOLE 150 MG PO TABS: 150.0000 mg | ORAL_TABLET | ORAL | 0 refills | Status: DC

## 2019-08-20 NOTE — Telephone Encounter (Signed)
Patient calls nurse line requesting additional diflucan rx to be sent into pharmacy. Patient reports that she typically needs two pills for symptoms to improve. Please send to pharmacy on file, if appropriate.   To PCP  Talbot Grumbling, RN

## 2019-08-25 ENCOUNTER — Ambulatory Visit (INDEPENDENT_AMBULATORY_CARE_PROVIDER_SITE_OTHER): Payer: Self-pay | Admitting: Family Medicine

## 2019-08-25 ENCOUNTER — Other Ambulatory Visit: Payer: Self-pay

## 2019-08-25 ENCOUNTER — Encounter: Payer: Self-pay | Admitting: Family Medicine

## 2019-08-25 VITALS — BP 118/82 | HR 82 | Ht 65.5 in | Wt 336.0 lb

## 2019-08-25 DIAGNOSIS — M7989 Other specified soft tissue disorders: Secondary | ICD-10-CM

## 2019-08-25 DIAGNOSIS — J410 Simple chronic bronchitis: Secondary | ICD-10-CM

## 2019-08-25 DIAGNOSIS — B9689 Other specified bacterial agents as the cause of diseases classified elsewhere: Secondary | ICD-10-CM

## 2019-08-25 DIAGNOSIS — N898 Other specified noninflammatory disorders of vagina: Secondary | ICD-10-CM

## 2019-08-25 DIAGNOSIS — J42 Unspecified chronic bronchitis: Secondary | ICD-10-CM | POA: Insufficient documentation

## 2019-08-25 DIAGNOSIS — N760 Acute vaginitis: Secondary | ICD-10-CM

## 2019-08-25 DIAGNOSIS — R062 Wheezing: Secondary | ICD-10-CM

## 2019-08-25 LAB — POCT URINALYSIS DIP (MANUAL ENTRY)
Bilirubin, UA: NEGATIVE
Blood, UA: NEGATIVE
Glucose, UA: NEGATIVE mg/dL
Ketones, POC UA: NEGATIVE mg/dL
Leukocytes, UA: NEGATIVE
Nitrite, UA: NEGATIVE
Protein Ur, POC: NEGATIVE mg/dL
Spec Grav, UA: 1.025 (ref 1.010–1.025)
Urobilinogen, UA: 0.2 E.U./dL
pH, UA: 6 (ref 5.0–8.0)

## 2019-08-25 LAB — POCT WET PREP (WET MOUNT)
Clue Cells Wet Prep Whiff POC: NEGATIVE
Trichomonas Wet Prep HPF POC: ABSENT

## 2019-08-25 MED ORDER — METRONIDAZOLE 500 MG PO TABS: 500.0000 mg | ORAL_TABLET | Freq: Two times a day (BID) | ORAL | 0 refills | Status: AC

## 2019-08-25 MED ORDER — ALBUTEROL SULFATE HFA 108 (90 BASE) MCG/ACT IN AERS: 2.0000 | INHALATION_SPRAY | RESPIRATORY_TRACT | 0 refills | Status: DC | PRN

## 2019-08-25 MED ORDER — FLUCONAZOLE 150 MG PO TABS: 150.0000 mg | ORAL_TABLET | ORAL | 0 refills | Status: DC

## 2019-08-25 NOTE — Assessment & Plan Note (Signed)
Recurrent, present once again on wet prep today with consistent symptoms.  Rx'd Flagyl BID x 7 days and recommended concurrent vaginal boric acid 600 mg daily for the next 22 days (24-day total course).  Follow-up in 3 weeks after completion of boric acid, if suppressed appropriately will start twice weekly MetroGel.

## 2019-08-25 NOTE — Patient Instructions (Addendum)
It was wonderful to see you today.  For your legs, I encourage you to elevate them as often as possible at the end of your workday.  Also start wearing compression stockings during the day, you can get this at a local pharmacy or on [employer].  Amount of tightness would be around 20-30 mmHg.  This should help get the fluid and blood back up to your heart.  I have sent in one additional course of Flagyl, continue using the boric acid during this treatment and continue for 30 days.  If you continue to do well after this then we will start MetroGel (vaginal gel) twice weekly for several months.

## 2019-08-25 NOTE — Assessment & Plan Note (Signed)
Chronic, stable.  Bilateral and equal.  Previous evaluation without evidence of anemia, thyroid, liver/renal dysfunction, or glucose abnormality contributing.  U/a without proteinuria today.  Likely dependent in the setting of elevated BMI (55), recommended wearing compression stockings daily during work and elevating legs at night.  Consider OSA discussion on follow-up if sleep apnea may be contributing given body habitus.

## 2019-08-25 NOTE — Progress Notes (Addendum)
° ° °  SUBJECTIVE:   CHIEF COMPLAINT / HPI: Continued vaginal odor  Carla Hodges is a 39 year old female presenting for follow-up of continued vaginal odor.    Vaginal odor: Recently seen on 8/6 diagnosed with recurrent BV, Rx Flagyl for 7 days and then posttreatment Diflucan.  Additionally recommended continuing 30-day vaginal boric acid.   Today, she reports recurrent fishy odor.  She completed Flagyl and Diflucan and felt that the odor and amount of discharge was slightly better, however came back shortly after.  She has complete about 2 days of the vaginal boric acid.  Leg swelling: Present for the past several months.  Evaluated in 05/2019 by Dr. Tarry Kos for this concern.  CBC, CMP without any evidence of anemia, kidney/liver dysfunction, or electrolyte derangements.  Bilateral and pitting.  Worse by the end of the day, not present in the morning.  No associated palpitations, difficulty breathing, orthopnea.  Did have a mild amount of protein in her urine during that visit.  Wheezing: Reports history of chronic bronchitis.  Feels like she has heard herself wheeze recently.  Denies any difficulty breathing at rest, chest pain, coughing.  No associated fever or malaise.  Non-smoker.  PERTINENT  PMH / PSH: Recurrent BV, IUD in place  OBJECTIVE:   BP 118/82    Pulse 82    Ht 5' 5.5" (1.664 m)    Wt (!) 336 lb (152.4 kg)    SpO2 97%    BMI 55.06 kg/m   General: Alert, NAD HEENT: NCAT, MMM  Cardiac: RRR  Lungs: Slight expiratory wheeze bibasilarly, otherwise clear throughout.  Satting appropriately on room air and breathing comfortable, speaking in full sentences. Abdomen: soft, non-tender, non-distended, normoactive BS Msk: Moves all extremities spontaneously  Pelvic exam: VULVA: normal appearing vulva with no masses, tenderness or lesions, swapped vaginal region with white creamy discharge present on swab. WET MOUNT done - results: clue cells, excessive bacteria. Ext: Warm, dry, 2+ distal pulses,  1+ pitting edema to level of knee bilaterally without any skin erythema, bruising, or rash noted.  Full ROM of bilateral ankle joints, no effusion appreciated.  ASSESSMENT/PLAN:   BV (bacterial vaginosis) Recurrent, present once again on wet prep today with consistent symptoms.  Rx'd Flagyl BID x 7 days and recommended concurrent vaginal boric acid 600 mg daily for the next 22 days (24-day total course).  Follow-up in 3 weeks after completion of boric acid, if suppressed appropriately will start twice weekly MetroGel.  Swelling of lower extremity Chronic, stable.  Bilateral and equal.  Previous evaluation without evidence of anemia, thyroid, liver/renal dysfunction, or glucose abnormality contributing.  U/a without proteinuria today.  Likely dependent in the setting of elevated BMI (55), recommended wearing compression stockings daily during work and elevating legs at night.  Consider OSA discussion on follow-up if sleep apnea may be contributing given body habitus.  Wheezing on expiration Faint wheeze present on exam.  Patient reports history of chronic bronchitis, however do question previously undiagnosed reactive airway especially without significant smoking history.  Rx albuterol to see if improves symptoms, should consider PFTs in the future.    Follow-up in 3 weeks for above or sooner if needed.  Patriciaann Clan, [place]

## 2019-08-25 NOTE — Assessment & Plan Note (Signed)
Faint wheeze present on exam.  Patient reports history of chronic bronchitis, however do question previously undiagnosed reactive airway especially without significant smoking history.  Rx albuterol to see if improves symptoms, should consider PFTs in the future.

## 2019-08-26 LAB — PROTEIN / CREATININE RATIO, URINE
Creatinine, Urine: 200.8 mg/dL
Protein, Ur: 12.9 mg/dL
Protein/Creat Ratio: 64 mg/g creat (ref 0–200)

## 2019-09-14 ENCOUNTER — Telehealth: Payer: Self-pay | Admitting: Family Medicine

## 2019-09-14 NOTE — Telephone Encounter (Signed)
Patient is calling stating she is having vaginal odor and that she really doesn't need to come in because Dr. Higinio Plan knows what is going on and can call in her the medication she needs. I schedule appointment for the 13th at 4:10. Patient is wanting dr. Higinio Plan to call her. [phone]. Thanks

## 2019-09-15 ENCOUNTER — Other Ambulatory Visit: Payer: Self-pay | Admitting: Family Medicine

## 2019-09-15 DIAGNOSIS — B9689 Other specified bacterial agents as the cause of diseases classified elsewhere: Secondary | ICD-10-CM

## 2019-09-15 MED ORDER — METRONIDAZOLE 0.75 % VA GEL: 1.0000 | Freq: Every day | VAGINAL | 3 refills | Status: DC

## 2019-09-15 NOTE — Telephone Encounter (Signed)
Call patient to discuss.  Having recurrent odor, likely BV.  Would like to refrain from adding additional oral Flagyl course she has had this several times recently.  We will proceed with MetroGel X 5 days and then continue twice weekly for prophylaxis.  Concerned MetroGel may be too expensive, she will go pick it up tonight and see what it is.  She will call me tomorrow if it is indeed the $40 for just a 1 week supply vs several weeks.  Patriciaann Clan, DO

## 2019-09-20 ENCOUNTER — Ambulatory Visit: Payer: Self-pay | Admitting: Family Medicine

## 2019-09-30 ENCOUNTER — Encounter: Payer: Self-pay | Admitting: Family Medicine

## 2019-10-06 ENCOUNTER — Telehealth: Payer: Self-pay

## 2019-10-06 NOTE — Progress Notes (Signed)
    SUBJECTIVE:   CHIEF COMPLAINT / HPI: BV  Carla Hodges is a 39 year old female presenting discussed the following:  Recurrent BV: Several episodes over the past several months.  Officially documented via wet prep on 8/18, 8/6, 7/23, and then several episodes in April/March/earlier 2020/2021.  She additionally had few episodes in between this time that were treated empirically.  Last use oral Flagyl on 8/18.  She recently started a course of MetroGel on 9/23, however continues to have odor and chunky vaginal discharge consistent with previous episodes.  He is already previously completed a course of vaginal boric acid with minimal success.  She is intermittently sexually active, does not use condoms.  Did not have these concerns prior to IUD placement, however had the IUD in for about 1 year prior to recurrent infections.   PERTINENT  PMH / PSH: Recurrent BV, IUD in place, anxiety/depression, elevated BMI  OBJECTIVE:   BP 122/82   Pulse 72   Wt (!) 332 lb (150.6 kg)   SpO2 100%   BMI 54.41 kg/m   General: Alert, NAD HEENT: NCAT, MMM Lungs: No increased WOB  Abdomen: soft Ext: Warm, dry  ASSESSMENT/PLAN:   BV (bacterial vaginosis) Recurrent.  Will empirically treat with oral clindamycin course today and following this immediately start MetroGel 3 times weekly for prophylaxis.  Adamantly discussed importance of condom use with every sexual encounter to hopefully decrease recurrence, already following appropriate pelvic hygiene.  Unfortunately have a high clinical suspicion that her IUD has been contributing, however she would like to try the above regimen first prior to pulling IUD.  She will consider IUD removal with Nexplanon placement on follow-up after speaking with partner.    Follow-up pending on symptoms  Patriciaann Clan, [place]

## 2019-10-06 NOTE — Telephone Encounter (Signed)
This has been sent to the wrong office   Copied from [place] [phone]. Topic: Appointment Scheduling - Scheduling Inquiry for Clinic >> Oct 06, 2019 11:26 AM Erick Blinks wrote: Reason for CRM: Pt needs to renew her orange card, please advise  Best contact: [phone]

## 2019-10-07 ENCOUNTER — Other Ambulatory Visit: Payer: Self-pay

## 2019-10-07 ENCOUNTER — Ambulatory Visit (INDEPENDENT_AMBULATORY_CARE_PROVIDER_SITE_OTHER): Payer: Self-pay | Admitting: Family Medicine

## 2019-10-07 VITALS — BP 122/82 | HR 72 | Wt 332.0 lb

## 2019-10-07 DIAGNOSIS — B9689 Other specified bacterial agents as the cause of diseases classified elsewhere: Secondary | ICD-10-CM

## 2019-10-07 DIAGNOSIS — N760 Acute vaginitis: Secondary | ICD-10-CM

## 2019-10-07 MED ORDER — FLUCONAZOLE 150 MG PO TABS: 150.0000 mg | ORAL_TABLET | ORAL | 0 refills | Status: DC

## 2019-10-07 MED ORDER — CLINDAMYCIN HCL 300 MG PO CAPS: 300.0000 mg | ORAL_CAPSULE | Freq: Two times a day (BID) | ORAL | 0 refills | Status: DC

## 2019-10-07 NOTE — Patient Instructions (Signed)
You can use the clindamycin twice daily for 7 days.  Afterwards if symptoms improved, start using the MetroGel 3 times weekly.  Please be aware that the clindamycin can cause diarrhea, make sure that you are staying well-hydrated.  Please make sure you use condoms with every sexual encounter.  Please discuss with your partner about trying the Nexplanon which is the implant that goes on the arm and taking out your IUD to see if this makes a difference in your recurrent infection.  We can give a trial of the above in the next 1-2 months, however if you have recurrence of strongly encourage you to consider this.

## 2019-10-07 NOTE — Assessment & Plan Note (Addendum)
Recurrent.  Will empirically treat with oral clindamycin course today and following this immediately start MetroGel 3 times weekly for prophylaxis.  Adamantly discussed importance of condom use with every sexual encounter to hopefully decrease recurrence, already following appropriate pelvic hygiene.  Unfortunately have a high clinical suspicion that her IUD has been contributing, however she would like to try the above regimen first prior to pulling IUD.  She will consider IUD removal with Nexplanon placement on follow-up after speaking with partner.

## 2019-10-28 ENCOUNTER — Other Ambulatory Visit: Payer: Self-pay

## 2019-10-28 ENCOUNTER — Ambulatory Visit (INDEPENDENT_AMBULATORY_CARE_PROVIDER_SITE_OTHER): Payer: Self-pay

## 2019-10-28 DIAGNOSIS — Z23 Encounter for immunization: Secondary | ICD-10-CM

## 2019-10-28 NOTE — Progress Notes (Signed)
   Covid-19 Vaccination Clinic  Name:  Carla Hodges    MRN: [id number] DOB: [birth year]  10/28/2019  [name] was observed post Covid-19 immunization for 15 minutes without incident. She was provided with Vaccine Information Sheet and instruction to access the V-Safe system.   [name] was instructed to call 911 with any severe reactions post vaccine: Marland Kitchen Difficulty breathing  . Swelling of face and throat  . A fast heartbeat  . A bad rash all over body  . Dizziness and weakness   #1 Covid Vaccine administered LD without complication. #2 Covid Vaccine due 11/18/2019.

## 2019-11-08 ENCOUNTER — Ambulatory Visit: Payer: Self-pay | Attending: Podiatry | Admitting: Podiatry

## 2019-11-08 ENCOUNTER — Other Ambulatory Visit: Payer: Self-pay

## 2019-11-08 DIAGNOSIS — M2142 Flat foot [pes planus] (acquired), left foot: Secondary | ICD-10-CM

## 2019-11-08 DIAGNOSIS — M779 Enthesopathy, unspecified: Secondary | ICD-10-CM

## 2019-11-08 DIAGNOSIS — M2141 Flat foot [pes planus] (acquired), right foot: Secondary | ICD-10-CM

## 2019-11-08 DIAGNOSIS — M79672 Pain in left foot: Secondary | ICD-10-CM

## 2019-11-11 NOTE — Patient Instructions (Signed)
It was great to see you!  Our plans for today:  -Today we discussed your vaginal discharge/vaginal odor. We performed a wet prep which showed bacterial vaginosis. -I have sent in clindamycin. -I would like for you make a follow-up appointment with Dr. Higinio Plan to discuss the Nexplanon and removal of the IUD if you decide this is what you wish to do.   Take care and seek immediate care sooner if you develop any concerns.   Dr. [facility]

## 2019-11-11 NOTE — Progress Notes (Signed)
     SUBJECTIVE:   CHIEF COMPLAINT / HPI:   Vaginal odor/discharge: Patient is a 39 year old female the presents today for vaginal odor.  She states this is been going on since last Friday.  She states this got worse around Wednesday of this past week.  She states that she has had some discharge and it has been of a whitish consistency and had a fishy odor similar to the last time when she was diagnosed with bacterial vaginosis.  She states that in the past she is using metronidazole but recently was prescribed oral clindamycin due to recurrent bouts of BV. Patient states that previously it was thought that her IUD may be contributing to her recurrent bouts of BV.  She states that she is spoken with her partner and will plan in the future to get a Nexplanon and have the IUD removed.  PERTINENT  PMH / PSH: History of bacterial vaginosis.  OBJECTIVE:   BP 128/74   Pulse 82   Ht 5\' 5"  (1.651 m)   Wt (!) 323 lb 9.6 oz (146.8 kg)   BMI 53.85 kg/m    General: NAD, pleasant, able to participate in exam Respiratory: No respiratory distress Pelvic exam: VULVA: normal appearing vulva with no masses, tenderness or lesions, VAGINA: vaginal tenderness negative, vaginal erythema negative, vaginal lesion negative, vaginal discharge - white and creamy. Psych: Normal affect and mood  ASSESSMENT/PLAN:   BV (bacterial vaginosis) Assessment: 39 year old female with a history of recurrent bacterial vaginosis presenting with 7 days of vaginal discharge and odor.  Patient states that the discharge has been similar to the last time when she was diagnosed with bacterial vaginosis and that it was of a white consistency with a fishy odor.  Patient states that in the past she has had to use clindamycin because of the recurrent nature of her bacterial vaginosis bouts.  She has not interested in STI testing at this time. Plan: -Wet prep performed which showed bacterial vaginosis. -We will prescribe clindamycin as  well as Diflucan to use afterwards for yeast infections as patient often gets these. -Patient plans make a follow-up appointment with Dr. Higinio Plan to have her Nexplanon placed in her IUD removed as this is thought to be a potential cause of her recurrent bouts of bacterial vaginosis.Carla Del, DO [place]    This note was prepared using Dragon voice recognition software and may include unintentional dictation errors due to the inherent limitations of voice recognition software.

## 2019-11-12 ENCOUNTER — Other Ambulatory Visit: Payer: Self-pay

## 2019-11-12 ENCOUNTER — Ambulatory Visit (INDEPENDENT_AMBULATORY_CARE_PROVIDER_SITE_OTHER): Payer: Self-pay | Admitting: Family Medicine

## 2019-11-12 VITALS — BP 128/74 | HR 82 | Ht 65.0 in | Wt 323.6 lb

## 2019-11-12 DIAGNOSIS — N760 Acute vaginitis: Secondary | ICD-10-CM

## 2019-11-12 DIAGNOSIS — B9689 Other specified bacterial agents as the cause of diseases classified elsewhere: Secondary | ICD-10-CM

## 2019-11-12 DIAGNOSIS — N898 Other specified noninflammatory disorders of vagina: Secondary | ICD-10-CM

## 2019-11-12 LAB — POCT WET PREP (WET MOUNT)
Clue Cells Wet Prep Whiff POC: POSITIVE
Trichomonas Wet Prep HPF POC: ABSENT

## 2019-11-12 MED ORDER — FLUCONAZOLE 150 MG PO TABS
150.0000 mg | ORAL_TABLET | Freq: Once | ORAL | 0 refills | Status: AC
Start: 2019-11-12 — End: 2019-11-12

## 2019-11-12 MED ORDER — CLINDAMYCIN HCL 300 MG PO CAPS: 300.0000 mg | ORAL_CAPSULE | Freq: Two times a day (BID) | ORAL | 0 refills | Status: AC

## 2019-11-12 NOTE — Assessment & Plan Note (Signed)
Assessment: 39 year old female with a history of recurrent bacterial vaginosis presenting with 7 days of vaginal discharge and odor.  Patient states that the discharge has been similar to the last time when she was diagnosed with bacterial vaginosis and that it was of a white consistency with a fishy odor.  Patient states that in the past she has had to use clindamycin because of the recurrent nature of her bacterial vaginosis bouts.  She has not interested in STI testing at this time. Plan: -Wet prep performed which showed bacterial vaginosis. -We will prescribe clindamycin as well as Diflucan to use afterwards for yeast infections as patient often gets these. -Patient plans make a follow-up appointment with Dr. Higinio Plan to have her Nexplanon placed in her IUD removed as this is thought to be a potential cause of her recurrent bouts of bacterial vaginosis.Marland Kitchen

## 2019-11-12 NOTE — Progress Notes (Signed)
Subjective:   Patient ID: Carla Hodges, female   DOB: 39 y.o.   MRN: [id number]   HPI Seen at the [facility]  39 year old female presents the office today with concerns of chronic left foot pain.  She states the pain.  More consistent she points to lateral, plantar aspect of foot where she gets majority discomfort.  She states that she stands on her feet all day and she wears a compression socks which helps some.  Swelling about 2 years or more.  She states it hurts but does not cause her to stop working or general activities.  No recent injury or fall that she reports.  X-rays ordered previously but she does not have this performed.  She has no other concerns today.   Review of Systems  All other systems reviewed and are negative.  Past Medical History:  Diagnosis Date  . Breast nodule 11/06/2015  . Bronchitis, chronic ([place])   . Depression    moderate  . Fibroids   . 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 HYSTEROSCOPY     2000  . LAPAROSCOPY N/A 06/12/2016   Procedure: LAPAROSCOPY DIAGNOSTIC WITH PERITONEAL BIOSPSY;  Surgeon: Emily Filbert, MD;  Location: [facility];  Service: Gynecology;  Laterality: N/A;  . LAPAROSCOPY N/A 06/12/2016   Procedure: LAPAROSCOPY DIAGNOSTIC, EVACUATION OF HEMATOMA;  Surgeon: Emily Filbert, MD;  Location: [facility];  Service: Gynecology;  Laterality: N/A;     Current Outpatient Medications:  .  albuterol (VENTOLIN HFA) 108 (90 Base) MCG/ACT inhaler, Inhale 2 puffs into the lungs every 4 (four) hours as needed for wheezing or shortness of breath., Disp: 8 g, Rfl: 0 .  CHORIOGONADOTROPIN ALFA [place], Inject 35 Units into the skin every Monday, Wednesday, and Friday., Disp: , Rfl:  .  Chorionic Gonadotropin (HCG IJ), Inject as directed. Pt reports taking M, W, F; unsure of dosage, Disp: , Rfl:  .  fluconazole (DIFLUCAN) 150 MG tablet, Take 1  tablet (150 mg total) by mouth every 3 (three) days., Disp: 2 tablet, Rfl: 0 .  ibuprofen (ADVIL) 800 MG tablet, Take 1 tablet (800 mg total) by mouth every 8 (eight) hours as needed for mild pain or cramping., Disp: 30 tablet, Rfl: 0 .  metroNIDAZOLE (METROGEL VAGINAL) 0.75 % vaginal gel, Place 1 Applicatorful vaginally at bedtime., Disp: 70 g, Rfl: 3 .  omeprazole (PRILOSEC) 20 MG capsule, Take 1 capsule (20 mg total) by mouth daily., Disp: 30 capsule, Rfl: 1 .  ondansetron (ZOFRAN ODT) 4 MG disintegrating tablet, Take 1 tablet (4 mg total) by mouth every 8 (eight) hours as needed for nausea or vomiting., Disp: 20 tablet, Rfl: 0  Allergies  Allergen Reactions  . Latex Other (See Comments)    "break out down there" with latex condom use         Objective:  Physical Exam  General: AAO x3, NAD, obese  Dermatological: Skin is warm, dry and supple bilateral.  There are no open sores, no preulcerative lesions, no rash or signs of infection present.  Vascular: Dorsalis Pedis artery and Posterior Tibial artery pedal pulses are 2/4 bilateral with immedate capillary fill time. There is no pain with calf compression, swelling, warmth, erythema.   Neruologic: Grossly intact via light touch bilateral.  Musculoskeletal: Decreased medial arch upon weightbearing.  There is tenderness mostly on the fourth, fifth metatarsal  cuboid joint on fifth metatarsal base.  There is no significant discomfort in the course the peroneal tendon.  There is no specific area of pinpoint tenderness.  Flexor, extensor tendons are intact.  Muscular strength 5/5 in all groups tested bilateral.  Gait: Unassisted, Nonantalgic.       Assessment:   Left foot capsulitis, flatfoot     Plan:  -Treatment options discussed including all alternatives, risks, and complications -Etiology of symptoms were discussed -Ordered x-rays of the left foot today. -Offered steroid injection but she wishes to hold off. -Voltaren gel to  the foot; we discussed ibuprofen or other anti-inflammatories but she states that she takes this for other issues and has not been helpful. -We discussed wearing more supportive shoes and also inserts for shoes.     Trula Slade DPM

## 2019-11-24 ENCOUNTER — Other Ambulatory Visit: Payer: Self-pay

## 2019-11-24 ENCOUNTER — Ambulatory Visit (INDEPENDENT_AMBULATORY_CARE_PROVIDER_SITE_OTHER): Payer: Self-pay

## 2019-11-24 ENCOUNTER — Ambulatory Visit: Payer: Self-pay

## 2019-11-24 DIAGNOSIS — Z23 Encounter for immunization: Secondary | ICD-10-CM

## 2019-11-24 NOTE — Progress Notes (Signed)
   Covid-19 Vaccination Clinic  Name:  JESSICAANN OVERBAUGH    MRN: [id number] DOB: 02-29-1980  11/24/2019  Patient presents to nurse clinic for second [place] vaccination. Patient denies history of previous allergic reaction and answers no to all screening questions. Administered in LD, site unremarkable, tolerated injection well.   Ms. Hunzeker was observed post Covid-19 immunization for 15 minutes without incident. She was provided with Vaccine Information Sheet and instruction to access the V-Safe system.   Ms. Chasse was instructed to call 911 with any severe reactions post vaccine: Marland Kitchen Difficulty breathing  . Swelling of face and throat  . A fast heartbeat  . A bad rash all over body  . Dizziness and weakness   Provided patient with updated immunization card and record.   Talbot Grumbling, RN

## 2019-12-13 ENCOUNTER — Ambulatory Visit: Payer: Self-pay | Admitting: Critical Care Medicine

## 2019-12-20 ENCOUNTER — Ambulatory Visit (INDEPENDENT_AMBULATORY_CARE_PROVIDER_SITE_OTHER): Payer: Self-pay | Admitting: Family Medicine

## 2019-12-20 ENCOUNTER — Other Ambulatory Visit: Payer: Self-pay

## 2019-12-20 ENCOUNTER — Encounter: Payer: Self-pay | Admitting: Family Medicine

## 2019-12-20 VITALS — BP 132/80 | HR 72 | Ht 65.0 in | Wt 322.0 lb

## 2019-12-20 DIAGNOSIS — N760 Acute vaginitis: Secondary | ICD-10-CM

## 2019-12-20 DIAGNOSIS — B9689 Other specified bacterial agents as the cause of diseases classified elsewhere: Secondary | ICD-10-CM

## 2019-12-20 DIAGNOSIS — T3695XA Adverse effect of unspecified systemic antibiotic, initial encounter: Secondary | ICD-10-CM

## 2019-12-20 DIAGNOSIS — N946 Dysmenorrhea, unspecified: Secondary | ICD-10-CM

## 2019-12-20 DIAGNOSIS — B379 Candidiasis, unspecified: Secondary | ICD-10-CM

## 2019-12-20 LAB — POCT WET PREP (WET MOUNT)
Clue Cells Wet Prep Whiff POC: POSITIVE
Trichomonas Wet Prep HPF POC: ABSENT

## 2019-12-20 MED ORDER — IBUPROFEN 800 MG PO TABS: 800.0000 mg | ORAL_TABLET | Freq: Three times a day (TID) | ORAL | 0 refills | Status: DC | PRN

## 2019-12-20 MED ORDER — METRONIDAZOLE 500 MG PO TABS: 500.0000 mg | ORAL_TABLET | Freq: Two times a day (BID) | ORAL | 0 refills | Status: AC

## 2019-12-20 MED ORDER — FLUCONAZOLE 150 MG PO TABS: 150.0000 mg | ORAL_TABLET | Freq: Once | ORAL | 0 refills | Status: AC

## 2019-12-20 NOTE — Patient Instructions (Signed)
It was wonderful to see you today.  Today you were seen for BV we sent in tx for 7 days. If you need treatment for yeast infection following please let us know.   Please call the clinic at [phone] if your symptoms worsen or you have any concerns. It was our pleasure to serve you.  Dr. Janus Molder

## 2019-12-20 NOTE — Progress Notes (Signed)
    SUBJECTIVE:   CHIEF COMPLAINT / HPI:   Carla Hodges is a 39 yo F who presents for the issue below.   BV She is experiencing abnormal vaginal discharge and fishy odor for 1 week. This is recurrent for her after having sex with her partner. She also states after treatment for BV she gets a yeast infection and needs fluconazole x2. She discussed with Dr. Higinio Plan prior about removing her IUD and replacing her current form of birth control with Nexplanon. She declines additional STD testing.  Dysmenorrhea Hx of painful periods and fibroids. Has discovered taking ibuprofen 800 mg allows her to be able to complete her ADLs during the time of her cycle. Needs a refill.   PERTINENT  PMH / PSH: Endometriosis  OBJECTIVE:   BP 132/80   Pulse 72   Ht 5\' 5"  (1.651 m)   Wt (!) 322 lb (146.1 kg)   SpO2 99%   BMI 53.58 kg/m   Pelvic exam: VULVA: normal appearing vulva with no masses, tenderness or lesions, VAGINA: normal appearing vagina with normal color and discharge, no lesions, CERVIX: normal appearing cervix without discharge or lesions, WET MOUNT done - results: clue cells, excessive bacteria, exam chaperoned by Delray Alt.  Results for orders placed or performed in visit on 12/20/19 (from the past 48 hour(s))  POCT Wet Prep Lenard Forth [place])     Status: Abnormal   Collection Time: 12/20/19  4:20 PM  Result Value Ref Range   Source Wet Prep POC VAG    WBC, Wet Prep HPF POC NONE    Bacteria Wet Prep HPF POC Many (A) Few   Clue Cells Wet Prep HPF POC Moderate (A) None   Clue Cells Wet Prep Whiff POC Positive Whiff    Yeast Wet Prep HPF POC None None   KOH Wet Prep POC None None   Trichomonas Wet Prep HPF POC Absent Absent     ASSESSMENT/PLAN:   1. BV (bacterial vaginosis) - metroNIDAZOLE (FLAGYL) 500 MG tablet; Take 1 tablet (500 mg total) by mouth 2 (two) times daily for 7 days.  Dispense: 14 tablet; Refill: 0  2. Dysmenorrhea - ibuprofen (ADVIL) 800 MG tablet; Take 1 tablet (800  mg total) by mouth every 8 (eight) hours as needed for mild pain or cramping.  Dispense: 30 tablet; Refill: 0  3. Antibiotic-induced yeast infection - fluconazole (DIFLUCAN) 150 MG tablet; Take 1 tablet (150 mg total) by mouth once for 1 dose. Repeat in 2 days.  Dispense: 2 tablet; Refill: 0   Gerlene Fee, [place]

## 2019-12-22 DIAGNOSIS — N946 Dysmenorrhea, unspecified: Secondary | ICD-10-CM | POA: Insufficient documentation

## 2019-12-22 DIAGNOSIS — T3695XA Adverse effect of unspecified systemic antibiotic, initial encounter: Secondary | ICD-10-CM | POA: Insufficient documentation

## 2019-12-28 ENCOUNTER — Other Ambulatory Visit: Payer: Self-pay | Admitting: Family Medicine

## 2019-12-28 ENCOUNTER — Telehealth: Payer: Self-pay

## 2019-12-28 DIAGNOSIS — B9689 Other specified bacterial agents as the cause of diseases classified elsewhere: Secondary | ICD-10-CM

## 2019-12-28 MED ORDER — CLINDAMYCIN HCL 300 MG PO CAPS
300.0000 mg | ORAL_CAPSULE | Freq: Two times a day (BID) | ORAL | 0 refills | Status: DC
Start: 2019-12-28 — End: 2020-01-10

## 2019-12-28 NOTE — Telephone Encounter (Signed)
Tx sent to patient requested pharmacy.   Carla Witter Autry-Lott, DO 12/28/2019, 11:18 AM PGY-2, [place]

## 2019-12-28 NOTE — Telephone Encounter (Signed)
Patient calls nurse line regarding issues with medication prescribed on visit on 12/13. Patient reports that she discussed with provider that flagyl typically does not work for her, however, they would do initial trial. Patient reports that she is still experiencing fishy odor.   Patient was advised to call provider back if symptoms were persistent and new medication could be sent into pharmacy.   Patient requesting prescription be sent to [facility] on [address].   Please advise.   Talbot Grumbling, RN

## 2020-01-06 ENCOUNTER — Other Ambulatory Visit: Payer: Self-pay

## 2020-01-06 DIAGNOSIS — Z20822 Contact with and (suspected) exposure to covid-19: Secondary | ICD-10-CM

## 2020-01-08 LAB — NOVEL CORONAVIRUS, NAA: SARS-CoV-2, NAA: NOT DETECTED

## 2020-01-08 LAB — SARS-COV-2, NAA 2 DAY TAT

## 2020-01-09 ENCOUNTER — Encounter: Payer: Self-pay | Admitting: Family Medicine

## 2020-01-10 ENCOUNTER — Telehealth: Payer: Self-pay | Admitting: Family Medicine

## 2020-01-10 ENCOUNTER — Other Ambulatory Visit: Payer: Self-pay | Admitting: Family Medicine

## 2020-01-10 DIAGNOSIS — B9689 Other specified bacterial agents as the cause of diseases classified elsewhere: Secondary | ICD-10-CM

## 2020-01-10 MED ORDER — CLINDAMYCIN HCL 300 MG PO CAPS
300.0000 mg | ORAL_CAPSULE | Freq: Two times a day (BID) | ORAL | 0 refills | Status: AC
Start: 2020-01-10 — End: 2020-01-17

## 2020-01-10 NOTE — Telephone Encounter (Signed)
Call patient to discuss recent MyChart message with recurrent BV.  She has known recurrent and frequent BV documented via wet prep numerous times.  Symptoms currently consistent with previous.  Requesting oral clindamycin as this seems to be the only effective antibiotic for her now.  Rx'd clindamycin X 7 days.  Discussed increased risk for C. difficile/severe diarrhea with this medication especially as she has had a recent course however she would like to proceed with this and monitor.  Discussed the extreme importance to use condoms with every sexual encounter (has not recently) as this appears to be a precipitant to her recurrent BV.  She is awaiting insurance, and once she officially has this will reschedule her appointment for IUD removal and Nexplanon placement.  Allayne Stack, DO

## 2020-01-12 ENCOUNTER — Ambulatory Visit: Payer: Self-pay | Admitting: Family Medicine

## 2020-01-19 ENCOUNTER — Ambulatory Visit: Payer: Self-pay | Admitting: Family Medicine

## 2020-01-21 ENCOUNTER — Ambulatory Visit (INDEPENDENT_AMBULATORY_CARE_PROVIDER_SITE_OTHER): Payer: Self-pay | Admitting: Family Medicine

## 2020-01-21 VITALS — BP 122/80 | HR 75 | Temp 99.0°F | Ht 65.0 in

## 2020-01-21 DIAGNOSIS — R220 Localized swelling, mass and lump, head: Secondary | ICD-10-CM | POA: Insufficient documentation

## 2020-01-21 DIAGNOSIS — L661 Lichen planopilaris: Secondary | ICD-10-CM

## 2020-01-21 MED ORDER — FLUOCINONIDE 0.05 % EX CREA
1.0000 | TOPICAL_CREAM | Freq: Two times a day (BID) | CUTANEOUS | 0 refills | Status: AC
Start: 2020-01-21 — End: 2020-02-04

## 2020-01-21 NOTE — Progress Notes (Signed)
    SUBJECTIVE:   CHIEF COMPLAINT / HPI:   Bumps on scalp: Patient presents to clinic with a concern that started a couple months ago.  She reports that in November 2021 before she went to the salon to get her hair we twisted she noticed a couple of bumps on her scalp on the front right margin that had appeared.  Since then, the bumps have grown in size and number, there are about 20 of them dotting the front margin of her scalp extending towards the parietal region.  She denies any pain, itching, flaking, bleeding, or secretions coming from the bumps.  She states they are bothersome to her.  She has not tried any new hair products or chemicals.  PERTINENT  PMH / PSH: Noncontributory   OBJECTIVE:   BP 122/80   Pulse 75   Temp 99 F (37.2 C) (Oral)   Ht 5\' 5"  (1.651 m)   SpO2 99%   BMI 53.58 kg/m    General: Well-appearing, pleasant patient Scalp: Multiple firm, nonfluctuant, immobile papules that are skin tone appreciated to patient's scalp without any surrounding erythema, crusting, dryness, bleeding, or discharge         ASSESSMENT/PLAN:   Scalp lump Patient with about 20 firm skin tone and nodules on her scalp.  Not consistent with scalp acne, folliculitis, psoriasis, pilar cyst, angiolymphoid hyperplasia or tinea capitis. Cannot rule out cylindroma, xanthogranuloma. -Patient prescribed fluocinonide 0.05% topical cream to be applied to the scalp twice daily for 2 weeks. -Patient to follow-up in 2-3 weeks if the issue persists -Please consider punch biopsy of a lesion if it is no better     Carla Hodges, [place]

## 2020-01-21 NOTE — Patient Instructions (Signed)
Thank you for coming in to see [place] today! Please see below to review our plan for today's visit:  1. You might be experiencing a case of Lichen planopilaris which is relatively rare, but responds well to topical steroids. I have prescribed for you Fluocinonide cream to be applied to your scalp twice daily for the next 2 weeks.   Please call the clinic at [phone] if your symptoms worsen or you have any concerns. It was our pleasure to serve you!   Dr. Milus Banister [facility]

## 2020-01-21 NOTE — Assessment & Plan Note (Addendum)
Patient with about 20 firm skin tone and nodules on her scalp.  Not consistent with scalp acne, folliculitis, psoriasis, pilar cyst, angiolymphoid hyperplasia or tinea capitis. Cannot rule out cylindroma, xanthogranuloma. -Patient prescribed fluocinonide 0.05% topical cream to be applied to the scalp twice daily for 2 weeks. -Patient to follow-up in 2-3 weeks if the issue persists -Please consider punch biopsy of a lesion if it is no better

## 2020-02-21 ENCOUNTER — Telehealth: Payer: Self-pay

## 2020-02-21 ENCOUNTER — Other Ambulatory Visit: Payer: Self-pay | Admitting: Family Medicine

## 2020-02-21 DIAGNOSIS — B9689 Other specified bacterial agents as the cause of diseases classified elsewhere: Secondary | ICD-10-CM

## 2020-02-21 DIAGNOSIS — N760 Acute vaginitis: Secondary | ICD-10-CM

## 2020-02-21 MED ORDER — CLINDAMYCIN HCL 300 MG PO CAPS: 300.0000 mg | ORAL_CAPSULE | Freq: Two times a day (BID) | ORAL | 1 refills | Status: DC

## 2020-02-21 NOTE — Telephone Encounter (Signed)
Medication sent in. 

## 2020-02-21 NOTE — Telephone Encounter (Signed)
Patient calls nurse line requesting medication for BV. Patient reports that she gets recurrent BV and would like to avoid coming into the office if possible. This is documented on patient's current problem list. Patient reports vaginal discharge with fishy odor.   Please advise if medication can be sent to pharmacy.   Talbot Grumbling, RN

## 2020-02-21 NOTE — Progress Notes (Deleted)
    SUBJECTIVE:   CHIEF COMPLAINT / HPI: bacterial vaginosis symptoms   BV  Patient recently started on PO clindamycin treatment for BV. Recurrent episodes. Patient reports ***   PERTINENT  PMH / PSH: ***  OBJECTIVE:   There were no vitals taken for this visit.  ***  ASSESSMENT/PLAN:   No problem-specific Assessment & Plan notes found for this encounter.     Eulis Foster, MD [place]

## 2020-02-22 ENCOUNTER — Ambulatory Visit: Payer: Self-pay

## 2020-03-13 ENCOUNTER — Telehealth: Payer: Self-pay

## 2020-03-13 NOTE — Telephone Encounter (Signed)
Patient calls nurse line requesting metronidazole for recurrent BV. Patient reports symptoms for approx 2 days. States that she noticed stronger fishy odor yesterday. Patient has also tried boric acid suppositories with no improvement.   Patient has limited availability to come into the office due to working two jobs.   If appropriate, patient would like medication sent to [facility] on [address]. Patient is also requesting diflucan as she frequently gets yeast infections after taking metronidazole.   To PCP  Please advise.   Talbot Grumbling, RN

## 2020-03-14 ENCOUNTER — Other Ambulatory Visit: Payer: Self-pay

## 2020-03-14 ENCOUNTER — Ambulatory Visit (INDEPENDENT_AMBULATORY_CARE_PROVIDER_SITE_OTHER): Payer: 59 | Admitting: Family Medicine

## 2020-03-14 VITALS — BP 122/80 | HR 68 | Wt 319.2 lb

## 2020-03-14 DIAGNOSIS — L661 Lichen planopilaris: Secondary | ICD-10-CM

## 2020-03-14 DIAGNOSIS — N898 Other specified noninflammatory disorders of vagina: Secondary | ICD-10-CM

## 2020-03-14 DIAGNOSIS — N760 Acute vaginitis: Secondary | ICD-10-CM | POA: Diagnosis not present

## 2020-03-14 DIAGNOSIS — B9689 Other specified bacterial agents as the cause of diseases classified elsewhere: Secondary | ICD-10-CM

## 2020-03-14 DIAGNOSIS — R220 Localized swelling, mass and lump, head: Secondary | ICD-10-CM | POA: Diagnosis not present

## 2020-03-14 LAB — POCT WET PREP (WET MOUNT)
Clue Cells Wet Prep Whiff POC: POSITIVE
Trichomonas Wet Prep HPF POC: ABSENT

## 2020-03-14 MED ORDER — FLUCONAZOLE 150 MG PO TABS: 150.0000 mg | ORAL_TABLET | Freq: Once | ORAL | 0 refills | Status: AC

## 2020-03-14 MED ORDER — METRONIDAZOLE 500 MG PO TABS: 500.0000 mg | ORAL_TABLET | Freq: Two times a day (BID) | ORAL | 0 refills | Status: DC

## 2020-03-14 MED ORDER — METRONIDAZOLE 0.75 % VA GEL: 1.0000 | VAGINAL | 6 refills | Status: DC

## 2020-03-14 NOTE — Telephone Encounter (Signed)
Call patient to check in, noted that she saw Dr. Caron Presume this morning for vaginal complaints.  Not surprisingly recurrent BV.  Discussed scheduling appointment when she can for IUD removal/alternative contraception to see if this has impact.  Patriciaann Clan, DO

## 2020-03-14 NOTE — Progress Notes (Signed)
    SUBJECTIVE:   CHIEF COMPLAINT / HPI:   Vaginal discharge  Patient reports that she has vaginal discharge consistent with her normal bacterial vaginosis.  She is sexually active.  She has an IUD.  She reports the symptoms are just like her previous infections and she has BV infections almost every month.  She has not tried any prophylaxis that she could remember.  She does acknowledge that she has tried MetroGel for treatment of BV but she did not like the MetroGel daily.  Derm concerns  Seen previously for issues regarding lesions on her scalp.  She was prescribed medications which she took as prescribed and the lesions have worsened.  Denies any pain, discharge, itching but she is still concerned regarding these lesions.  She would like a referral for dermatology.   OBJECTIVE:   BP 122/80   Pulse 68   Wt (!) 319 lb 3.2 oz (144.8 kg)   SpO2 96%   BMI 53.12 kg/m   General: Well-appearing, no acute distress Respiratory: Normal work of breathing Derm: Raised lesions sporadically throughout her scalp predominantly in the anterior aspect of her head.  See images below. GU: (Medical assistant present for this exam) normal external vagina, normal vaginal tissue without erythema, cervix nonirritated nonfriable with IUD strings present, moderate amount of white vaginal discharge       ASSESSMENT/PLAN:   BV (bacterial vaginosis) 40 year old female history of recurrent bacterial vaginosis with several days of vaginal discharge and odor.  Presentation is similar to previous bacterial vaginosis.  Wet prep positive for +2 clue cells.  Per chart review she has tried clindamycin in the past because of recurrent nature of her BV.  Not interested in STD testing. -Wet prep performed showing BV -Prescribed metronidazole 500 mg twice daily for 7 days as well as Diflucan for use afterwards due to yeast infections that the patient commonly gets -Prescribed MetroGel which she should use twice weekly  after this acute infection is treated for prophylaxis -Patient is going to schedule an appointment with Dr. Higinio Plan for Nexplanon placement and IUD removal  Scalp lump Scalp lesions have gotten worse since her previous visit in January of this year.  Not consistent with scalp acne or folliculitis.  Patient was prescribed fluocinonide topical cream without any resolution. -Referral placed for dermatology -Follow-up as needed     Gifford Shave, MD [place]

## 2020-03-14 NOTE — Patient Instructions (Signed)
It was a pleasure seeing you today.  Your wet prep was positive for bacterial vaginosis.  I have sent a prescription for metronidazole twice daily for 7 days to your pharmacy.  I also sent a prescription for vaginal MetroGel which she will start after the treatment for the acute infection.  You will use this twice weekly for 6 months to help prevent recurrences.  Please let me know if you have any questions or concerns.  I have also sent a referral to dermatology for your scalp lesions.  They will be contacting you to schedule an appointment for this.  I have have a wonderful afternoon!

## 2020-03-15 NOTE — Assessment & Plan Note (Signed)
Scalp lesions have gotten worse since her previous visit in January of this year.  Not consistent with scalp acne or folliculitis.  Patient was prescribed fluocinonide topical cream without any resolution. -Referral placed for dermatology -Follow-up as needed

## 2020-03-15 NOTE — Assessment & Plan Note (Signed)
40 year old female history of recurrent bacterial vaginosis with several days of vaginal discharge and odor.  Presentation is similar to previous bacterial vaginosis.  Wet prep positive for +2 clue cells.  Per chart review she has tried clindamycin in the past because of recurrent nature of her BV.  Not interested in STD testing. -Wet prep performed showing BV -Prescribed metronidazole 500 mg twice daily for 7 days as well as Diflucan for use afterwards due to yeast infections that the patient commonly gets -Prescribed MetroGel which she should use twice weekly after this acute infection is treated for prophylaxis -Patient is going to schedule an appointment with Dr. Higinio Plan for Nexplanon placement and IUD removal

## 2020-05-12 ENCOUNTER — Ambulatory Visit (INDEPENDENT_AMBULATORY_CARE_PROVIDER_SITE_OTHER): Payer: 59 | Admitting: Family Medicine

## 2020-05-12 ENCOUNTER — Encounter: Payer: Self-pay | Admitting: Family Medicine

## 2020-05-12 ENCOUNTER — Ambulatory Visit
Admission: RE | Admit: 2020-05-12 | Discharge: 2020-05-12 | Disposition: A | Discharge status: Home / Self Care | Payer: 59 | Source: Ambulatory Visit | Attending: Family Medicine | Admitting: Family Medicine

## 2020-05-12 ENCOUNTER — Ambulatory Visit: Payer: 59 | Admitting: Family Medicine

## 2020-05-12 ENCOUNTER — Other Ambulatory Visit: Payer: Self-pay

## 2020-05-12 VITALS — BP 124/84 | HR 82 | Ht 65.0 in | Wt 314.2 lb

## 2020-05-12 DIAGNOSIS — R220 Localized swelling, mass and lump, head: Secondary | ICD-10-CM

## 2020-05-12 DIAGNOSIS — R229 Localized swelling, mass and lump, unspecified: Secondary | ICD-10-CM

## 2020-05-12 DIAGNOSIS — M25532 Pain in left wrist: Secondary | ICD-10-CM

## 2020-05-12 DIAGNOSIS — N946 Dysmenorrhea, unspecified: Secondary | ICD-10-CM

## 2020-05-12 MED ORDER — IBUPROFEN 800 MG PO TABS
800.0000 mg | ORAL_TABLET | Freq: Three times a day (TID) | ORAL | 0 refills | Status: DC | PRN
Start: 2020-05-12 — End: 2020-08-24

## 2020-05-12 NOTE — Patient Instructions (Addendum)
It was great seeing you today!  Return on June 2nd at 1:30 PM for a skin biopsy.   Be sure to stop by to get your xray of your wrist. Take Ibuprofen for pain.   Visit Remembers: - Continue to work on your healthy eating habits and incorporating exercise into your daily life.   Regarding lab work today:  Due to recent changes in healthcare laws, you may see the results of your imaging and laboratory studies on MyChart before your provider has had a chance to review them.  I understand that in some cases there may be results that are confusing or concerning to you. Not all laboratory results come back in the same time frame and you may be waiting for multiple results in order to interpret others.  Please give [place] 72 hours in order for your provider to thoroughly review all the results before contacting the office for clarification of your results. If everything is normal, you will get a letter in the mail or a message in My Chart. Please give [place] a call if you do not hear from [place] after 2 weeks.  Please bring all of your medications with you to each visit.    If you haven't already, sign up for My Chart to have easy access to your labs results, and communication with your primary care physician.  Feel free to call with any questions or concerns at any time, at [phone].   Take care,  Dr. Rushie [facility]

## 2020-05-12 NOTE — Progress Notes (Signed)
   SUBJECTIVE:   CHIEF COMPLAINT / HPI:   Chief Complaint  Patient presents with  . skin     Carla Hodges is a 40 y.o. female here for left wrist pain and lumps on her skin.   Pt reports skin lumps appeared after COVID mostly in her scalp.  He tried the topical medication prescribed previously but it did not help.  About a month ago more spots showed up on her chest and left leg.  Denies itching, pain, bleeding or secretions from the lumps.  No one else at home has similar symptoms.  She does have tattoos on her chest and leg but she also has the same lesions in her scalp.  She has an upcoming appointment with dermatology in June.  Reports her and her boyfriend were were playing and he twisted her left wrist a couple days ago.  She has wrist swelling and pain.  She tried a Ace wrap without relief.   PERTINENT  PMH / PSH: reviewed and updated as appropriate   OBJECTIVE:   BP 124/84   Pulse 82   Ht 5\' 5"  (1.651 m)   Wt (!) 314 lb 4 oz (142.5 kg)   SpO2 100%   BMI 52.29 kg/m    GEN: pleasant well-appearing, in no acute distress CV: Well-perfused RESP: no increased work of breathing MSK: Wrist, left:  Inspection yielded no erythema, ecchymosis, bony deformity.  There is mild anterior swelling. ROM full with good flexion and extension and ulnar/radial deviation that is symmetrical with opposite wrist. Palpation is normal over metacarpals, scaphoid and lunate; tendons without tenderness. Strength 5/5 in all directions without pain. Negative Finkelstein, and Tinel's. SKIN: warm, dry        ASSESSMENT/PLAN:   Left wrist pain Mechanism unclear.  There is some anterior metacarpal swelling on exam.  Denies IPV.  Suspect tendinitis.  Obtain left wrist x-rays.  Ibuprofen for pain.  Continue Ace wrap.  Follow-up if not improving.  Skin lumps Obtain lipid panel as these could be xanthomas. Body mass index is 52.29 kg/m.  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 Scheduled for biopsy on on 05/24/2020 at [facility].  She has a dermatology appointment on June 14.     Lyndee Hensen, DO PGY-2, [facility] 05/12/2020

## 2020-05-13 LAB — LIPID PANEL
Chol/HDL Ratio: 2.5 ratio (ref 0.0–4.4)
Cholesterol, Total: 138 mg/dL (ref 100–199)
HDL: 55 mg/dL (ref >39)
LDL Chol Calc (NIH): 71 mg/dL (ref 0–99)
Triglycerides: 57 mg/dL (ref 0–149)
VLDL Cholesterol Cal: 12 mg/dL (ref 5–40)

## 2020-05-14 DIAGNOSIS — M25532 Pain in left wrist: Secondary | ICD-10-CM | POA: Insufficient documentation

## 2020-05-14 DIAGNOSIS — R229 Localized swelling, mass and lump, unspecified: Secondary | ICD-10-CM | POA: Insufficient documentation

## 2020-05-14 NOTE — Assessment & Plan Note (Signed)
Mechanism unclear.  There is some anterior metacarpal swelling on exam.  Denies IPV.  Suspect tendinitis.  Obtain left wrist x-rays.  Ibuprofen for pain.  Continue Ace wrap.  Follow-up if not improving.

## 2020-05-14 NOTE — Assessment & Plan Note (Addendum)
Obtain lipid panel as these could be xanthomas. Body mass index is 52.29 kg/m.  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 Scheduled for biopsy on on 05/24/2020 at [facility].  She has a dermatology appointment on June 14.

## 2020-05-15 ENCOUNTER — Ambulatory Visit: Payer: 59 | Admitting: Family Medicine

## 2020-05-24 ENCOUNTER — Other Ambulatory Visit: Payer: Self-pay

## 2020-05-24 ENCOUNTER — Ambulatory Visit (INDEPENDENT_AMBULATORY_CARE_PROVIDER_SITE_OTHER): Payer: 59 | Admitting: Family Medicine

## 2020-05-24 ENCOUNTER — Encounter: Payer: Self-pay | Admitting: Family Medicine

## 2020-05-24 VITALS — BP 121/88 | HR 78 | Ht 65.0 in | Wt 309.4 lb

## 2020-05-24 DIAGNOSIS — R229 Localized swelling, mass and lump, unspecified: Secondary | ICD-10-CM

## 2020-05-24 NOTE — Assessment & Plan Note (Addendum)
Etiology unclear. Lipid panel was unremarkable. Doubt xanthomas. No purulent material to suggest infectious process. Possibly granulomatous lesion 2/2 to tattoo ink.  Biopsy obtained today. Patient to follow up with dermatology as scheduled in early June.

## 2020-05-24 NOTE — Patient Instructions (Addendum)
It was great seeing you today!  You can take over the counter pain medications, if needed.  Follow up with dermatology as scheduled.    If you have questions or concerns please do not hesitate to call at [phone].  Dr. [facility]     Skin Biopsy, Care After This sheet gives you information about how to care for yourself after your procedure. Your health care provider may also give you more specific instructions. If you have problems or questions, contact your health care provider. What can I expect after the procedure? After the procedure, it is common to have:  Soreness.  Bruising.  Itching. Follow these instructions at home: Biopsy site care Follow instructions from your health care provider about how to take care of your biopsy site. Make sure you:  Wash your hands with soap and water before and after you change your bandage (dressing). If soap and water are not available, use hand sanitizer.  Apply ointment on your biopsy site as directed by your health care provider.  Change your dressing as told by your health care provider.  Leave stitches (sutures), skin glue, or adhesive strips in place. These skin closures may need to stay in place for 2 weeks or longer. If adhesive strip edges start to loosen and curl up, you may trim the loose edges. Do not remove adhesive strips completely unless your health care provider tells you to do that.  If the biopsy area bleeds, apply gentle pressure for 10 minutes. Check your biopsy site every day for signs of infection. Check for:  Redness, swelling, or pain.  Fluid or blood.  Warmth.  Pus or a bad smell.   General instructions  Rest and then return to your normal activities as told by your health care provider.  Take over-the-counter and prescription medicines only as told by your health care provider.  Keep all follow-up visits as told by your health care provider. This is  important. Contact a health care provider if:  You have redness, swelling, or pain around your biopsy site.  You have fluid or blood coming from your biopsy site.  Your biopsy site feels warm to the touch.  You have pus or a bad smell coming from your biopsy site.  You have a fever.  Your sutures, skin glue, or adhesive strips loosen or come off sooner than expected. Get help right away if:  You have bleeding that does not stop with pressure or a dressing. Summary  After the procedure, it is common to have soreness, bruising, and itching at the site.  Follow instructions from your health care provider about how to take care of your biopsy site.  Check your biopsy site every day for signs of infection.  Contact a health care provider if you have redness, swelling, or pain around your biopsy site, or your biopsy site feels warm to the touch.  Keep all follow-up visits as told by your health care provider. This is important. This information is not intended to replace advice given to you by your health care provider. Make sure you discuss any questions you have with your health care provider. Document Revised: 06/23/2017 Document Reviewed: 06/23/2017 Elsevier Patient Education  [place].

## 2020-05-24 NOTE — Progress Notes (Signed)
   SUBJECTIVE:   CHIEF COMPLAINT / HPI:      Carla Hodges is a 40 y.o. female here for skin lesions follow up.    PERTINENT  PMH / PSH: reviewed and updated as appropriate   OBJECTIVE:   BP 121/88   Pulse 78   Ht 5\' 5"  (1.651 m)   Wt (!) 309 lb 6.4 oz (140.3 kg)   SpO2 97%   BMI 51.49 kg/m    GEN: well appearing female in no acute distress  CVS: well perfused  RESP: speaking in full sentences without pause, no respiratory distress  SKIN: warm, dry, raised papules on hairline, chest and left lower leg (see previous imaging)   ASSESSMENT/PLAN:   Skin lumps Etiology unclear. Lipid panel was unremarkable. Doubt xanthomas. No purulent material to suggest infectious process. Possibly granulomatous lesion 2/2 to tattoo ink.  Biopsy obtained today. Patient to follow up with dermatology as scheduled in early June.      Lyndee Hensen, DO PGY-2, [facility] 05/24/2020

## 2020-06-02 ENCOUNTER — Telehealth: Payer: Self-pay | Admitting: Family Medicine

## 2020-06-02 NOTE — Telephone Encounter (Signed)
Called patient to discuss biopsy results. Recommended follow up with dermatology as scheduled on 6/14. Also scheduled for follow up and potential sarcoidosis evaluation depending on diagnosis/recommendations from dermatology appt.

## 2020-06-06 ENCOUNTER — Telehealth: Payer: Self-pay

## 2020-06-06 NOTE — Telephone Encounter (Signed)
Patient calls nurse line reporting swollen left foot, very painful. Patient reports she has a history of this with unknown cause. Patient states she had an order for imaging, however she never went and it has now expired. Patent states she works two jobs and can not come in to "rediscuss." Will forward to PCP to advise on imaging order.

## 2020-06-07 NOTE — Telephone Encounter (Signed)
Sounds suspicious for gout.  If she did not have any injury or trauma to the area, do not expect the x-rays to be of much utility at this point.  She can start with scheduling naproxen 500 mg twice daily or ibuprofen 800 mg 3 times daily for the next 4-5 days.  She should continue to elevate when she can and ice the area.  If it is not getting better in the next 1-2 days or worse including swelling going up into her leg, fever/persistent redness, difficulty breathing, or any chest pain then she needs to be seen immediately.   Please let patient know and I will try to call her later this week to check-in.  Patriciaann Clan, DO

## 2020-06-07 NOTE — Telephone Encounter (Signed)
Informed patient of advice from PCP.  Carla Hodges, [place]

## 2020-06-08 ENCOUNTER — Ambulatory Visit: Payer: 59

## 2020-06-27 ENCOUNTER — Encounter: Payer: Self-pay | Admitting: Family Medicine

## 2020-06-27 ENCOUNTER — Ambulatory Visit (INDEPENDENT_AMBULATORY_CARE_PROVIDER_SITE_OTHER): Payer: 59 | Admitting: Family Medicine

## 2020-06-27 ENCOUNTER — Other Ambulatory Visit: Payer: Self-pay

## 2020-06-27 VITALS — BP 128/70 | Ht 65.0 in | Wt 305.0 lb

## 2020-06-27 DIAGNOSIS — M79672 Pain in left foot: Secondary | ICD-10-CM

## 2020-06-27 DIAGNOSIS — N760 Acute vaginitis: Secondary | ICD-10-CM

## 2020-06-27 DIAGNOSIS — B9689 Other specified bacterial agents as the cause of diseases classified elsewhere: Secondary | ICD-10-CM | POA: Diagnosis not present

## 2020-06-27 DIAGNOSIS — R229 Localized swelling, mass and lump, unspecified: Secondary | ICD-10-CM

## 2020-06-27 NOTE — Assessment & Plan Note (Signed)
Dorsal aspect only, intermittent for at least the past 2 years.  Previously seen by podiatry for this concern, felt related to flatfoot and left capsulitis.  Recommended appropriate shoe wear and Voltaren gel at that time, we re-discussed appropriate shoe wear today as this is likely contributing.

## 2020-06-27 NOTE — Progress Notes (Signed)
    SUBJECTIVE:   CHIEF COMPLAINT / HPI: Discuss derm/sarcoid   Carla Hodges is a 40 year old female presenting for follow-up of recent dermatology biopsy.  Derm: She started noticing raised papules along her hairline and chest/left lower leg within her tattoos around October 2021 and has noticed more location since that time.  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   She also followed up with [facility] about 2 weeks ago, they performed a biopsy in one of her scalp lesions and performed a checks x-ray.  She does not know the results of either.  She has no pulmonary symptoms including no dyspnea on exertion, shortness of breath, chest pain, chronic cough, or sputum production.  She has 1 cousin on her grandfather side that has sarcoidosis.  Recurrent BV: She also reports that for the past few months she has been using vegan condoms and with that has had no problems with vaginal discharge.  Foot pain: Briefly mentioned the end of visit.  She also reports that the more severe left foot pain had resolved, however she has had this intermittently chronically for the past 2 years at least.  Usually wears crocs for shoe wear.  Wants to know if there is anything she should do for it.  PERTINENT  PMH / PSH: Recurrent BV, IUD in place, anxiety/depression, elevated BMI  OBJECTIVE:   BP 128/70   Ht 5\' 5"  (1.651 m)   Wt (!) 305 lb (138.3 kg)   LMP 04/11/2020   BMI 50.75 kg/m    General: Alert, NAD HEENT: NCAT, MMM Lungs: No increased WOB  Msk: Moves all extremities spontaneously  Ext: Warm, dry, 2+ distal pulses (DP/PT), no edema.  Nontender to palpation of the left dorsal foot with no current swelling or ecchymoses.  Full ankle ROM.  Flat feet present. Derm: Raised flesh-colored papular like lesions present within the lines of tattoos on chest and left lower leg.   Additionally a few scattered lesions on the left portion of her scalp.  See images in media.   ASSESSMENT/PLAN:   Foot pain, left Dorsal aspect only, intermittent for at least the past 2 years.  Previously seen by podiatry for this concern, felt related to flatfoot and left capsulitis.  Recommended appropriate shoe wear and Voltaren gel at that time, we re-discussed appropriate shoe wear today as this is likely contributing.  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  She is also seeing dermatology for these, recently had biopsy of her scalp (no tattoos present there) performed with unknown results.  Requested records from their office today (including CXR they obtained as well), as if the scalp lesions are not the same the two on her chest and lower leg could be considered as tattoo granulomas only.  Fortunately no additional symptoms suggestive of sarcoid, especially pulmonary, however will obtain CMP, CBC, and ACE level today.  BV (bacterial vaginosis) Previously recurrent, however fortunately has had success with new brand of condoms.  Encouraged continued use, follow-up as needed.    Follow-up pending labs/records.  Patriciaann Clan, [place]

## 2020-06-27 NOTE — Assessment & Plan Note (Signed)
Previously recurrent, however fortunately has had success with new brand of condoms.  Encouraged continued use, follow-up as needed.

## 2020-06-27 NOTE — Assessment & Plan Note (Signed)
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  She is also seeing dermatology for these, recently had biopsy of her scalp (no tattoos present there) performed with unknown results.  Requested records from their office today (including CXR they obtained as well), as if the scalp lesions are not the same the two on her chest and lower leg could be considered as tattoo granulomas only.  Fortunately no additional symptoms suggestive of sarcoid, especially pulmonary, however will obtain CMP, CBC, and ACE level today.

## 2020-06-27 NOTE — Patient Instructions (Signed)
It was wonderful to see you today.  We will be getting records from [facility] dermatology to see what has happened at their office thus far.  We will additionally get some labs today.  I will give you a call or send you a MyChart message.

## 2020-06-28 LAB — CBC WITH DIFFERENTIAL/PLATELET
Basophils Absolute: 0.1 10*3/uL (ref 0.0–0.2)
Basos: 1 %
EOS (ABSOLUTE): 0.3 10*3/uL (ref 0.0–0.4)
Eos: 4 %
Hematocrit: 38.3 % (ref 34.0–46.6)
Hemoglobin: 12.6 g/dL (ref 11.1–15.9)
Immature Grans (Abs): 0 10*3/uL (ref 0.0–0.1)
Immature Granulocytes: 0 %
Lymphocytes Absolute: 2.3 10*3/uL (ref 0.7–3.1)
Lymphs: 33 %
MCH: 29 pg (ref 26.6–33.0)
MCHC: 32.9 g/dL (ref 31.5–35.7)
MCV: 88 fL (ref 79–97)
Monocytes Absolute: 0.8 10*3/uL (ref 0.1–0.9)
Monocytes: 11 %
Neutrophils Absolute: 3.5 10*3/uL (ref 1.4–7.0)
Neutrophils: 51 %
Platelets: 290 10*3/uL (ref 150–450)
RBC: 4.34 x10E6/uL (ref 3.77–5.28)
RDW: 12.8 % (ref 11.7–15.4)
WBC: 6.9 10*3/uL (ref 3.4–10.8)

## 2020-06-28 LAB — COMPREHENSIVE METABOLIC PANEL
ALT: 12 IU/L (ref 0–32)
AST: 14 IU/L (ref 0–40)
Albumin/Globulin Ratio: 1.7 (ref 1.2–2.2)
Albumin: 4 g/dL (ref 3.8–4.8)
Alkaline Phosphatase: 84 IU/L (ref 44–121)
BUN/Creatinine Ratio: 9 (ref 9–23)
BUN: 8 mg/dL (ref 6–20)
Bilirubin Total: 0.5 mg/dL (ref 0.0–1.2)
CO2: 27 mmol/L (ref 20–29)
Calcium: 8.8 mg/dL (ref 8.7–10.2)
Chloride: 104 mmol/L (ref 96–106)
Creatinine, Ser: 0.9 mg/dL (ref 0.57–1.00)
Globulin, Total: 2.4 g/dL (ref 1.5–4.5)
Glucose: 89 mg/dL (ref 65–99)
Potassium: 4.1 mmol/L (ref 3.5–5.2)
Sodium: 141 mmol/L (ref 134–144)
Total Protein: 6.4 g/dL (ref 6.0–8.5)
eGFR: 83 mL/min/{1.73_m2} (ref >59)

## 2020-06-28 LAB — ANGIOTENSIN CONVERTING ENZYME: Angio Convert Enzyme: 90 U/L — ABNORMAL HIGH (ref 14–82)

## 2020-07-06 ENCOUNTER — Telehealth: Payer: Self-pay | Admitting: Family Medicine

## 2020-07-06 NOTE — Telephone Encounter (Signed)
Received fax from [facility] with results of her scalp biopsy.  Biopsy also showed noncaseating granulomatosis reaction, overall consistent with sarcoid.  There is no tattoo present in this biopsy area, as the biopsy of her lower leg was with in tattoo but also showing noncaseating granulomas.  She had an x-ray ordered by her dermatologist, however cannot find it in the records sent over.  She would benefit from a ophthalmology and pulmonology evaluation with biopsy sarcoidosis.  But fortunately otherwise asymptomatic.  Attempted to reach patient today to discuss recent fax, however no answer and left voicemail.  Forwarding this note to her new primary provider, Dr. Joelyn Oms, to follow-up as appropriate.  Patriciaann Clan, DO

## 2020-07-07 ENCOUNTER — Other Ambulatory Visit: Payer: Self-pay | Admitting: Student

## 2020-07-07 ENCOUNTER — Telehealth: Payer: Self-pay | Admitting: Student

## 2020-07-07 DIAGNOSIS — D863 Sarcoidosis of skin: Secondary | ICD-10-CM

## 2020-07-07 DIAGNOSIS — R229 Localized swelling, mass and lump, unspecified: Secondary | ICD-10-CM

## 2020-07-07 HISTORY — DX: Sarcoidosis of skin: D86.3

## 2020-07-07 NOTE — Telephone Encounter (Signed)
See follow up by Dr. Joelyn Oms. Referred Dorris Singh, MD  Family Medicine Teaching Service

## 2020-07-07 NOTE — Progress Notes (Signed)
Received skin biopsy results consistent with sarcoidosis. Referring to pulmonology and ophthalmology for further eval  Carla Dubonnet, MD

## 2020-07-07 NOTE — Telephone Encounter (Signed)
Called patient to discuss biopsy results consistent with sarcoidosis. Discussed having her follow-up with both an ophthalmologist and pulmonologist for further evaluation. Referrals have been placed. Introduced myself to patient as her new PCP and offered a follow-up visit should any questions arise.  Pearla Dubonnet, MD

## 2020-08-24 ENCOUNTER — Other Ambulatory Visit: Payer: Self-pay | Admitting: Family Medicine

## 2020-08-24 DIAGNOSIS — M25532 Pain in left wrist: Secondary | ICD-10-CM

## 2020-09-16 ENCOUNTER — Other Ambulatory Visit: Payer: Self-pay | Admitting: Student

## 2020-09-16 DIAGNOSIS — M25532 Pain in left wrist: Secondary | ICD-10-CM

## 2020-09-26 ENCOUNTER — Other Ambulatory Visit: Payer: Self-pay

## 2020-09-26 ENCOUNTER — Ambulatory Visit (INDEPENDENT_AMBULATORY_CARE_PROVIDER_SITE_OTHER): Payer: 59 | Admitting: Family Medicine

## 2020-09-26 VITALS — BP 117/75 | HR 77 | Ht 65.0 in | Wt 306.2 lb

## 2020-09-26 DIAGNOSIS — N898 Other specified noninflammatory disorders of vagina: Secondary | ICD-10-CM | POA: Diagnosis not present

## 2020-09-26 DIAGNOSIS — N760 Acute vaginitis: Secondary | ICD-10-CM | POA: Diagnosis not present

## 2020-09-26 DIAGNOSIS — B9689 Other specified bacterial agents as the cause of diseases classified elsewhere: Secondary | ICD-10-CM

## 2020-09-26 LAB — POCT WET PREP (WET MOUNT)
Clue Cells Wet Prep Whiff POC: POSITIVE
Trichomonas Wet Prep HPF POC: ABSENT

## 2020-09-26 MED ORDER — METRONIDAZOLE 500 MG PO TABS
500.0000 mg | ORAL_TABLET | Freq: Two times a day (BID) | ORAL | 0 refills | Status: DC
Start: 2020-09-26 — End: 2020-11-27

## 2020-09-26 MED ORDER — FLUCONAZOLE 150 MG PO TABS: 150.0000 mg | ORAL_TABLET | Freq: Once | ORAL | 0 refills | Status: AC

## 2020-09-26 MED ORDER — IBUPROFEN 800 MG PO TABS: ORAL_TABLET | ORAL | 0 refills | Status: DC

## 2020-09-26 NOTE — Assessment & Plan Note (Signed)
Patient with 5-6 days of vaginal odor and increased discharge. Wet prep shows few clue cells, moderate bacteria, and positive whiff consistent with BV. -Rx sent for Metronidazole 500mg  BID x7 days -Given Rx for Diflucan x1 as she often develops yeast after completing BV treatment

## 2020-09-26 NOTE — Progress Notes (Signed)
    SUBJECTIVE:   CHIEF COMPLAINT / HPI:   Vaginal Discharge Patient reports vaginal odor and increased vaginal discharge x5-6 days. Has a history of recurrent BV. States she "knows what it is", and is frustrated she needed to be seen to get the treatment. Since using vegan condoms she has gone several months without a BV flare.  No vaginal itching, no dysuria. Sexually active with 1 female partner, uses condoms and IUD for contraception. Declines STI testing today. She is up-to-date on her pap. Reports she usually gets a yeast infection after finishing the BV treatment, so she is usually prescribed fluconazole. Currently spotting as well (periods are irregular with her IUD)  PERTINENT  PMH / PSH: Recurrent BV, anxiety/depression, elevated BMI  OBJECTIVE:   BP 117/75   Pulse 77   Ht 5\' 5"  (1.651 m)   Wt (!) 306 lb 3.2 oz (138.9 kg)   SpO2 97%   BMI 50.95 kg/m   General: NAD, pleasant, able to participate in exam Respiratory: No respiratory distress Skin: warm and dry, no rashes noted Psych: Normal affect and mood Neuro: grossly intact  GU/GYN: Exam performed in the presence of a chaperone. External genitalia within normal limits.  Vaginal mucosa pink, moist, normal rugae.  Nonfriable cervix without lesions. Small amount of blood in vaginal vault. IUD strings visualized.  ASSESSMENT/PLAN:   BV (bacterial vaginosis) Patient with 5-6 days of vaginal odor and increased discharge. Wet prep shows few clue cells, moderate bacteria, and positive whiff consistent with BV. -Rx sent for Metronidazole 500mg  BID x7 days -Given Rx for Diflucan x1 as she often develops yeast after completing BV treatment     Alcus Dad, MD [place]

## 2020-09-26 NOTE — Patient Instructions (Addendum)
It was great to meet you!  -I have sent a prescription to your pharmacy for the Dexter. The medication is called metronidazole and you should take it twice daily for 7 days. -I have also sent a medication for a yeast infection, although your testing did not show any yeast today. This is called fluconazole (or Diflucan) and is just a one time dose. -Lastly I have refilled your Ibuprofen as needed for cramping.  Take care and seek immediate care sooner if you develop any concerns.  Dr. [facility]

## 2020-10-26 ENCOUNTER — Encounter (HOSPITAL_COMMUNITY): Payer: Self-pay

## 2020-10-26 ENCOUNTER — Other Ambulatory Visit: Payer: Self-pay

## 2020-10-26 ENCOUNTER — Ambulatory Visit (HOSPITAL_COMMUNITY)
Admission: RE | Admit: 2020-10-26 | Discharge: 2020-10-26 | Disposition: A | Discharge status: Home / Self Care | Payer: 59 | Source: Ambulatory Visit | Attending: Urgent Care | Admitting: Urgent Care

## 2020-10-26 VITALS — BP 131/90 | HR 75 | Temp 98.4°F | Resp 20

## 2020-10-26 DIAGNOSIS — R102 Pelvic and perineal pain: Secondary | ICD-10-CM

## 2020-10-26 DIAGNOSIS — R300 Dysuria: Secondary | ICD-10-CM | POA: Diagnosis present

## 2020-10-26 DIAGNOSIS — R1030 Lower abdominal pain, unspecified: Secondary | ICD-10-CM

## 2020-10-26 LAB — POCT URINALYSIS DIPSTICK, ED / UC
Bilirubin Urine: NEGATIVE
Glucose, UA: NEGATIVE mg/dL
Hgb urine dipstick: NEGATIVE
Ketones, ur: NEGATIVE mg/dL
Leukocytes,Ua: NEGATIVE
Nitrite: NEGATIVE
Protein, ur: NEGATIVE mg/dL
Specific Gravity, Urine: 1.02 (ref 1.005–1.030)
Urobilinogen, UA: 0.2 mg/dL (ref 0.0–1.0)
pH: 7.5 (ref 5.0–8.0)

## 2020-10-26 MED ORDER — NAPROXEN 500 MG PO TABS: 500.0000 mg | ORAL_TABLET | Freq: Two times a day (BID) | ORAL | 0 refills | Status: DC

## 2020-10-26 NOTE — ED Provider Notes (Signed)
Carla Hodges   MRN: [id number] DOB: 1980-12-14  Subjective:   Carla Hodges is a 40 y.o. female with PMH of endometriosis, fibroids presenting for 3-day history of lower abdominal pain, pelvic pain, dysuria, urinary frequency.  Patient would like to have her IUD checked.  She does go to a gynecology practice but has not been there in a while.  Has a history of bacterial vaginosis.  Does not want to be tested for STIs.  She does have 1 sex partner, does not use protection.  No vaginal discharge, genital rash.  No current facility-administered medications for this encounter.  Current Outpatient Medications:    albuterol (VENTOLIN HFA) 108 (90 Base) MCG/ACT inhaler, Inhale 2 puffs into the lungs every 4 (four) hours as needed for wheezing or shortness of breath., Disp: 8 g, Rfl: 0   CHORIOGONADOTROPIN ALFA [place], Inject 35 Units into the skin every Monday, Wednesday, and Friday., Disp: , Rfl:    Chorionic Gonadotropin (HCG IJ), Inject as directed. Pt reports taking M, W, F; unsure of dosage, Disp: , Rfl:    hydroxychloroquine (PLAQUENIL) 200 MG tablet, Take 200 mg by mouth 2 (two) times daily., Disp: , Rfl:    ibuprofen (ADVIL) 800 MG tablet, TAKE 1 TABLET BY MOUTH EVERY 8 HOURS AS NEEDED FOR  MILD  PAIN  OR  CRAMPING, Disp: 30 tablet, Rfl: 0   metroNIDAZOLE (FLAGYL) 500 MG tablet, Take 1 tablet (500 mg total) by mouth 2 (two) times daily., Disp: 14 tablet, Rfl: 0   metroNIDAZOLE (METROGEL VAGINAL) 0.75 % vaginal gel, Place 1 Applicatorful vaginally 2 (two) times a week. 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   Tuberculosis  2010   not active     Past Surgical History:  Procedure Laterality Date   HYSTEROSCOPY     2000   LAPAROSCOPY N/A 06/12/2016   Procedure: LAPAROSCOPY DIAGNOSTIC WITH PERITONEAL BIOSPSY;  Surgeon: Emily Filbert, MD;  Location: [facility];  Service: Gynecology;  Laterality: N/A;   LAPAROSCOPY N/A 06/12/2016   Procedure: LAPAROSCOPY DIAGNOSTIC, EVACUATION OF HEMATOMA;  Surgeon: Emily Filbert, MD;  Location: [facility];  Service: Gynecology;  Laterality: N/A;    Family History  Problem Relation Age of Onset   Diabetes Maternal Uncle    Diabetes Maternal Uncle    Alcohol abuse Neg Hx    Arthritis Neg Hx    Asthma Neg Hx    Birth defects Neg Hx    Cancer Neg Hx    COPD Neg Hx    Depression Neg Hx    Drug abuse Neg Hx    Early death Neg Hx    Hearing loss Neg Hx    Heart disease Neg Hx    Hyperlipidemia Neg Hx    Hypertension Neg Hx    Kidney disease Neg Hx    Learning disabilities Neg Hx    Mental illness Neg Hx    Mental retardation Neg Hx    Miscarriages / Stillbirths Neg Hx    Stroke Neg Hx    Vision loss Neg Hx  Social History   Tobacco Use   Smoking status: Former    Types: Cigarettes    Quit date: 04/12/2011    Years since quitting: 9.5   Smokeless tobacco: Never  Substance Use Topics   Alcohol use: No   Drug use: No    ROS   Objective:   Vitals: BP 131/90 (BP Location: Left Wrist)   Pulse 75   Temp 98.4 F (36.9 C) (Oral)   Resp 20   SpO2 100%   Physical Exam Constitutional:      General: She is not in acute distress.    Appearance: Normal appearance. She is well-developed. She is not ill-appearing, toxic-appearing or diaphoretic.  HENT:     Head: Normocephalic and atraumatic.     Nose: Nose normal.     Mouth/Throat:     Mouth: Mucous membranes are moist.     Pharynx: Oropharynx is clear.  Eyes:     General: No scleral icterus.       Right eye: No discharge.        Left eye: No discharge.     Extraocular Movements: Extraocular movements intact.      Conjunctiva/sclera: Conjunctivae normal.     Pupils: Pupils are equal, round, and reactive to light.  Cardiovascular:     Rate and Rhythm: Normal rate.  Pulmonary:     Effort: Pulmonary effort is normal.  Abdominal:     General: Bowel sounds are normal. There is no distension.     Palpations: Abdomen is soft. There is no mass.     Tenderness: There is abdominal tenderness (lower/pelvic). There is no right CVA tenderness, left CVA tenderness, guarding or rebound.  Skin:    General: Skin is warm and dry.  Neurological:     General: No focal deficit present.     Mental Status: She is alert and oriented to person, place, and time.  Psychiatric:        Mood and Affect: Mood normal.        Behavior: Behavior normal.        Thought Content: Thought content normal.        Judgment: Judgment normal.    Results for orders placed or performed during the hospital encounter of 10/26/20 (from the past 24 hour(s))  POC Urinalysis dipstick     Status: None   Collection Time: 10/26/20  9:32 AM  Result Value Ref Range   Glucose, UA NEGATIVE NEGATIVE mg/dL   Bilirubin Urine NEGATIVE NEGATIVE   Ketones, ur NEGATIVE NEGATIVE mg/dL   Specific Gravity, Urine 1.020 1.005 - 1.030   Hgb urine dipstick NEGATIVE NEGATIVE   pH 7.5 5.0 - 8.0   Protein, ur NEGATIVE NEGATIVE mg/dL   Urobilinogen, UA 0.2 0.0 - 1.0 mg/dL   Nitrite NEGATIVE NEGATIVE   Leukocytes,Ua NEGATIVE NEGATIVE    Assessment and Plan :   PDMP not reviewed this encounter.  1. Acute pelvic pain, female   2. Lower abdominal pain   3. Dysuria    Urinalysis negative, urine culture pending.  Advised that she hydrate very well with plain water.  Recommended using naproxen for her lower/pelvic pain for suspected recurrent fibroids, endometriosis.  Testing pending for bacterial vaginosis only has patient has a history of this.  Counseled patient on potential for adverse effects with medications prescribed/recommended today, ER and  return-to-clinic precautions discussed, patient verbalized understanding.    Jaynee Eagles, PA-C 10/26/20 1007

## 2020-10-26 NOTE — ED Triage Notes (Signed)
Pt reports burning when urinating and abdominal pain x 3 days.   Pt is concern for the UID if is in place.

## 2020-10-27 ENCOUNTER — Telehealth: Payer: Self-pay

## 2020-10-27 LAB — CERVICOVAGINAL ANCILLARY ONLY
Bacterial Vaginitis (gardnerella): POSITIVE — AB
Comment: NEGATIVE

## 2020-10-27 MED ORDER — METRONIDAZOLE 500 MG PO TABS: 500.0000 mg | ORAL_TABLET | Freq: Two times a day (BID) | ORAL | 0 refills | Status: DC

## 2020-10-28 LAB — URINE CULTURE: Culture: 30000 — AB

## 2020-11-01 ENCOUNTER — Telehealth: Payer: Self-pay

## 2020-11-01 MED ORDER — NITROFURANTOIN MONOHYD MACRO 100 MG PO CAPS: 100.0000 mg | ORAL_CAPSULE | Freq: Two times a day (BID) | ORAL | 0 refills | Status: DC

## 2020-11-12 ENCOUNTER — Other Ambulatory Visit: Payer: Self-pay | Admitting: Family Medicine

## 2020-11-20 ENCOUNTER — Encounter: Payer: Self-pay | Admitting: Family Medicine

## 2020-11-20 ENCOUNTER — Other Ambulatory Visit: Payer: Self-pay

## 2020-11-20 ENCOUNTER — Ambulatory Visit (INDEPENDENT_AMBULATORY_CARE_PROVIDER_SITE_OTHER): Payer: 59 | Admitting: Family Medicine

## 2020-11-20 VITALS — BP 118/74 | HR 57 | Wt 298.0 lb

## 2020-11-20 DIAGNOSIS — N926 Irregular menstruation, unspecified: Secondary | ICD-10-CM | POA: Diagnosis not present

## 2020-11-20 DIAGNOSIS — N809 Endometriosis, unspecified: Secondary | ICD-10-CM | POA: Diagnosis not present

## 2020-11-20 MED ORDER — IBUPROFEN 800 MG PO TABS: 800.0000 mg | ORAL_TABLET | Freq: Three times a day (TID) | ORAL | 0 refills | Status: DC | PRN

## 2020-11-20 NOTE — Assessment & Plan Note (Signed)
-   she notes irregular bleeding, although it is light, but is most concerned about still having pain with her periods - Discussed several other forms of birth control including Nexplanon and OCPs, after shared decision making she would prefer to keep IUD in for now, continue tracking periods, and referral to OB/GYN to discuss other more permanent options

## 2020-11-20 NOTE — Patient Instructions (Addendum)
It was wonderful to see you today.  Please bring ALL of your medications with you to every visit.   Today we talked about:  - I have refilled your ibuprofen 800 mg for 60 tabs, please only take every 8 hours, maximum of three times a day, and do not take with naproxen, Motrin, or other NSAIDs - I have placed a referral to OB/GYN to discuss other options for your pain control for endometriosis - You can talk to the desk up front about switching to a female PCP   Thank you for choosing [facility].   Please call [phone] with any questions about today's appointment.  Please be sure to schedule follow up at the front  desk before you leave today.   Yehuda Savannah, MD  Family Medicine

## 2020-11-20 NOTE — Assessment & Plan Note (Signed)
-   having continued pain with IUD, strings confirmed last month visualized at urgent care and denies pelvic exam today, refilled ibuprofen 800mg  TID PRN pain, referral to OBGYN to discuss other options for pain control including possible hysterectomy

## 2020-11-20 NOTE — Progress Notes (Signed)
    SUBJECTIVE:   CHIEF COMPLAINT / HPI:   Irregular bleeding-patient states since she had her IUD placed 11/25/2018 she continues to have brown spotting several days for a month.  She has a history of endometriosis and notes that she has pain with her periods and this is most bothersome to her.  She wishes that she did not have periods at all.  She has not been to seen an OB/GYN since her old one retired and would like a new referral.  She is wondering about the Nexplanon and if that is a better option to get rid of her menses altogether.  She denies any heavy bleeding.  She is not sure if the pain is better or worse since she had the IUD placed, but is upset that it is still present.  PERTINENT  PMH / PSH: As above  OBJECTIVE:   BP 118/74   Pulse (!) 57   Wt 298 lb (135.2 kg)   LMP 11/01/2020 (Approximate)   SpO2 99%   BMI 49.59 kg/m   General: alert & oriented, no apparent distress, well groomed HEENT: normocephalic, atraumatic, EOM grossly intact, oral mucosa moist, neck supple Respiratory: normal respiratory effort GI: non-distended Skin: no rashes, no jaundice Psych: appropriate mood and affect   ASSESSMENT/PLAN:   Endometriosis determined by laparoscopy - having continued pain with IUD, strings confirmed last month visualized at urgent care and denies pelvic exam today, refilled ibuprofen 800mg  TID PRN pain, referral to OBGYN to discuss other options for pain control including possible hysterectomy  Irregular bleeding - she notes irregular bleeding, although it is light, but is most concerned about still having pain with her periods - Discussed several other forms of birth control including Nexplanon and OCPs, after shared decision making she would prefer to keep IUD in for now, continue tracking periods, and referral to OB/GYN to discuss other more permanent options     Lenoria Chime, MD [place]

## 2020-11-27 ENCOUNTER — Other Ambulatory Visit: Payer: Self-pay

## 2020-11-27 ENCOUNTER — Ambulatory Visit (INDEPENDENT_AMBULATORY_CARE_PROVIDER_SITE_OTHER): Payer: 59 | Admitting: Internal Medicine

## 2020-11-27 ENCOUNTER — Encounter: Payer: Self-pay | Admitting: Internal Medicine

## 2020-11-27 VITALS — BP 122/76 | HR 67 | Temp 98.2°F | Ht 65.5 in | Wt 298.8 lb

## 2020-11-27 DIAGNOSIS — D869 Sarcoidosis, unspecified: Secondary | ICD-10-CM | POA: Diagnosis not present

## 2020-11-27 NOTE — Patient Instructions (Addendum)
Please schedule follow up scheduled with myself in 12 months.  If my schedule is not open yet, we will contact you with a reminder closer to that time.  Before your next visit I would like you to have: Full set of PFTs - 1 hour, will call you with the results  The website to look for health insurance is www.healthcare.gov  I ordered a blood test - this is a calcium level. Not an emergency, you can have it drawn anytime you get blood drawn next.   Sarcoidosis Sarcoidosis is a disease that can cause inflammation in many areas of the body. It most often affects the lungs (pulmonary sarcoidosis). Sarcoidosis can also affect the lymph nodes, liver, eyes, skin, heart, or any other body tissue. Normally, cells that are part of the body's disease-fighting system (immune system) attack harmful substances in the body, such as germs. This immune system response causes inflammation. After the harmful substance is destroyed, the inflammation goes away. When you have sarcoidosis, your immune system causes inflammation even when there are no harmful substances, and the inflammation does not go away. Sarcoidosis also causes cells from your immune system to form small lumps (granulomas) in the affected area of your body. What are the causes? The exact cause of sarcoidosis is not known.  If you have a family history of this disease (genetic predisposition), the immune system response that leads to inflammation may be triggered by something in your environment, such as: Bacteria or viruses. Metals. Chemicals. Dust. Mold or mildew. What increases the risk? You may be more likely to develop this condition if you: Have a family history of the disease. Are African American. Are of Northern European descent. Are 36-5 years old. Are female. Work as a Airline pilot. Work in an environment where you are exposed to metals, chemicals, mold or mildew, or insecticides. What are the signs or symptoms? Some people with  sarcoidosis have no symptoms. Others have very mild symptoms. The symptoms usually depend on the organ that is affected. Sarcoidosis most often affects the lungs, which may lead to symptoms such as: Chest pain. Coughing. Wheezing. Shortness of breath. Other common symptoms include: Night sweats. Fever. Weight loss. Tiredness (fatigue). Swollen lymph nodes. Joint pain. How is this diagnosed? This condition may be diagnosed based on: Your symptoms and medical history. A physical exam. Imaging tests such as: Chest X-ray. CT scan. MRI. PET scan. Lung function tests. These tests evaluate your breathing and check for problems that may be related to sarcoidosis. A procedure to remove a tissue sample for testing (biopsy). You may have a biopsy of lung tissue if that is where you are having symptoms. You may have tests to check for any complications of the condition. These tests may include: Eye exams. MRI of the heart or brain. Echocardiogram. ECG (electrocardiogram). How is this treated? In some cases, sarcoidosis does not require a specific treatment because it causes no symptoms or only mild symptoms. If your symptoms bother you or are severe, you may be prescribed medicines to reduce inflammation or relieve symptoms. These medicines may include: Prednisone. This is a steroid that reduces inflammation related to sarcoidosis. Hydroxychloroquine. This may be used to treat sarcoidosis that affects the skin, eyes, or brain. Certain medicines that affect the immune system. These can help with sarcoidosis in the joints, eyes, skin, or lungs. Medicines that you breathe in (inhalers). Inhalers can help you breathe if sarcoidosis affects your lungs. Follow these instructions at home:  Do not use any  products that contain nicotine or tobacco. These products include cigarettes, chewing tobacco, and vaping devices, such as e-cigarettes. If you need help quitting, ask your health care  provider. Avoid secondhand smoke and irritating dust or chemicals. Stay indoors on days when air quality is poor in your area. Return to your normal activities as told by your health care provider. Ask your health care provider what activities are safe for you. Take or use over-the-counter and prescription medicines only as told by your health care provider. Keep all follow-up visits. This is important. Where to find more information National Heart, Lung, and Blood Institute: https://wilson-eaton.com/ Contact a health care provider if: You have vision problems. You have a dry cough that does not go away. You have an irregular heartbeat. You have pain or aches in your joints, hands, or feet. You have an unexplained rash. Get help right away if: You have chest pain. You have trouble breathing. These symptoms may represent a serious problem that is an emergency. Do not wait to see if the symptoms will go away. Get medical help right away. Call your local emergency services (911 in the [place] Do not drive yourself to the hospital. Summary Sarcoidosis is a disease that can cause inflammation in many body areas of the body. It most often affects the lungs (pulmonary sarcoidosis). It can also affect the lymph nodes, liver, eyes, skin, heart, or any other body tissue. When you have sarcoidosis, cells from your immune system form small lumps (granulomas) in the affected area of your body. Sarcoidosis sometimes does not require a specific treatment because it causes no symptoms or only mild symptoms. If your symptoms bother you or are severe, you may be prescribed medicines to reduce inflammation or relieve symptoms. This information is not intended to replace advice given to you by your health care provider. Make sure you discuss any questions you have with your health care provider. Document Revised: 10/26/2019 Document Reviewed: 10/26/2019 Elsevier Patient Education  2022 [employer].

## 2020-11-27 NOTE — Progress Notes (Signed)
Carla Hodges    [id number]    [birth year]  Primary Care Physician:Sanford, Dorene Grebe, MD  Referring Physician: Martyn Malay, [place],  [place] [address] Reason for Consultation: sarcoidosis Date of Consultation: 11/27/2020  Chief complaint:   Chief Complaint  Patient presents with   Consult    Pt is here for a consult due to sarcoidosis. Denies any complaints of cough, SOB, or chest discomfort.     HPI: Carla Hodges is a 40 y.o. woman with past medical history of  She was diagnosed with cutaneous sarcoidosis via skin biopsy - dermpath shows noncaseating granulomas which could be a tattoo granuloma if only in the pigmented area, otherwise could be sarcoid. 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   Childhood asthma/bronchitis.   She currently has symptoms which flare up when she is sick with a URI. Usually less than once/year.    Social history:  Occupation: works at a [employer]il [employer], works at [employer].  Exposures: lives at home with no pets.  Smoking history: no smoker, prior marijuana use. Passive smoke exposure in childhood.   Social History   Occupational History   Not on file  Tobacco Use   Smoking status: Never   Smokeless tobacco: Never  Substance and Sexual Activity   Alcohol use: No   Drug use: Yes    Types: Marijuana   Sexual activity: Yes    Birth control/protection: None, Injection    Relevant family history:  Family History  Problem Relation Age of Onset   Diabetes Maternal Uncle    Diabetes Maternal Uncle    Sarcoidosis Cousin    Alcohol abuse Neg Hx    Arthritis Neg Hx    Asthma Neg Hx    Birth defects Neg Hx    Cancer Neg Hx    COPD Neg Hx    Depression Neg Hx    Drug abuse Neg Hx    Early death Neg Hx    Hearing loss Neg Hx    Heart disease Neg Hx    Hyperlipidemia Neg Hx    Hypertension Neg Hx    Kidney disease Neg Hx    Learning disabilities Neg Hx     Mental illness Neg Hx    Mental retardation Neg Hx    Miscarriages / Stillbirths Neg Hx    Stroke Neg Hx    Vision loss Neg Hx     Past Medical History:  Diagnosis Date   Breast nodule 11/06/2015   Bronchitis, chronic (HCC)    Depression    moderate   Fibroids    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5" (1.664 m), weight 298 lb 12.8 oz (135.5 kg), last menstrual period 11/01/2020, SpO2 100 %. Gen:      No acute  distress ENT:  no nasal polyps, mucus membranes moist Lungs:    No increased respiratory effort, symmetric chest wall excursion, clear to auscultation bilaterally, no wheezes or crackles CV:         Regular rate and rhythm; no murmurs, rubs, or gallops.  No pedal edema Abd:      + bowel sounds; soft, non-tender; no distension MSK: no acute synovitis of DIP or PIP joints, no mechanics hands.  Skin:      Warm and dry; no rashes, tattoos noted with papules  Neuro: normal speech, no focal facial asymmetry Psych: alert and oriented x3, normal mood and affect   Data Reviewed/Medical Decision Making:  Independent interpretation of tests: Imaging:  Review of patient's chest xray 2014 images revealed no acute cardiopulmonary process. The patient's images have been independently reviewed by me.    PFTs: No flowsheet data found.  EKG reviewed today: NSR, no conduction  delays  Labs:  Lab Results  Component Value Date   WBC 6.9 06/27/2020   HGB 12.6 06/27/2020   HCT 38.3 06/27/2020   MCV 88 06/27/2020   PLT 290 06/27/2020     Immunization status:  Immunization History  Administered Date(s) Administered   Meningococcal Conjugate 06/23/2012   PFIZER(Purple Top)SARS-COV-2 Vaccination 10/28/2019, 11/24/2019   Td 04/02/2000   Tdap 06/23/2012     I reviewed prior external note(s) from derm, pcp  I reviewed the result(s) of the labs and imaging as noted above.   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 She should continue plaquenil for her cutaneous symptoms which are now better controlled.  Will obtain PFTs to establish baseline lung function  For the diagnosis of sarcoidosis she needs the following  Baseline eye examination to evaluate for ocular sarcoidosis - referral pending Baseline serum creatinine for renal sarcoidosis - wnl June 2022 Baseline serum alkaline phosphatase for hepatic sarcoidosis - wnl June 2022 Baseline serum calcium - ordered Baseline serum complete blood count - wnl in June 2022 Baseline EKG for cardiac sarcoidosis - obtained today: no evidence of intraventricular conduction delay.   Reference: Diagnosis and Detection of Sarcoidosis: An Official American [place], Iss 8, pp e26-e51, Apr 22, 2018  We discussed disease management and progression at length today.    Return to Care: Return in about 1 year (around 11/27/2021).  Lenice Llamas, MD Pulmonary and [place]  CC: Martyn Malay, MD

## 2020-11-28 LAB — CALCIUM: Calcium: 8.9 mg/dL (ref 8.4–10.5)

## 2020-12-07 ENCOUNTER — Ambulatory Visit (INDEPENDENT_AMBULATORY_CARE_PROVIDER_SITE_OTHER): Payer: 59 | Admitting: Internal Medicine

## 2020-12-07 ENCOUNTER — Other Ambulatory Visit: Payer: Self-pay | Admitting: Internal Medicine

## 2020-12-07 ENCOUNTER — Other Ambulatory Visit: Payer: Self-pay

## 2020-12-07 DIAGNOSIS — D869 Sarcoidosis, unspecified: Secondary | ICD-10-CM | POA: Diagnosis not present

## 2020-12-07 LAB — PULMONARY FUNCTION TEST
DL/VA % pred: 132 %
DL/VA: 5.84 ml/min/mmHg/L
DLCO cor % pred: 105 %
DLCO cor: 23.97 ml/min/mmHg
DLCO unc % pred: 105 %
DLCO unc: 23.97 ml/min/mmHg
FEF 25-75 Post: 2.09 L/sec
FEF 25-75 Pre: 1.63 L/sec
FEF2575-%Change-Post: 28 %
FEF2575-%Pred-Post: 71 %
FEF2575-%Pred-Pre: 56 %
FEV1-%Change-Post: 6 %
FEV1-%Pred-Post: 84 %
FEV1-%Pred-Pre: 79 %
FEV1-Post: 2.22 L
FEV1-Pre: 2.08 L
FEV1FVC-%Change-Post: 9 %
FEV1FVC-%Pred-Pre: 90 %
FEV6-%Change-Post: -1 %
FEV6-%Pred-Post: 86 %
FEV6-%Pred-Pre: 87 %
FEV6-Post: 2.7 L
FEV6-Pre: 2.75 L
FEV6FVC-%Pred-Post: 102 %
FEV6FVC-%Pred-Pre: 102 %
FVC-%Change-Post: -2 %
FVC-%Pred-Post: 84 %
FVC-%Pred-Pre: 86 %
FVC-Post: 2.7 L
FVC-Pre: 2.76 L
Post FEV1/FVC ratio: 82 %
Post FEV6/FVC ratio: 100 %
Pre FEV1/FVC ratio: 75 %
Pre FEV6/FVC Ratio: 100 %
RV % pred: 107 %
RV: 1.75 L
TLC % pred: 86 %
TLC: 4.49 L

## 2020-12-07 NOTE — Progress Notes (Signed)
PFT done today. 

## 2021-01-29 ENCOUNTER — Other Ambulatory Visit: Payer: Self-pay

## 2021-01-29 ENCOUNTER — Ambulatory Visit (INDEPENDENT_AMBULATORY_CARE_PROVIDER_SITE_OTHER): Payer: PRIVATE HEALTH INSURANCE | Admitting: Obstetrics & Gynecology

## 2021-01-29 ENCOUNTER — Encounter: Payer: Self-pay | Admitting: Obstetrics & Gynecology

## 2021-01-29 VITALS — BP 115/66 | HR 70 | Wt 301.6 lb

## 2021-01-29 DIAGNOSIS — N809 Endometriosis, unspecified: Secondary | ICD-10-CM | POA: Diagnosis not present

## 2021-01-29 DIAGNOSIS — N921 Excessive and frequent menstruation with irregular cycle: Secondary | ICD-10-CM

## 2021-01-29 DIAGNOSIS — G8929 Other chronic pain: Secondary | ICD-10-CM

## 2021-01-29 DIAGNOSIS — Z975 Presence of (intrauterine) contraceptive device: Secondary | ICD-10-CM

## 2021-01-29 DIAGNOSIS — R102 Pelvic and perineal pain: Secondary | ICD-10-CM | POA: Diagnosis not present

## 2021-01-29 DIAGNOSIS — D219 Benign neoplasm of connective and other soft tissue, unspecified: Secondary | ICD-10-CM

## 2021-01-29 DIAGNOSIS — Z1231 Encounter for screening mammogram for malignant neoplasm of breast: Secondary | ICD-10-CM

## 2021-01-29 MED ORDER — ORILISSA 150 MG PO TABS: 1.0000 | ORAL_TABLET | Freq: Every day | ORAL | 2 refills | Status: DC

## 2021-01-29 MED ORDER — IBUPROFEN 800 MG PO TABS
800.0000 mg | ORAL_TABLET | Freq: Three times a day (TID) | ORAL | 3 refills | Status: DC | PRN
Start: 2021-01-29 — End: 2023-02-17

## 2021-01-29 NOTE — Progress Notes (Signed)
Patient mammogram scheduled for 02/12/21 @ 3:30 PM at the [facility]. Patient pelvic ultrasound scheduled for at 02/05/21 @ 1:30 PM. Patient notified of both.

## 2021-01-29 NOTE — Patient Instructions (Signed)
Do some research about Orilissa vs MyFembree for endometriosis pain and bleeding

## 2021-01-29 NOTE — Progress Notes (Signed)
GYNECOLOGY OFFICE VISIT NOTE  History:   Carla Hodges is a 41 y.o. G1P0010 here today for evaluation and management of irregular spotting on Liletta. Has been in place since 11/25/2018, placed to help with her known endometriosis. Gets regular periods, but also spotting up to 10 days in a month. History of fibroids, last seen on scan done in 2017.  She also reports still having significant endometriosis pain.  She denies any abnormal vaginal discharge or other concerns.    Past Medical History:  Diagnosis Date   Breast nodule 11/06/2015   Bronchitis, chronic (HCC)    Depression    moderate   Fibroids    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/27/2015   Tuberculosis 2010   not active    Past Surgical History:  Procedure Laterality Date   HYSTEROSCOPY     2000   LAPAROSCOPY N/A 06/12/2016   Procedure: LAPAROSCOPY DIAGNOSTIC WITH PERITONEAL BIOSPSY;  Surgeon: Carla Filbert, MD;  Location: [facility];  Service: Gynecology;  Laterality: N/A;   LAPAROSCOPY N/A 06/12/2016   Procedure: LAPAROSCOPY DIAGNOSTIC, EVACUATION OF HEMATOMA;  Surgeon: Carla Filbert, MD;  Location: [facility];  Service: Gynecology;  Laterality: N/A;    The following portions of the patient's history were reviewed and updated as appropriate: allergies, current medications, past family history, past medical history, past social history, past surgical history and problem list.   Health Maintenance:  Normal pap and negative HRHPV on 09/16/2018.   Review of Systems:  Pertinent items noted in HPI and remainder of comprehensive ROS otherwise negative.  Physical Exam:  BP 115/66    Pulse 70    Wt (!) 301 lb 9.6 oz (136.8 kg)    LMP 12/30/2020    BMI 49.43 kg/m  CONSTITUTIONAL: Well-developed, well-nourished female in no acute distress.  HEENT:  Normocephalic, atraumatic. External right and left ear normal. No scleral icterus.  NECK: Normal range of motion, supple, no masses noted on observation SKIN: No rash noted. Not  diaphoretic. No erythema. No pallor. MUSCULOSKELETAL: Normal range of motion. No edema noted. NEUROLOGIC: Alert and oriented to person, place, and time. Normal muscle tone coordination. No cranial nerve deficit noted. PSYCHIATRIC: Normal mood and affect. Normal behavior. Normal judgment and thought content. CARDIOVASCULAR: Normal heart rate noted RESPIRATORY: Effort and breath sounds normal, no problems with respiration noted ABDOMEN: No masses noted. No other overt distention noted.   PELVIC: Deferred     Assessment and Plan:     1. Breakthrough bleeding with IUD 2. Liletta IUD (intrauterine device) in place since 11/25/2018 Will try NSAIDs, recommeneded Ibuprofen 800 mg po tid x 7 days during her spotting episodes. Will also check IUD position on ultrasound. - US PELVIC COMPLETE WITH TRANSVAGINAL; Future - ibuprofen (ADVIL) 800 MG tablet; Take 1 tablet (800 mg total) by mouth 3 (three) times daily with meals as needed for headache, moderate pain or cramping.  Dispense: 30 tablet; Refill: 3  3. Chronic female pelvic pain 4. Endometriosis determined by laparoscopy 5. Fibroid Ultrasound will also evaluate fibroid and other causes of pain.  Discussed trial of Orilissa for the endometriosis, will monitor effect. R/B/I/A discussed in detail. - US PELVIC COMPLETE WITH TRANSVAGINAL; Future - ibuprofen (ADVIL) 800 MG tablet; Take 1 tablet (800 mg total) by mouth 3 (three) times daily with meals as needed for headache, moderate pain or cramping.  Dispense: 30 tablet; Refill: 3 - Elagolix Sodium (ORILISSA) 150 MG TABS; Take 1 tablet by mouth daily.  Dispense:  30 tablet; Refill: 2  6. Screening mammogram for breast cancer Mammogram scheduled for patient - MM 3D SCREEN BREAST BILATERAL; Future  Routine preventative health maintenance measures emphasized. Please refer to After Visit Summary for other counseling recommendations.   Return in about 6 weeks (around 03/12/2021) for  Endometriosis/Orilissa follow up.    I spent 30 minutes dedicated to the care of this patient including pre-visit review of records, face to face time with the patient discussing her conditions and treatments and post visit orders.    Verita Schneiders, MD, [place] for [employer], [place]

## 2021-02-05 ENCOUNTER — Ambulatory Visit: Payer: PRIVATE HEALTH INSURANCE

## 2021-02-12 ENCOUNTER — Ambulatory Visit
Admission: RE | Admit: 2021-02-12 | Discharge: 2021-02-12 | Disposition: A | Discharge status: Home / Self Care | Payer: PRIVATE HEALTH INSURANCE | Source: Ambulatory Visit | Attending: Obstetrics & Gynecology | Admitting: Obstetrics & Gynecology

## 2021-02-12 DIAGNOSIS — Z1231 Encounter for screening mammogram for malignant neoplasm of breast: Secondary | ICD-10-CM

## 2021-02-19 ENCOUNTER — Other Ambulatory Visit: Payer: Self-pay

## 2021-02-19 ENCOUNTER — Ambulatory Visit
Admission: RE | Admit: 2021-02-19 | Discharge: 2021-02-19 | Disposition: A | Discharge status: Home / Self Care | Payer: 59 | Source: Ambulatory Visit | Attending: Obstetrics & Gynecology | Admitting: Obstetrics & Gynecology

## 2021-02-19 DIAGNOSIS — N809 Endometriosis, unspecified: Secondary | ICD-10-CM | POA: Diagnosis present

## 2021-02-19 DIAGNOSIS — R102 Pelvic and perineal pain: Secondary | ICD-10-CM | POA: Insufficient documentation

## 2021-02-19 DIAGNOSIS — Z975 Presence of (intrauterine) contraceptive device: Secondary | ICD-10-CM | POA: Diagnosis not present

## 2021-02-19 DIAGNOSIS — D219 Benign neoplasm of connective and other soft tissue, unspecified: Secondary | ICD-10-CM | POA: Diagnosis present

## 2021-02-19 DIAGNOSIS — G8929 Other chronic pain: Secondary | ICD-10-CM | POA: Diagnosis present

## 2021-02-19 DIAGNOSIS — N921 Excessive and frequent menstruation with irregular cycle: Secondary | ICD-10-CM | POA: Diagnosis present

## 2021-03-10 ENCOUNTER — Ambulatory Visit (HOSPITAL_COMMUNITY)
Admission: EM | Admit: 2021-03-10 | Discharge: 2021-03-10 | Disposition: A | Discharge status: Home / Self Care | Payer: 59 | Attending: Urgent Care | Admitting: Urgent Care

## 2021-03-10 ENCOUNTER — Encounter (HOSPITAL_COMMUNITY): Payer: Self-pay

## 2021-03-10 DIAGNOSIS — K0889 Other specified disorders of teeth and supporting structures: Secondary | ICD-10-CM | POA: Diagnosis not present

## 2021-03-10 DIAGNOSIS — K047 Periapical abscess without sinus: Secondary | ICD-10-CM

## 2021-03-10 MED ORDER — FLUCONAZOLE 150 MG PO TABS: 150.0000 mg | ORAL_TABLET | ORAL | 0 refills | Status: DC

## 2021-03-10 MED ORDER — LIDOCAINE VISCOUS HCL 2 % MT SOLN: OROMUCOSAL | 0 refills | Status: DC

## 2021-03-10 MED ORDER — NAPROXEN 500 MG PO TABS: 500.0000 mg | ORAL_TABLET | Freq: Two times a day (BID) | ORAL | 0 refills | Status: DC

## 2021-03-10 MED ORDER — AMOXICILLIN-POT CLAVULANATE 875-125 MG PO TABS: 1.0000 | ORAL_TABLET | Freq: Two times a day (BID) | ORAL | 0 refills | Status: DC

## 2021-03-10 NOTE — ED Triage Notes (Signed)
Pt c/o swelling and pain to right lower jaw due to tooth pain. ? ?Started: yesterday ?

## 2021-03-10 NOTE — ED Provider Notes (Signed)
?[facility] ? ? ?MRN: [id number] DOB: Aug 10, 1980 ? ?Subjective:  ? ?Carla Hodges is a 41 y.o. female presenting for 1 day history of acute onset persistent and worsening left oral pain, dental pain, gum pain.  Patient does not know where she can to go for dental care.  Has been using Tylenol and ibuprofen and mouthwash.  No fever, bleeding, drainage of pus. ? ?No current facility-administered medications for this encounter. ? ?Current Outpatient Medications:  ?  albuterol (VENTOLIN HFA) 108 (90 Base) MCG/ACT inhaler, Inhale 2 puffs into the lungs every 4 (four) hours as needed for wheezing or shortness of breath. (Patient not taking: Reported on 01/29/2021), Disp: 8 g, Rfl: 0 ?  CHORIOGONADOTROPIN ALFA [place], Inject 35 Units into the skin every Monday, Wednesday, and Friday., Disp: , Rfl:  ?  Chorionic Gonadotropin (HCG IJ), Inject as directed. Pt reports taking M, W, F; unsure of dosage, Disp: , Rfl:  ?  Elagolix Sodium (ORILISSA) 150 MG TABS, Take 1 tablet by mouth daily., Disp: 30 tablet, Rfl: 2 ?  hydroxychloroquine (PLAQUENIL) 200 MG tablet, Take 200 mg by mouth 2 (two) times daily., Disp: , Rfl:  ?  ibuprofen (ADVIL) 800 MG tablet, Take 1 tablet (800 mg total) by mouth 3 (three) times daily with meals as needed for headache, moderate pain or cramping., Disp: 30 tablet, Rfl: 3 ?  metroNIDAZOLE (METROGEL VAGINAL) 0.75 % vaginal gel, Place 1 Applicatorful vaginally 2 (two) times a week. Place 1 applicator full vaginally twice weekly (Monday and Thursday) (Patient not taking: Reported on 01/29/2021), Disp: 70 g, Rfl: 6  ? ?Allergies  ?Allergen Reactions  ? Latex Other (See Comments)  ?  "break out down there" with latex condom use  ? ? ?Past Medical History:  ?Diagnosis Date  ? Breast nodule 11/06/2015  ? Bronchitis, chronic ([facility])   ? Depression   ? moderate  ? Fibroids   ? 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 Tuberculosis 2010  ? not active  ?  ? ?Past Surgical History:   ?Procedure Laterality Date  ? HYSTEROSCOPY    ? 2000  ? LAPAROSCOPY N/A 06/12/2016  ? Procedure: LAPAROSCOPY DIAGNOSTIC WITH PERITONEAL BIOSPSY;  Surgeon: Emily Filbert, MD;  Location: [facility];  Service: Gynecology;  Laterality: N/A;  ? LAPAROSCOPY N/A 06/12/2016  ? Procedure: LAPAROSCOPY DIAGNOSTIC, EVACUATION OF HEMATOMA;  Surgeon: Emily Filbert, MD;  Location: [facility];  Service: Gynecology;  Laterality: N/A;  ? ? ?Family History  ?Problem Relation Age of Onset  ? Diabetes Maternal Uncle   ? Diabetes Maternal Uncle   ? Sarcoidosis Cousin   ? Alcohol abuse Neg Hx   ? Arthritis Neg Hx   ? Asthma Neg Hx   ? Birth defects Neg Hx   ? Cancer Neg Hx   ? COPD Neg Hx   ? Depression Neg Hx   ? Drug abuse Neg Hx   ? Early death Neg Hx   ? Hearing loss Neg Hx   ? Heart disease Neg Hx   ? Hyperlipidemia Neg Hx   ? Hypertension Neg Hx   ? Kidney disease Neg Hx   ? Learning disabilities Neg Hx   ? Mental illness Neg Hx   ? Mental retardation Neg Hx   ? Miscarriages / Stillbirths Neg Hx   ? Stroke Neg Hx   ? Vision loss Neg Hx   ? ? ?Social History  ? ?Tobacco Use  ? Smoking status:  Never  ? Smokeless tobacco: Never  ?Substance Use Topics  ? Alcohol use: No  ? Drug use: Yes  ?  Types: Marijuana  ? ? ?ROS ? ? ?Objective:  ? ?Vitals: ?BP (!) 142/92 (BP Location: Left Arm)   Pulse 67   Temp 98.5 ?F (36.9 ?C) (Oral)   Resp 20   SpO2 97%  ? ?Physical Exam ?Constitutional:   ?   General: She is not in acute distress. ?   Appearance: Normal appearance. She is well-developed. She is not ill-appearing, toxic-appearing or diaphoretic.  ?HENT:  ?   Head: Normocephalic and atraumatic.  ?   Nose: Nose normal.  ?   Mouth/Throat:  ?   Mouth: Mucous membranes are moist.  ? ?Eyes:  ?   General: No scleral icterus.    ?   Right eye: No discharge.     ?   Left eye: No discharge.  ?   Extraocular Movements: Extraocular movements intact.  ?Cardiovascular:  ?   Rate and Rhythm: Normal rate.  ?Pulmonary:  ?   Effort: Pulmonary effort is normal.  ?Skin: ?    General: Skin is warm and dry.  ?Neurological:  ?   General: No focal deficit present.  ?   Mental Status: She is alert and oriented to person, place, and time.  ?Psychiatric:     ?   Mood and Affect: Mood normal.     ?   Behavior: Behavior normal.  ? ? ?Assessment and Plan :  ? ?PDMP not reviewed this encounter. ? ?1. Dental infection   ?2. Pain, dental   ? ?Start Augmentin for dental infection/abscess, use naproxen for pain and inflammation. Emphasized need for dental surgeon consult. Counseled patient on potential for adverse effects with medications prescribed/recommended today, strict ER and return-to-clinic precautions discussed, patient verbalized understanding. ? ?  ?Jaynee Eagles, PA-C ?03/10/21 1715 ? ?

## 2021-03-10 NOTE — Discharge Instructions (Signed)
Make sure you schedule an appointment with a dentist/dental surgeon as soon as possible.  You may try some of the resources below.    [facility] in [place], [place] Address: [address], [place], [place] [address] Phone: [phone]  [facility] [phone] extension [phone]  Dr. Civils [phone] [address]  [facility] [phone] [address]  [facility] [phone] extension [phone] [place], [place], [address] First come first serve for the first 10 clients.  May do simple extractions only, no wisdom teeth or surgery.  You may try the second for Thursday of the month starting at 6:30 AM.  [employer] You may call the school to see if they are still helping to provide dental care for emergent cases.  

## 2021-03-22 ENCOUNTER — Ambulatory Visit: Payer: PRIVATE HEALTH INSURANCE | Admitting: Obstetrics & Gynecology

## 2021-04-05 ENCOUNTER — Ambulatory Visit (INDEPENDENT_AMBULATORY_CARE_PROVIDER_SITE_OTHER): Payer: PRIVATE HEALTH INSURANCE | Admitting: Family Medicine

## 2021-04-05 ENCOUNTER — Other Ambulatory Visit: Payer: Self-pay | Admitting: Family Medicine

## 2021-04-05 VITALS — BP 116/78 | HR 71 | Wt 300.0 lb

## 2021-04-05 DIAGNOSIS — N898 Other specified noninflammatory disorders of vagina: Secondary | ICD-10-CM | POA: Insufficient documentation

## 2021-04-05 DIAGNOSIS — B9689 Other specified bacterial agents as the cause of diseases classified elsewhere: Secondary | ICD-10-CM

## 2021-04-05 LAB — POCT WET PREP (WET MOUNT)
Clue Cells Wet Prep Whiff POC: POSITIVE
Trichomonas Wet Prep HPF POC: ABSENT

## 2021-04-05 MED ORDER — METRONIDAZOLE 500 MG PO TABS: 500.0000 mg | ORAL_TABLET | Freq: Two times a day (BID) | ORAL | 0 refills | Status: DC

## 2021-04-05 NOTE — Progress Notes (Signed)
? ? ?  SUBJECTIVE:  ? ?CHIEF COMPLAINT / HPI:  ? ?Patient presents with vaginal discharge for 2-3 days. Reports that she feels that her period is starting soon and when this occurs she gets a brownish discharge. Today she notes a clear, light brown discharge as well. Denies any new abdominal or pelvic pain. Uses IUD for contraception. Denies any fever, chills, dysuria, associated odor or other symptoms. Sexually active with 1 female partner uses protection with each encounter. LMP 02/27/2021 but has irregular periods with occasional spotting.  ? ?Patient experienced prior abdominal pain and was referred to gynecology. She currently follows up with them and was given a medication [name]) but helps with her endometriosis. She is still experiencing the pain but has another upcoming follow up with gynecology.  ? ?OBJECTIVE:  ? ?BP 116/78   Pulse 71   Wt 300 lb (136.1 kg)   SpO2 98%   BMI 49.16 kg/m?   ?General: Patient well-appearing, in no acute distress. ?Resp: normal work of breathing  ?GU: normal vulva without gross deformity noted, no vaginal discharge noted, minimal bleeding noted, no rashes or external lesions noted  ?Psych: mood appropriate   ? ?GU exam performed in the presence of Hosie Poisson, CMA.  ? ?ASSESSMENT/PLAN:  ? ?Vaginal discharge ?-discharge likely physiologic and secondary to period but obtained wet prep, awaiting results ?-patient politely declines STD testing ?-up to date on PAP, next due Sept 2023 ?-encouraged safe sex practices ?-instructed to maintain routine gynecology follow up  ?-follow up with PCP as appropriate ? ? ?Donney Dice, DO ?[place]  ?

## 2021-04-05 NOTE — Patient Instructions (Addendum)
It was great seeing you today! ? ?Today we discussed your vaginal discharge, this may be due to your upcoming period but we have completed testing to ensure it is not due to another cause. I will let you know of any abnormal results. Please continue to use protection each time you have intercourse. Also please make sure to maintain regular follow up with your gynecologist.  ? ?Please follow up at your next scheduled appointment, if anything arises between now and then, please don't hesitate to contact our office. ? ? ?Thank you for allowing [place] to be a part of your medical care! ? ?Thank you, ?Dr. Larae Grooms  ?

## 2021-04-05 NOTE — Assessment & Plan Note (Signed)
-  discharge likely physiologic and secondary to period but obtained wet prep, awaiting results ?-patient politely declines STD testing ?-up to date on PAP, next due Sept 2023 ?-encouraged safe sex practices ?-instructed to maintain routine gynecology follow up  ?-follow up with PCP as appropriate ?

## 2021-05-02 ENCOUNTER — Other Ambulatory Visit: Payer: Self-pay | Admitting: Family Medicine

## 2021-05-02 DIAGNOSIS — N760 Acute vaginitis: Secondary | ICD-10-CM

## 2021-05-13 ENCOUNTER — Other Ambulatory Visit: Payer: Self-pay | Admitting: Obstetrics & Gynecology

## 2021-05-13 DIAGNOSIS — G8929 Other chronic pain: Secondary | ICD-10-CM

## 2021-05-13 DIAGNOSIS — N809 Endometriosis, unspecified: Secondary | ICD-10-CM

## 2021-05-17 ENCOUNTER — Encounter: Payer: Self-pay | Admitting: Student

## 2021-05-17 ENCOUNTER — Ambulatory Visit (INDEPENDENT_AMBULATORY_CARE_PROVIDER_SITE_OTHER): Payer: 59 | Admitting: Student

## 2021-05-17 ENCOUNTER — Telehealth: Payer: Self-pay | Admitting: Student

## 2021-05-17 VITALS — BP 157/97 | HR 65 | Ht 65.5 in | Wt 299.2 lb

## 2021-05-17 DIAGNOSIS — Z79899 Other long term (current) drug therapy: Secondary | ICD-10-CM | POA: Diagnosis not present

## 2021-05-17 DIAGNOSIS — K047 Periapical abscess without sinus: Secondary | ICD-10-CM

## 2021-05-17 DIAGNOSIS — B9689 Other specified bacterial agents as the cause of diseases classified elsewhere: Secondary | ICD-10-CM

## 2021-05-17 DIAGNOSIS — N760 Acute vaginitis: Secondary | ICD-10-CM | POA: Diagnosis not present

## 2021-05-17 NOTE — Telephone Encounter (Signed)
Patient dropped off form at front desk for [facility]  Verified that patient section of form has been completed.  Last DOS/WCC with PCP was 05/17/21.  Placed form in red team folder to be completed by clinical staff.  Creig Hines  ?

## 2021-05-17 NOTE — Patient Instructions (Signed)
Ms. Carla Hodges, ? ?It is so nice to finally meet you.  I am glad that you are doing well.  And I am glad that the Carla Hodges seems to be you.  I will be eager to see what Dr. Damita Dunnings has to say when you see her next week.  Today, I would like to check your blood counts and your liver and kidney function just to make sure that you are tolerating the long-term hydroxychloroquine okay.  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  I am happy to start this anytime for you.  I am always happy to see you, let me know if there is anything that you need. ? ?Carla Dubonnet, MD ? ?

## 2021-05-18 DIAGNOSIS — Z79899 Other long term (current) drug therapy: Secondary | ICD-10-CM | POA: Insufficient documentation

## 2021-05-18 LAB — COMPREHENSIVE METABOLIC PANEL
ALT: 10 IU/L (ref 0–32)
AST: 15 IU/L (ref 0–40)
Albumin/Globulin Ratio: 1.5 (ref 1.2–2.2)
Albumin: 4 g/dL (ref 3.8–4.8)
Alkaline Phosphatase: 75 IU/L (ref 44–121)
BUN/Creatinine Ratio: 6 — ABNORMAL LOW (ref 9–23)
BUN: 4 mg/dL — ABNORMAL LOW (ref 6–24)
Bilirubin Total: 0.4 mg/dL (ref 0.0–1.2)
CO2: 26 mmol/L (ref 20–29)
Calcium: 9 mg/dL (ref 8.7–10.2)
Chloride: 106 mmol/L (ref 96–106)
Creatinine, Ser: 0.67 mg/dL (ref 0.57–1.00)
Globulin, Total: 2.7 g/dL (ref 1.5–4.5)
Glucose: 82 mg/dL (ref 70–99)
Potassium: 3.7 mmol/L (ref 3.5–5.2)
Sodium: 146 mmol/L — ABNORMAL HIGH (ref 134–144)
Total Protein: 6.7 g/dL (ref 6.0–8.5)
eGFR: 113 mL/min/{1.73_m2} (ref >59)

## 2021-05-18 LAB — CBC
Hematocrit: 33 % — ABNORMAL LOW (ref 34.0–46.6)
Hemoglobin: 11.3 g/dL (ref 11.1–15.9)
MCH: 29.8 pg (ref 26.6–33.0)
MCHC: 34.2 g/dL (ref 31.5–35.7)
MCV: 87 fL (ref 79–97)
Platelets: 265 10*3/uL (ref 150–450)
RBC: 3.79 x10E6/uL (ref 3.77–5.28)
RDW: 12.5 % (ref 11.7–15.4)
WBC: 5 10*3/uL (ref 3.4–10.8)

## 2021-05-18 NOTE — Assessment & Plan Note (Addendum)
Has been on therapy for nearly a year at this point. Will obtain CBC, CMP today to monitor liver/kidney function ?

## 2021-05-18 NOTE — Assessment & Plan Note (Signed)
Has a history of recurrent BV.  No active symptoms at this time.  Advised that in the future, may consider MetroGel twice weekly for 4 months to try and treat for recurrence.  Patient to reach out if and when symptoms recur. ?

## 2021-05-18 NOTE — Progress Notes (Signed)
? ? ?  SUBJECTIVE:  ? ?CHIEF COMPLAINT / HPI:  ? ?Meet new PCP ?Carla Hodges is a 41yo female who presents today to meet me as her new PCP. She tells me she is from [place] originally, but has been in [place] for a while. Works at [employer]. Works "all the time." Enjoys going to comedy shows with her boyfriend. Is generally healthy, aside from dysmenorrhea secondary to endometriosis and uterine fibroids for which she is followed by gyn and recently started Orlissa tabs. 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  ?She also reports a history of recurrent BV. We have discussed this over the phone previously. Does well with Flagyl PO but has had poor response with vaginal applications.  ? ?Long-term use of hydroxychloroquine ?Patient is on hydroxychloroquine for sarcoidosis of the skin. Has been seen by dermatology and has also had pulmonology assessment for pulmonary manifestations of disease.  ? ?OBJECTIVE:  ? ?BP (!) 157/97   Pulse 65   Ht 5' 5.5" (1.664 m)   Wt 299 lb 3.2 oz (135.7 kg)   SpO2 100%   BMI 49.03 kg/m?   ?Physical Exam ?Vitals reviewed.  ?Constitutional:   ?   General: She is not in acute distress. ?Cardiovascular:  ?   Rate and Rhythm: Normal rate and regular rhythm.  ?Pulmonary:  ?   Effort: Pulmonary effort is normal.  ?Skin: ?   General: Skin is warm and dry.  ?Neurological:  ?   General: No focal deficit present.  ? ? ? ?ASSESSMENT/PLAN:  ? ?Long-term use of hydroxychloroquine ?Has been on therapy for nearly a year at this point. Will obtain CBC, CMP today to monitor liver/kidney function ? ?BV (bacterial vaginosis) ?Has a history of recurrent BV.  No active symptoms at this time.  Advised that in the future, may consider MetroGel twice weekly for 4 months to try and treat for recurrence.  Patient to reach out if and when symptoms recur. ?  ? ? ?Pearla Dubonnet, MD ?[place]  ?

## 2021-05-18 NOTE — Telephone Encounter (Signed)
Reviewed form and placed in PCP's box for completion.  .Dai Mcadams R Eh Sauseda, CMA  

## 2021-05-21 NOTE — Telephone Encounter (Signed)
Form picked up by patient.  ? ?Copy made for batch scanning.  ?

## 2021-05-28 ENCOUNTER — Encounter: Payer: Self-pay | Admitting: Obstetrics and Gynecology

## 2021-05-28 ENCOUNTER — Ambulatory Visit (INDEPENDENT_AMBULATORY_CARE_PROVIDER_SITE_OTHER): Payer: 59 | Admitting: Obstetrics and Gynecology

## 2021-05-28 VITALS — BP 132/85 | HR 80 | Wt 290.0 lb

## 2021-05-28 DIAGNOSIS — N809 Endometriosis, unspecified: Secondary | ICD-10-CM | POA: Diagnosis not present

## 2021-05-28 DIAGNOSIS — Z975 Presence of (intrauterine) contraceptive device: Secondary | ICD-10-CM | POA: Diagnosis not present

## 2021-05-28 NOTE — Progress Notes (Signed)
  CC: follow up of orlissa Subjective:    Patient ID: Carla Hodges, female    DOB: 1980/01/12, 41 y.o.   MRN: [id number]  HPI Pt seen for follow up of Orlissa.  She states the spotting has resolved while on the medication, and she notes some slight improvement of endometriosis pain as well.  No other side effects at this time.  Discussed continued use of Orlissa for 1-2 years before needing at least a bone scan to eval for osteoporosis.   Review of Systems     Objective:   Physical Exam Vitals:   05/28/21 1605  BP: 132/85  Pulse: 80  CLINICAL DATA:  Chronic pelvic pain, has IUD, LMP 12/30/2020, known endometriosis and uterine fibroids, breakthrough bleeding   EXAM: TRANSABDOMINAL AND TRANSVAGINAL ULTRASOUND OF PELVIS   TECHNIQUE: Both transabdominal and transvaginal ultrasound examinations of the pelvis were performed. Transabdominal technique was performed for global imaging of the pelvis including uterus, ovaries, adnexal regions, and pelvic cul-de-sac. It was necessary to proceed with endovaginal exam following the transabdominal exam to visualize the uterus, endometrium, and ovaries.   COMPARISON:  11/02/2015   FINDINGS: Uterus   Measurements: 5.8 x 4.0 x 5.9 cm = volume: 72 mL. Retroverted. Heterogeneous echogenicity. Otherwise normal morphology without mass   Endometrium   Thickness: 4 mm. IUD at mid to lower uterine segments, question limb extending to uterine fundus. No endometrial fluid   Right ovary   Measurements: 2.5 x 1.8 x 2.5 cm = volume: 5.8 mL. Normal morphology without mass   Left ovary   Measurements: 4.6 x 2.0 x 3.7 cm = volume: 18.1 mL. Normal morphology without mass   Other findings   Trace free pelvic fluid.  No adnexal masses.   IMPRESSION: IUD at mid to lower uterine segments with question 1 limb extending to uterine fundus.   Otherwise negative exam.         Assessment & Plan:   1. Endometriosis determined by  laparoscopy Continue Orlissa and progesterone IUD for treatment, reassess in 1-2 years for possible bone scan  2. Liletta IUD (intrauterine device) in place since 11/25/2018 Spotting resolved on medication, no intervention with device.  F/u 09/2021 for AE/pap  I spent 20 minutes dedicated to the care of this patient including previsit review of records, face to face time with the patient discussing ultrasound reports, current treatment regimen, treatment duration and post visit testing.     Griffin Basil, MD Faculty Attending, Center for [facility]

## 2021-06-12 ENCOUNTER — Encounter: Payer: Self-pay | Admitting: *Deleted

## 2021-06-15 MED ORDER — AMOXICILLIN-POT CLAVULANATE 500-125 MG PO TABS
1.0000 | ORAL_TABLET | Freq: Three times a day (TID) | ORAL | 0 refills | Status: AC
Start: 2021-06-15 — End: 2021-06-25

## 2021-06-15 NOTE — Addendum Note (Signed)
Addended by: Jim Like B on: 06/15/2021 02:35 PM   Modules accepted: Orders

## 2021-06-15 NOTE — Telephone Encounter (Signed)
Phoned patient to discuss dental complaint. Patient with known dental disease now with increased pain in gumline and jaw and formation of a "bubble" at the base of the tooth concerning for abscess formation. Does have a history of dental infections as recently as March.  Patient will not be able to get to a dentist today, but will seek dental care ASAP at the beginning of next week. Will initiate antibiotics with patient's plan to pursue dental evaluation. Strict ED precautions discussed for fevers or progression of dental disease limiting her ability to open her mouth completely.  - Augmentin 500-'125mg'$  TID x10 days  Pearla Dubonnet, MD

## 2021-07-11 ENCOUNTER — Telehealth: Payer: Self-pay | Admitting: Pharmacist

## 2021-07-11 NOTE — Telephone Encounter (Signed)
Patient called inquiring about cost of medication Carla Hodges and if it is being covered through insurance. Contacted pharmacy to ensure is being run through correct insurance and cost of medication. PA originally submitted to [employer] which is not patient's current insurance. Resent PA to [facility] as that is patient's current insurance.

## 2021-07-12 ENCOUNTER — Telehealth: Payer: Self-pay

## 2021-07-12 NOTE — Telephone Encounter (Signed)
Pt call from transfer from the front office.  Pt called concerned about the cost of the [name] that will cost her $300.  I explained to the patient the person she spoke with yesterday resubmitted another PA to her correct insurance.  I advised pt in the meantime to please utilize [facility], call [name] to see if she can get a voucher while we are waiting for denial or approval from her insurance.  If neither are sufficient we will reach out to the provider for another option.  I informed pt to use Naproxen or ibuprofen for pain in the meantime as it may take up to week to finalize her management of care.  Pt verbalized understanding.    Carla Hodges  07/12/21

## 2021-07-26 ENCOUNTER — Encounter: Payer: Self-pay | Admitting: Family Medicine

## 2021-07-26 ENCOUNTER — Other Ambulatory Visit: Payer: Self-pay

## 2021-07-26 ENCOUNTER — Ambulatory Visit (INDEPENDENT_AMBULATORY_CARE_PROVIDER_SITE_OTHER): Payer: 59 | Admitting: Family Medicine

## 2021-07-26 VITALS — BP 132/84 | HR 100 | Wt 288.6 lb

## 2021-07-26 DIAGNOSIS — K029 Dental caries, unspecified: Secondary | ICD-10-CM

## 2021-07-26 DIAGNOSIS — R253 Fasciculation: Secondary | ICD-10-CM | POA: Diagnosis not present

## 2021-07-26 MED ORDER — AMOXICILLIN 500 MG PO TABS: 500.0000 mg | ORAL_TABLET | Freq: Two times a day (BID) | ORAL | 0 refills | Status: DC

## 2021-07-26 NOTE — Patient Instructions (Addendum)
It was great seeing you today!  I prescribed an antibiotic for your tooth infection, which you will take twice a day for 7 days.  Please call the dentist as soon as possible so they can remove the tooth or perform root canal.  This may also help with the irritation and twitching of your eye.  The meantime I do recommend taking a multivitamin with magnesium which can get over-the-counter, and drinking plenty water which can potentially help with the twitching.  Keep your eyes lubricated, and wear glasses much as possible.  Return in 2 weeks if the twitching has not improved, but it should resolve on its own.  Feel free to call with any questions or concerns at any time, at [phone].   Take care,  Dr. Shary Key [facility]

## 2021-07-26 NOTE — Progress Notes (Unsigned)
    SUBJECTIVE:   CHIEF COMPLAINT / HPI:   Patient presents for L eye twitching for the past 2 weeks.  Occurs intermittently on a daily basis.  Feels like twitching is worse without glasses but when she wears her glasses for a long time worsens. Has appointment with eye doctor next week.   Additionally she states she is having dental pain, has chipped her tooth and has not been able to get an appointment with the dentist wants abx for tooth. States Dr. Joelyn Oms prescribed Augmentin in May for 10 days and she would like another antibiotic prescription.  Wants colonscopy- states she was eating cashews and when she eats them they are whole in her Bms. Endorses chronic abdominal pain. Denies blood in stool   Has skin sarcoidosis, stopped taking hydroxychloroquine   Does state she has endometriosis    PERTINENT  PMH / PSH: reviewed   OBJECTIVE:   BP 132/84   Pulse 100   Wt 288 lb 9.6 oz (130.9 kg)   SpO2 100%   BMI 47.30 kg/m   ***  ASSESSMENT/PLAN:   No problem-specific Assessment & Plan notes found for this encounter.   Eye twitching Fatigue and stress, eye strain, caffeine or alcohol, dry eyes, nutritional imbalances, allergies   Dental carry Concern for infection as gum is erythematous and tooth is cracked.  Prescribed amoxicillin for 7 days, but did strongly suggest that she call to get in with her dentist as soon as possible.  [place]

## 2021-07-29 DIAGNOSIS — K029 Dental caries, unspecified: Secondary | ICD-10-CM | POA: Insufficient documentation

## 2021-07-29 DIAGNOSIS — R253 Fasciculation: Secondary | ICD-10-CM | POA: Insufficient documentation

## 2021-07-29 NOTE — Assessment & Plan Note (Signed)
Likely in the setting of fatigue but potentially also eye strain given improvement when she puts on her glasses. Could also be related to nutritional imbalances so recommended daly MVI. Reassuringly no pain, change in vision or other red flag symptoms associated. She will see her ophthalmologist next week. Return precautions discussed.

## 2021-07-29 NOTE — Assessment & Plan Note (Signed)
Concern for infection as gum is erythematous and bottom left molar cracked.  Prescribed Amoxicillin BID for 7 days, but did strongly suggest that she call to get in with her dentist as soon as possible.

## 2021-08-15 ENCOUNTER — Encounter (INDEPENDENT_AMBULATORY_CARE_PROVIDER_SITE_OTHER): Payer: Self-pay

## 2021-08-23 ENCOUNTER — Encounter: Payer: Self-pay | Admitting: Student

## 2021-08-23 DIAGNOSIS — K029 Dental caries, unspecified: Secondary | ICD-10-CM

## 2021-08-29 MED ORDER — AMOXICILLIN 500 MG PO TABS: 500.0000 mg | ORAL_TABLET | Freq: Two times a day (BID) | ORAL | 0 refills | Status: DC

## 2021-09-03 ENCOUNTER — Encounter: Payer: Self-pay | Admitting: Student

## 2021-09-07 ENCOUNTER — Ambulatory Visit (INDEPENDENT_AMBULATORY_CARE_PROVIDER_SITE_OTHER): Payer: No Typology Code available for payment source | Admitting: Student

## 2021-09-07 ENCOUNTER — Other Ambulatory Visit (HOSPITAL_COMMUNITY)
Admission: RE | Admit: 2021-09-07 | Discharge: 2021-09-07 | Disposition: A | Discharge status: Home / Self Care | Payer: No Typology Code available for payment source | Source: Ambulatory Visit | Attending: Family Medicine | Admitting: Family Medicine

## 2021-09-07 ENCOUNTER — Encounter: Payer: Self-pay | Admitting: Student

## 2021-09-07 VITALS — BP 127/84 | HR 68 | Wt 290.4 lb

## 2021-09-07 DIAGNOSIS — N898 Other specified noninflammatory disorders of vagina: Secondary | ICD-10-CM

## 2021-09-07 DIAGNOSIS — Z124 Encounter for screening for malignant neoplasm of cervix: Secondary | ICD-10-CM | POA: Insufficient documentation

## 2021-09-07 LAB — POCT WET PREP (WET MOUNT)
Clue Cells Wet Prep Whiff POC: NEGATIVE
Trichomonas Wet Prep HPF POC: ABSENT
WBC, Wet Prep HPF POC: NONE SEEN

## 2021-09-07 MED ORDER — FLUCONAZOLE 150 MG PO TABS
150.0000 mg | ORAL_TABLET | Freq: Once | ORAL | 0 refills | Status: AC
Start: 2021-09-07 — End: 2021-09-07

## 2021-09-07 NOTE — Assessment & Plan Note (Addendum)
Wet prep just with moderate bacteria but no clue cells or yeast. Visual appearance of discharge more consistent with yeast than with BV. Given appearance of discharge, indeterminate wet prep, and patient description of symptoms, will treat empirically for yeast. If symptoms persist, could consider BV treatment, though would advise that after acute treatment, she receive a maintenance dose period for recurrent BV. Given non-response to boric acid and metrogel in the past, perhaps could consider a modified extended Flagyl course with twice weekly oral Flagyl instead of vaginal gel?  - Diflucan ordered to patient pharmacy - As patient will be due for Pap in 1 week, went ahead and obtained Pap smear as part of speculum exam

## 2021-09-07 NOTE — Patient Instructions (Addendum)
Carla Hodges,  It is great to see you! I'm sorry that these symptoms have returned. I am glad, though, that we were able to get your Pap Smear out of the way as well. I actually did not see any evidence of BV or yeast on your microscopy slide, but given your symptoms, we will treat with Diflucan.  As you know, you can take 1 pill and if you are still having symptoms in 2 to 3 days, take the second pill. I am also giving you a list of dentists in the area who take your insurance, please call them ASAP and try to get an appointment.  The ultimate fix for your dental issues will be dental intervention and not just antibiotics.  See you soon, Carla Dubonnet, MD

## 2021-09-07 NOTE — Progress Notes (Signed)
    SUBJECTIVE:   CHIEF COMPLAINT / HPI:   Vaginal Discharge: Patient is a 41 y.o. female presenting with vaginal discharge for several days. No new pain or other symptoms. Sexually active with one female partner with no concern for STIs. She has a history of recurrent BV but describes this present episode as more consistent with yeast vaginitis, which she has also had in the past. She has tried boric acid in the past. Has not been successful in treating her BV episodes with metrogel but oral metronidazole does seem to work for her.   Dental Pain Patient with severe dental pain and concern for dental abscess in Left Lower molar region. Previously discussed this with me over the phone and I prescribed her a course of a amoxicillin and advised STAT follow-up with a dentist. She has had difficulty in identifying a provider in town who accepts her dental insurance ([place]).   OBJECTIVE:   BP 127/84   Pulse 68   Wt 290 lb 6.4 oz (131.7 kg)   SpO2 100%   BMI 47.59 kg/m    General: NAD, pleasant, able to participate in exam Mouth:  Poor dentition with fractured molar in Left lower mandible, multiple caries but no obvious evidence of abscess formation Respiratory: Normal effort, no obvious respiratory distress Pelvic: VULVA: normal appearing vulva with no masses, tenderness or lesions, VAGINA: Normal appearing vagina with normal color, no lesions, with scant, white, and thick discharge present CERVIX: No lesions, scant, white, and thick discharge present, IUD strings in place  Chaperone [place] present for pelvic exam  ASSESSMENT/PLAN:   Vaginal discharge Wet prep just with moderate bacteria but no clue cells or yeast. Visual appearance of discharge more consistent with yeast than with BV. Given appearance of discharge, indeterminate wet prep, and patient description of symptoms, will treat empirically for yeast. If symptoms persist, could consider BV treatment, though would advise that  after acute treatment, she receive a maintenance dose period for recurrent BV. Given non-response to boric acid and metrogel in the past, perhaps could consider a modified extended Flagyl course with twice weekly oral Flagyl instead of vaginal gel?  - Diflucan ordered to patient pharmacy - As patient will be due for Pap in 1 week, went ahead and obtained Pap smear as part of speculum exam     Pearla Dubonnet, MD [place]

## 2021-09-12 LAB — CYTOLOGY - PAP
Comment: NEGATIVE
Diagnosis: NEGATIVE
High risk HPV: NEGATIVE

## 2021-10-05 ENCOUNTER — Encounter: Payer: Self-pay | Admitting: Student

## 2021-10-05 DIAGNOSIS — K029 Dental caries, unspecified: Secondary | ICD-10-CM

## 2021-10-05 MED ORDER — AMOXICILLIN 500 MG PO TABS: 500.0000 mg | ORAL_TABLET | Freq: Two times a day (BID) | ORAL | 0 refills | Status: DC

## 2021-11-07 ENCOUNTER — Other Ambulatory Visit: Payer: Self-pay | Admitting: Student

## 2021-11-07 DIAGNOSIS — K029 Dental caries, unspecified: Secondary | ICD-10-CM

## 2021-11-07 MED ORDER — AMOXICILLIN 500 MG PO TABS: 500.0000 mg | ORAL_TABLET | Freq: Two times a day (BID) | ORAL | 0 refills | Status: DC

## 2021-11-16 ENCOUNTER — Encounter: Payer: Self-pay | Admitting: Family Medicine

## 2021-11-16 ENCOUNTER — Ambulatory Visit (INDEPENDENT_AMBULATORY_CARE_PROVIDER_SITE_OTHER): Payer: 59 | Admitting: Family Medicine

## 2021-11-16 ENCOUNTER — Other Ambulatory Visit: Payer: Self-pay

## 2021-11-16 VITALS — BP 112/85 | HR 63 | Wt 276.6 lb

## 2021-11-16 DIAGNOSIS — N898 Other specified noninflammatory disorders of vagina: Secondary | ICD-10-CM

## 2021-11-16 LAB — POCT WET PREP (WET MOUNT)
Clue Cells Wet Prep Whiff POC: NEGATIVE
Trichomonas Wet Prep HPF POC: ABSENT

## 2021-11-16 NOTE — Patient Instructions (Signed)
It was great seeing you today!  We checked for BV and yeast and I will reach out to you if anything is abnormal and send treatment if needed.   Feel free to call with any questions or concerns at any time, at [phone].   Take care,  Dr. Shary Key [facility]

## 2021-11-16 NOTE — Progress Notes (Unsigned)
    SUBJECTIVE:   CHIEF COMPLAINT / HPI:   Vaginal Discharge: Patient is a 41 y.o. female presenting with vaginal discharge for *** days.  She states the discharge is of *** consistency.  She endorses *** vaginal odor.  She is interested in screening for sexually transmitted infections today.  PERTINENT  PMH / PSH: ***None relevant  OBJECTIVE:   BP 112/85   Pulse 63   Wt 276 lb 9.6 oz (125.5 kg)   SpO2 100%   BMI 45.33 kg/m    General: NAD, pleasant, able to participate in exam Respiratory: Normal effort, no obvious respiratory distress Pelvic: VULVA: normal appearing vulva with no masses, tenderness or lesions, VAGINA: Normal appearing vagina with normal color, no lesions, with {GYN VAGINAL DISCHARGE:21986} discharge present, ***CERVIX: No lesions, {GYN VAGINAL DISCHARGE:21986} discharge present,  Chaperone *** present for pelvic exam  ASSESSMENT/PLAN:   No problem-specific Assessment & Plan notes found for this encounter.    Assessment:  41 y.o. female with vaginal discharge for***days, as well as***.  Physical exam significant for*** discharge.  Wet prep performed today shows *** consistent with ***.  Patient is interested in STI screening.   Plan: -Wet prep as above.  Will treat with***. -GC/chlamydia pending -Will check HIV and [place]

## 2021-12-03 ENCOUNTER — Encounter: Payer: Self-pay | Admitting: Family Medicine

## 2021-12-03 ENCOUNTER — Ambulatory Visit (INDEPENDENT_AMBULATORY_CARE_PROVIDER_SITE_OTHER): Payer: 59 | Admitting: Family Medicine

## 2021-12-03 VITALS — BP 138/88 | Wt 286.1 lb

## 2021-12-03 DIAGNOSIS — R350 Frequency of micturition: Secondary | ICD-10-CM | POA: Diagnosis not present

## 2021-12-03 LAB — POCT URINALYSIS DIP (MANUAL ENTRY)
Bilirubin, UA: NEGATIVE
Blood, UA: NEGATIVE
Glucose, UA: NEGATIVE mg/dL
Ketones, POC UA: NEGATIVE mg/dL
Leukocytes, UA: NEGATIVE
Nitrite, UA: NEGATIVE
Protein Ur, POC: NEGATIVE mg/dL
Spec Grav, UA: 1.02 (ref 1.010–1.025)
Urobilinogen, UA: 0.2 E.U./dL
pH, UA: 7 (ref 5.0–8.0)

## 2021-12-03 NOTE — Patient Instructions (Signed)
It was wonderful to see you today.  Please bring ALL of your medications with you to every visit.   Updates from today's visit:  The cause of your urinary frequency is unclear.  Your urine test did not show any sign of infection.  You may consider trying to drink only water (a normal amount) for a few days and avoid other drinks to see if that helps.  Please follow up as needed  Thank you for choosing [facility].   Please call [phone] with any questions about today's appointment.  Please be sure to schedule follow up at the front  desk before you leave today.   August Albino, MD  Family Medicine

## 2021-12-03 NOTE — Progress Notes (Signed)
  SUBJECTIVE:   CHIEF COMPLAINT / HPI:   Urinary frequency -Ongoing x2 days, getting worse -No back pain, dysuria, hematuria, foul odor -No fevers -Chronic abd pain due to endometriosis -No vaginal discharge -Has been taking amoxicillin x1 week due to upcoming dental procedure  -Has not been sexually active x77mo-Has been drinking Olipop sodas (around 2 per day) instead of soda, started drinking these about 132mogo   OBJECTIVE:  BP 138/88   Wt 286 lb 2 oz (129.8 kg)   BMI 46.89 kg/m   General: NAD, pleasant, able to participate in exam Cardiac: RRR, no murmurs auscultated Respiratory: CTAB, normal WOB Abdomen: soft, non-tender, non-distended, normoactive bowel sounds Extremities: warm and well perfused, no edema or cyanosis Skin: warm and dry, no rashes noted Neuro: alert, no obvious focal deficits, speech normal Psych: Normal affect and mood  ASSESSMENT/PLAN:   Urinary frequency Assessment & Plan: Concern for UTI given acute onset of urinary frequency.  Reassuringly, no fevers, dysuria, hematuria.  No history of vaginal discharge to suggest underlying STD.  Will collect a urine sample and treat if positive, if negative may continue to follow as needed.  Other potential causes may include underlying diabetes (consider checking A1c if no improvement), excessive fluid intake.  UA negative for UTI, f/u as needed  Orders: -     POCT urinalysis dipstick   No orders of the defined types were placed in this encounter.  Return if symptoms worsen or fail to improve.  AtAugust AlbinoMD [facility]

## 2021-12-03 NOTE — Assessment & Plan Note (Addendum)
Concern for UTI given acute onset of urinary frequency.  Reassuringly, no fevers, dysuria, hematuria.  No history of vaginal discharge to suggest underlying STD.  Will collect a urine sample and treat if positive, if negative may continue to follow as needed.  Other potential causes may include underlying diabetes (consider checking A1c if no improvement), excessive fluid intake.  UA negative for UTI, f/u as needed

## 2021-12-16 ENCOUNTER — Other Ambulatory Visit: Payer: Self-pay | Admitting: Obstetrics & Gynecology

## 2021-12-16 DIAGNOSIS — N809 Endometriosis, unspecified: Secondary | ICD-10-CM

## 2021-12-16 DIAGNOSIS — G8929 Other chronic pain: Secondary | ICD-10-CM

## 2022-01-11 ENCOUNTER — Ambulatory Visit (INDEPENDENT_AMBULATORY_CARE_PROVIDER_SITE_OTHER): Payer: 59 | Admitting: Family Medicine

## 2022-01-11 ENCOUNTER — Other Ambulatory Visit (HOSPITAL_COMMUNITY)
Admission: RE | Admit: 2022-01-11 | Discharge: 2022-01-11 | Disposition: A | Discharge status: Home / Self Care | Payer: 59 | Source: Ambulatory Visit | Attending: Family Medicine | Admitting: Family Medicine

## 2022-01-11 VITALS — BP 109/63 | HR 71 | Ht 65.5 in | Wt 294.4 lb

## 2022-01-11 DIAGNOSIS — N898 Other specified noninflammatory disorders of vagina: Secondary | ICD-10-CM

## 2022-01-11 DIAGNOSIS — R350 Frequency of micturition: Secondary | ICD-10-CM | POA: Insufficient documentation

## 2022-01-11 LAB — POCT WET PREP (WET MOUNT)
Clue Cells Wet Prep Whiff POC: POSITIVE
Trichomonas Wet Prep HPF POC: ABSENT

## 2022-01-11 NOTE — Progress Notes (Signed)
    SUBJECTIVE:   CHIEF COMPLAINT / HPI:   Patient presents with vaginal odor, has noticed any significant vaginal discharge. Denies fever, chills or new pelvic pain. Last sexual encounter was 4 days ago. Has IUD for birth control. Denies dysuria but having increased urinary frequency. She admits she drinks a lot of water throughout the day but this urinary frequency has been more than previously.   OBJECTIVE:   BP 109/63   Pulse 71   Ht 5' 5.5" (1.664 m)   Wt 294 lb 6.4 oz (133.5 kg)   SpO2 100%   BMI 48.25 kg/m   General: Patient well-appearing, in no acute distress. Resp: Normal work of breathing noted MSK: no CVA tenderness noted bilaterally  GU: normal labia without rashes or external lesions noted normal cervix and vagina, no discharge or associated odor noted, no adnexal masses or tenderness noted, string of IUD noted   GU exam performed in the presence of chaperone, Lavell Anchors, CMA.   ASSESSMENT/PLAN:   Increased urinary frequency -pending UA to evaluate for UTI, also considered new diagnosis of DM so may consider A1c at future visit   Vaginal discharge -pending wet prep along with GC/Chlamydia  -patient politely declines blood work for complete STI testing -safe sex counseling  -IUD for birth control      -PHQ-9 score of 3 with negative question 9 reviewed.   Donney Dice, [place]

## 2022-01-11 NOTE — Assessment & Plan Note (Addendum)
-  pending UA to evaluate for UTI, also considered new diagnosis of DM so may consider A1c at future visit

## 2022-01-11 NOTE — Patient Instructions (Signed)
It was great seeing you today!  Today we discussed your symptoms, I will let you know the results of this testing.   Please follow up at your next scheduled appointment, if anything arises between now and then, please don't hesitate to contact our office.   Thank you for allowing [place] to be a part of your medical care!  Thank you, Dr. Larae Grooms  Also a reminder of our clinic's no-show policy. Please make sure to arrive at least 15 minutes prior to your scheduled appointment time. Please try to cancel before 24 hours if you are not able to make it. If you no-show for 2 appointments then you will be receiving a warning letter. If you no-show after 3 visits, then you may be at risk of being dismissed from our clinic. This is to ensure that everyone is able to be seen in a timely manner. Thank you, we appreciate your assistance with this!

## 2022-01-11 NOTE — Assessment & Plan Note (Addendum)
-  pending wet prep along with GC/Chlamydia  -patient politely declines blood work for complete STI testing -safe sex counseling  -IUD for birth control

## 2022-01-12 ENCOUNTER — Other Ambulatory Visit: Payer: Self-pay | Admitting: Family Medicine

## 2022-01-12 ENCOUNTER — Encounter: Payer: Self-pay | Admitting: Student

## 2022-01-12 DIAGNOSIS — N760 Acute vaginitis: Secondary | ICD-10-CM

## 2022-01-12 LAB — URINALYSIS, ROUTINE W REFLEX MICROSCOPIC
Bilirubin, UA: NEGATIVE
Glucose, UA: NEGATIVE
Leukocytes,UA: NEGATIVE
Nitrite, UA: NEGATIVE
RBC, UA: NEGATIVE
Specific Gravity, UA: >=1.03 — AB (ref 1.005–1.030)
Urobilinogen, Ur: 0.2 mg/dL (ref 0.2–1.0)
pH, UA: 6.5 (ref 5.0–7.5)

## 2022-01-12 MED ORDER — METRONIDAZOLE 500 MG PO TABS: 500.0000 mg | ORAL_TABLET | Freq: Two times a day (BID) | ORAL | 0 refills | Status: DC

## 2022-01-14 LAB — CERVICOVAGINAL ANCILLARY ONLY
Chlamydia: NEGATIVE
Comment: NEGATIVE
Comment: NEGATIVE
Comment: NORMAL
Neisseria Gonorrhea: NEGATIVE
Trichomonas: NEGATIVE

## 2022-01-14 MED ORDER — FLUCONAZOLE 150 MG PO TABS: 150.0000 mg | ORAL_TABLET | Freq: Once | ORAL | 0 refills | Status: AC

## 2022-01-18 ENCOUNTER — Ambulatory Visit (INDEPENDENT_AMBULATORY_CARE_PROVIDER_SITE_OTHER): Payer: 59 | Admitting: Family Medicine

## 2022-01-18 VITALS — BP 110/68 | HR 68 | Ht 65.5 in | Wt 288.0 lb

## 2022-01-18 DIAGNOSIS — U071 COVID-19: Secondary | ICD-10-CM

## 2022-01-18 DIAGNOSIS — R059 Cough, unspecified: Secondary | ICD-10-CM

## 2022-01-18 LAB — POC SOFIA 2 FLU + SARS ANTIGEN FIA
Influenza A, POC: NEGATIVE
Influenza B, POC: NEGATIVE
SARS Coronavirus 2 Ag: POSITIVE — AB

## 2022-01-18 MED ORDER — PAXLOVID (150/100) 10 X 150 MG & 10 X 100MG PO TBPK: ORAL_TABLET | ORAL | 0 refills | Status: DC

## 2022-01-18 NOTE — Patient Instructions (Addendum)
It was wonderful to see you today. Thank you for allowing me to be a part of your care. Below is a short summary of what we discussed at your visit today:  COVID You have COVID.  Take the Paxlovid as prescribed. You will take 3 pills twice daily for 5 days.  Stay home and quarantine through Monday 1/15.  You may return to work Tues 1/16, but you must wear a mask around others from Tues 1/16 through Sat 1/20.  Focus on hydration and rest.   IMPORTANT: While you are taking the COVID medication (Paxlovid), you must reduce the [name] - take [place] until you are DONE with the COVID medication.   --------------------------------------------------------  Fluids: make sure your child drinks enough water or Pedialyte; for older kids Gatorade is okay too. Signs of dehydration are not making tears or urinating less than once every 8-10 hours.  Treatment:  - give 1 tablespoon of honey 3-4 times a day.  - You can also mix honey and lemon in chamomille or peppermint tea.  - You can use nasal saline to loosen nose mucus - Place a humidifier next to your bed while she sleeps - If someone is taking a hot shower, in the bathroom to breathe in the steam - research studies show that honey works better than cough medicine.   Timeline:  - fever, runny nose, and fussiness get worse up to day 4 or 5, but then get better - it can take 2-3 weeks for cough to completely go away - cough may take weeks to resolve  Reasons to return for care include if: - having trouble eating  - acting very sleepy and not waking up to eat - having trouble breathing or turns blue - dehydrated    If you have any questions or concerns, please do not hesitate to contact us via phone or MyChart message.   Ezequiel Essex, MD

## 2022-01-18 NOTE — Progress Notes (Signed)
    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 Not currently on hydroxychloroquine, stopped a couple months ago under guidance of her dermatologist  PERTINENT  PMH / [place]:  Patient Active Problem List   Diagnosis Date Noted   COVID-19 01/18/2022   Increased urinary frequency 01/11/2022   Urinary frequency 12/03/2021   Pain due to dental caries 07/29/2021   Eye muscle twitches 07/29/2021   Long-term use of hydroxychloroquine 05/18/2021   Vaginal discharge 04/05/2021   [place] IUD (intrauterine device) in place since 11/25/2018 01/29/2021   Irregular bleeding 11/20/2020   Sarcoidosis of skin 07/07/2020   BV (bacterial vaginosis) 06/27/2020   Skin lumps 05/14/2020   Dysmenorrhea 12/22/2019   Foot pain, left 04/01/2019   Endometriosis determined by laparoscopy 10/02/2016   Fibroid 11/26/2011   Depression 07/28/2006   GERD 07/28/2006   Morbid obesity ([place]) 07/28/2006    OBJECTIVE:   BP 110/68   Pulse 68   Ht 5' 5.5" (1.664 m)   Wt 288 lb (130.6 kg)   SpO2 99%   BMI 47.20 kg/m    PHQ-9:     01/18/2022   11:32 AM 01/11/2022    3:26 PM 12/03/2021    3:45 PM  Depression screen PHQ 2/9  Decreased Interest 0 1 0  Down, Depressed, Hopeless 0 1 2  PHQ - 2 Score 0 2 2  Altered sleeping  1 3  Tired, decreased energy  0 0  Change in appetite  0 0  Feeling bad or failure about yourself   0 0  Trouble concentrating  0 0  Moving slowly or fidgety/restless  0 0  Suicidal thoughts  0 0  PHQ-9 Score  3 5  Difficult doing work/chores  Not difficult at all     Physical Exam General: Awake, alert, oriented Cardiovascular: Regular rate and  rhythm, S1 and S2 present, no murmurs auscultated Respiratory: Lung fields clear to auscultation bilaterally  ASSESSMENT/PLAN:   COVID-19 COVID positive on home test x2 and PCR in clinic. Risk factors for severe COVID include sarcoidosis of skin and obesity. Discussed with patient, who is amenable to Paxlovid treatment. On two medications that are not in [place] med interaction checker: HCG injections 3x weekly and Orilissa, both for endometriosis. Precepted with both pharmacist Dr. Valentina Lucks and attending Dr. Andria Frames; would recommend reduction of [name] to every other day only while on Paxlovid. Supportive care also recommended, see AVS for more. Provided CDC recommendations for home quarantine and subsequent masking, work note has these recommendations too.       Ezequiel Essex, MD [place]

## 2022-01-18 NOTE — Assessment & Plan Note (Signed)
COVID positive on home test x2 and PCR in clinic. Risk factors for severe COVID include sarcoidosis of skin and obesity. Discussed with patient, who is amenable to Paxlovid treatment. On two medications that are not in [place] med interaction checker: HCG injections 3x weekly and Orilissa, both for endometriosis. Precepted with both pharmacist Dr. Valentina Lucks and attending Dr. Andria Frames; would recommend reduction of [name] to every other day only while on Paxlovid. Supportive care also recommended, see AVS for more. Provided CDC recommendations for home quarantine and subsequent masking, work note has these recommendations too.

## 2022-01-22 ENCOUNTER — Telehealth: Payer: Self-pay

## 2022-01-22 DIAGNOSIS — U071 COVID-19: Secondary | ICD-10-CM

## 2022-01-22 DIAGNOSIS — R059 Cough, unspecified: Secondary | ICD-10-CM

## 2022-01-22 NOTE — Telephone Encounter (Signed)
Pharmacist calls nurse line in regards to [place].   Pharmacist reports conflicting quantity vs directions.   She reports #20 were called in, however the directions were written for #30 tablets.   Will forward to provider who saw patient.

## 2022-01-23 MED ORDER — PAXLOVID (150/100) 10 X 150 MG & 10 X 100MG PO TBPK: ORAL_TABLET | ORAL | 0 refills | Status: DC

## 2022-01-23 NOTE — Telephone Encounter (Signed)
Sent in new script for #30 tablets.  Unsure why there is an issue, as the script is for the "pre-pack" with appropriate number of tablets and I wrote for 1 pack.  Will re-send script.   Ezequiel Essex, MD

## 2022-03-21 ENCOUNTER — Telehealth: Payer: Self-pay | Admitting: Pharmacist

## 2022-03-21 ENCOUNTER — Ambulatory Visit: Payer: BLUE CROSS/BLUE SHIELD | Admitting: Family Medicine

## 2022-03-21 ENCOUNTER — Encounter: Payer: Self-pay | Admitting: Family Medicine

## 2022-03-21 VITALS — BP 120/70 | HR 75 | Ht 65.0 in | Wt 305.1 lb

## 2022-03-21 DIAGNOSIS — B3731 Acute candidiasis of vulva and vagina: Secondary | ICD-10-CM

## 2022-03-21 LAB — POCT WET PREP (WET MOUNT)
Clue Cells Wet Prep Whiff POC: NEGATIVE
Trichomonas Wet Prep HPF POC: ABSENT

## 2022-03-21 MED ORDER — FLUCONAZOLE 150 MG PO TABS: 150.0000 mg | ORAL_TABLET | ORAL | 0 refills | Status: AC

## 2022-03-21 MED ORDER — FLUCONAZOLE 150 MG PO TABS: 150.0000 mg | ORAL_TABLET | Freq: Once | ORAL | 0 refills | Status: DC

## 2022-03-21 NOTE — Progress Notes (Signed)
    SUBJECTIVE:   CHIEF COMPLAINT / HPI:  Chief Complaint  Patient presents with   Vaginal Itching    Since yesterday     Patient reports thick white vaginal discharge ongoing for few days as well as vaginal itching.  She thinks she may have a yeast infection as she has had frequent yeast and BV infections in the past.  Has 1 sexual partner, declines STI testing.  She reports she sometimes has to take 2 doses of fluconazole to treat the yeast infection and is requesting for 2 tablets.  PERTINENT  PMH / PSH: IUD in place  Patient Care Team: Eppie Gibson, MD as PCP - General (Family Medicine)   OBJECTIVE:   BP 120/70   Pulse 75   Ht 5\' 5"  (1.651 m)   Wt (!) 305 lb 2 oz (138.4 kg)   SpO2 100%   BMI 50.78 kg/m   Physical Exam Exam conducted with a chaperone present.  Constitutional:      General: She is not in acute distress. Cardiovascular:     Rate and Rhythm: Normal rate.  Pulmonary:     Effort: Pulmonary effort is normal. No respiratory distress.  Genitourinary:    General: Normal vulva.     Vagina: Vaginal discharge present.     Comments: Small amount of thick white vaginal discharge noted Musculoskeletal:     Cervical back: Neck supple.  Neurological:     Mental Status: She is alert.         {Show previous vital signs (optional):23777}    ASSESSMENT/PLAN:   1. Vaginal candidiasis Thick white vaginal discharge, wet prep positive for yeast.  Will treat with fluconazole, 2 tablets provided given history of refractory yeast infection. - POCT Wet Prep ([place]) - fluconazole (DIFLUCAN) 150 MG tablet; Take 1 tablet (150 mg total) by mouth every 3 (three) days for 2 doses.  Dispense: 2 tablet; Refill: 0    Return if symptoms worsen or fail to improve.   Zola Button, MD [place]

## 2022-03-21 NOTE — Telephone Encounter (Signed)
Prior authorization request received for [facility] prescription. PA submitted through CoverMyMeds and should have a response within 72 hours.   Thank you for allowing pharmacy to be a part of this patient's care.  Lolita Rieger, PharmD, BCPPS

## 2022-03-21 NOTE — Patient Instructions (Addendum)
It was nice seeing you today!  Take fluconazole for yeast infection.  Your test is negative for BV.  Stay well, Zola Button, MD [place] [phone]  --  Make sure to check out at the front desk before you leave today.  Please arrive at least 15 minutes prior to your scheduled appointments.  If you had blood work today, I will send you a MyChart message or a letter if results are normal. Otherwise, I will give you a call.  If you had a referral placed, they will call you to set up an appointment. Please give [place] a call if you don't hear back in the next 2 weeks.  If you need additional refills before your next appointment, please call your pharmacy first.

## 2022-03-29 ENCOUNTER — Other Ambulatory Visit: Payer: Self-pay | Admitting: Student

## 2022-03-29 DIAGNOSIS — Z1231 Encounter for screening mammogram for malignant neoplasm of breast: Secondary | ICD-10-CM

## 2022-04-05 IMAGING — US US PELVIS COMPLETE WITH TRANSVAGINAL
1 series · 15 of 25 positions shown · non-contrast
Comparison: 11/02/2015

CLINICAL DATA: Chronic pelvic pain, has IUD, LMP 12/30/2020, known
endometriosis and uterine fibroids, breakthrough bleeding



[Series 1: us pelvis complete with transvaginal · 94 acquisitions, 15 frames shown]
[im 1/94]
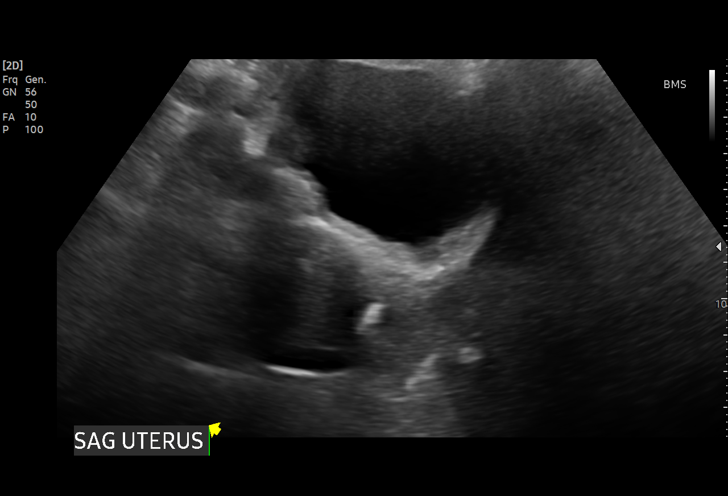
[im 8/94]
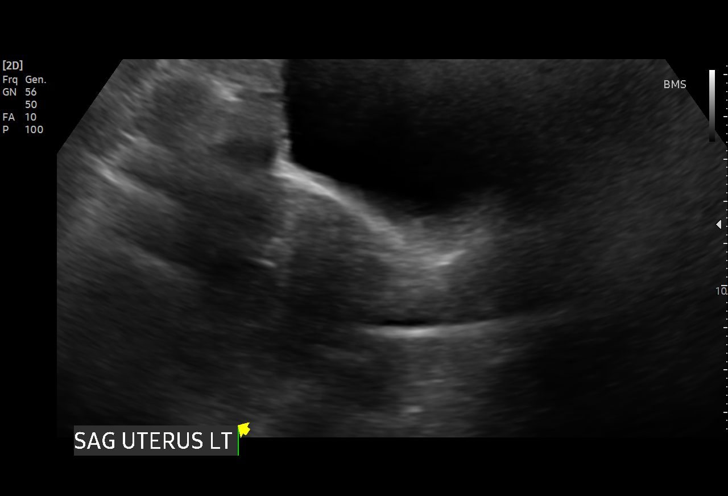
[im 16/94]
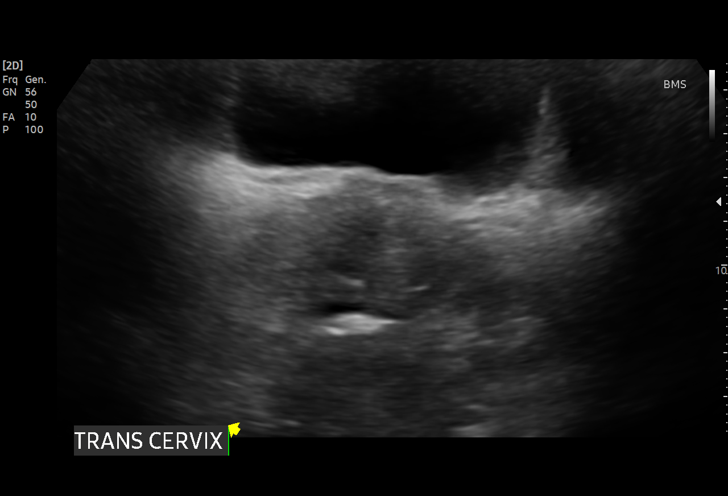
[im 20/94]
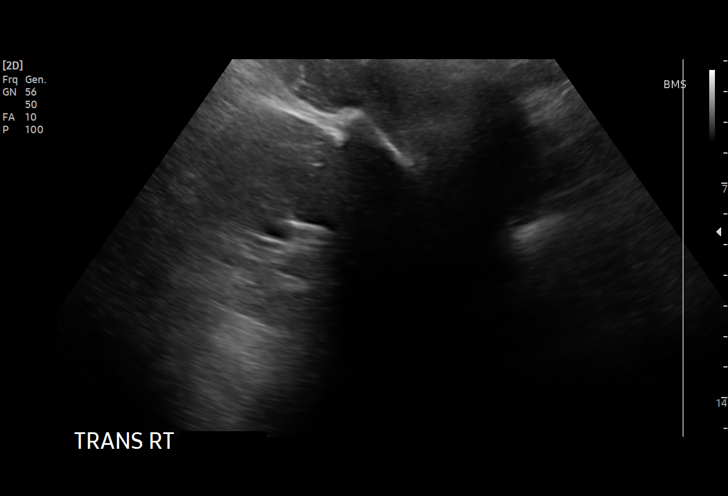
[im 28/94]
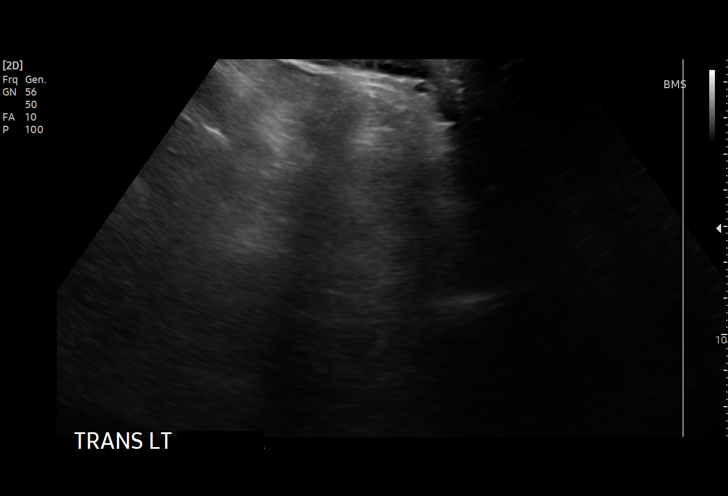
[im 35/94]
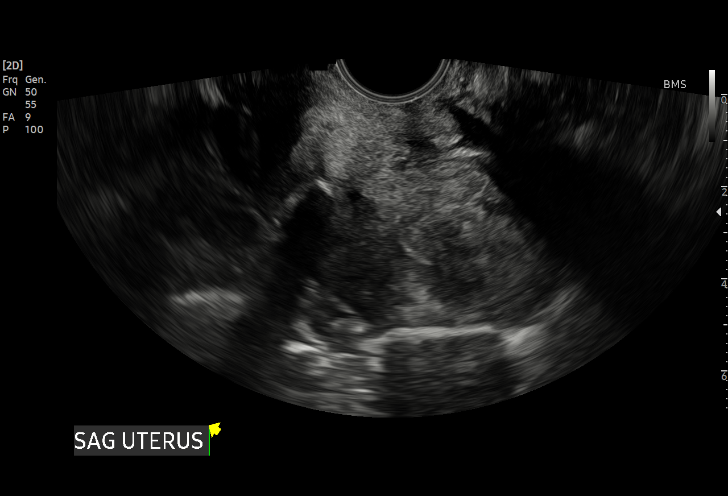
[im 39/94]
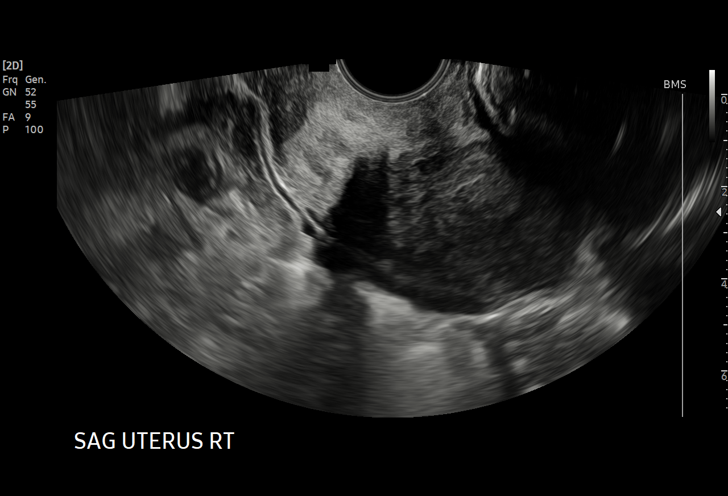
[im 47/94]
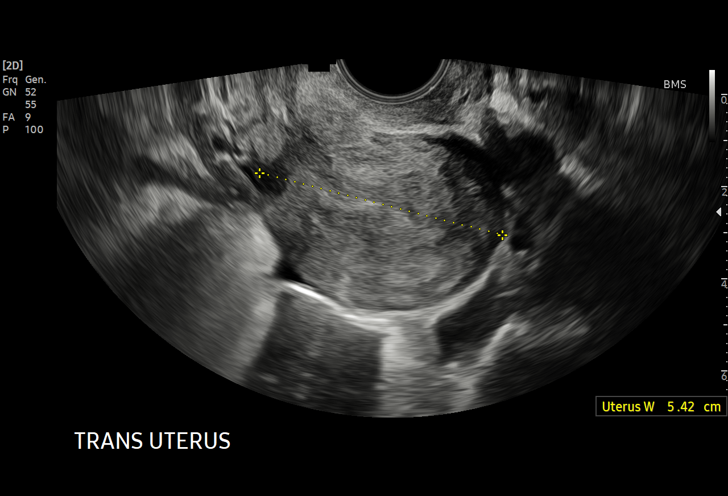
[im 55/94]
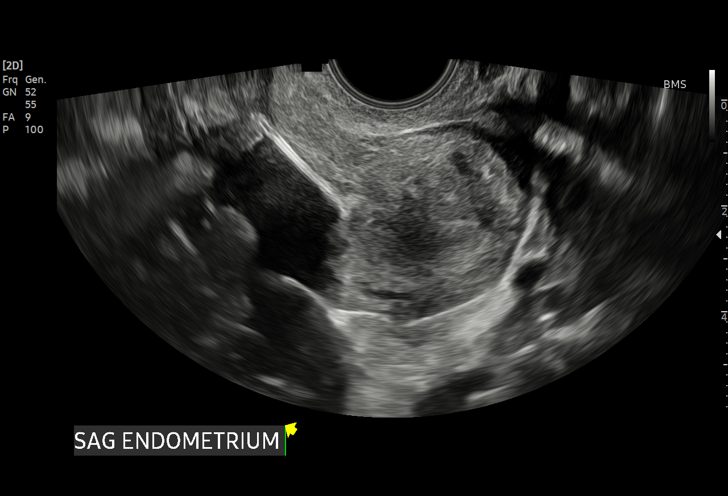
[im 59/94]
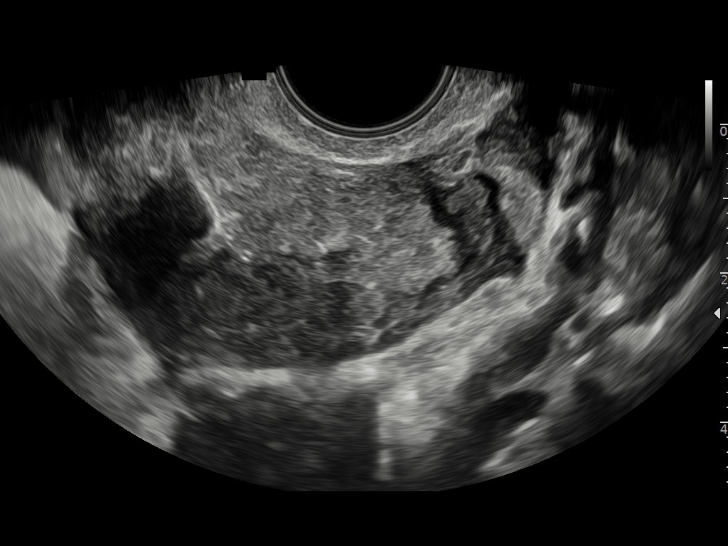
[im 66/94]
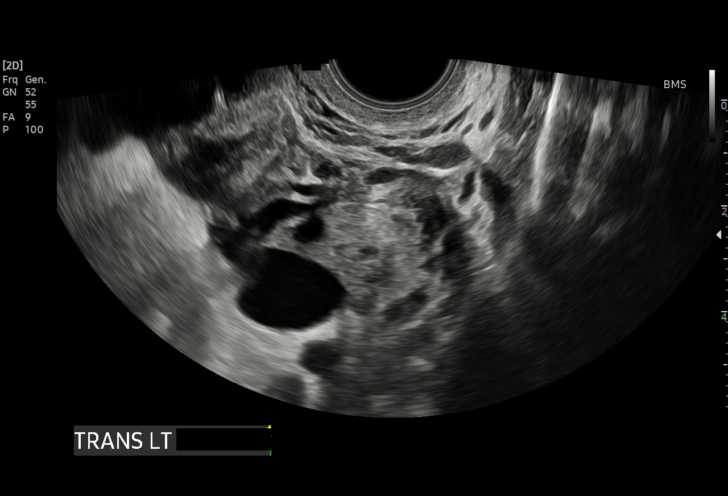
[im 74/94]
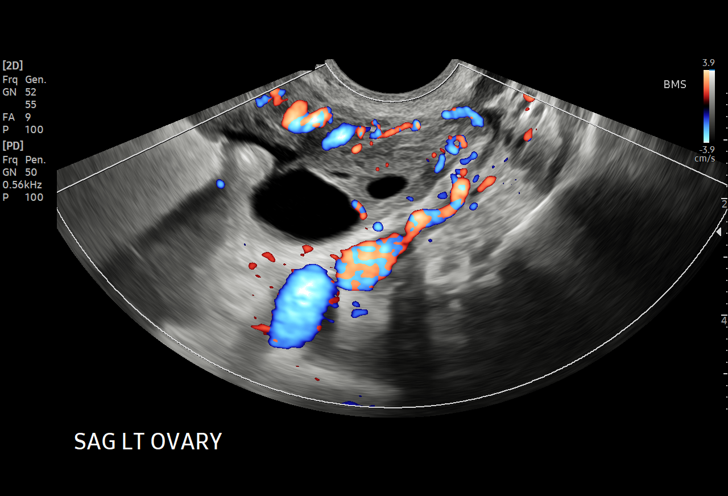
[im 78/94]
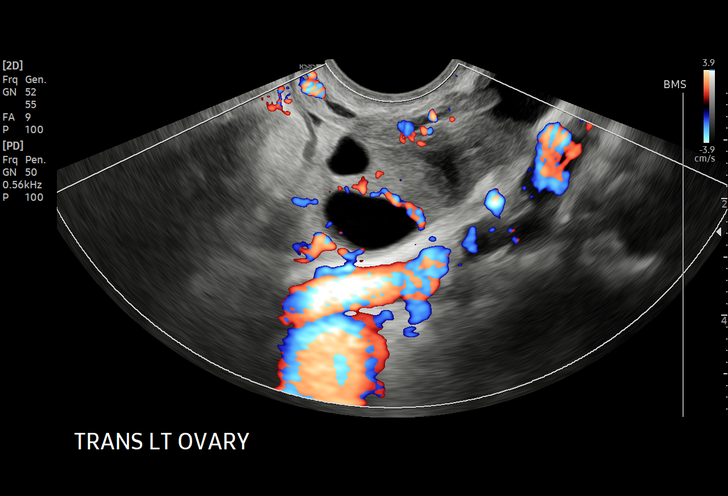
[im 86/94]
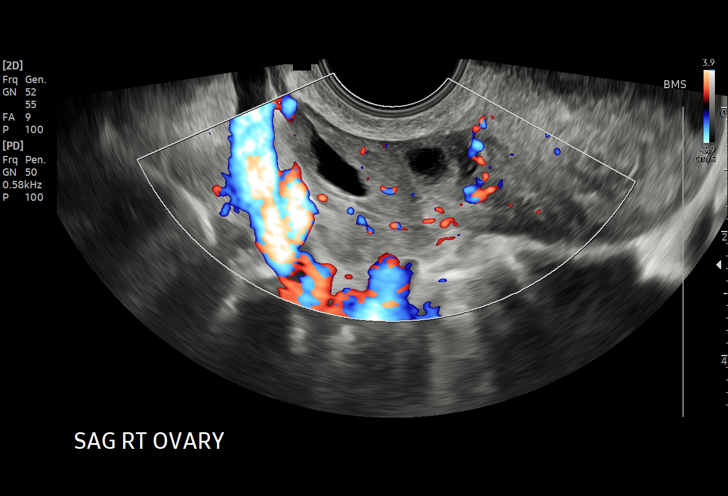
[im 94/94]
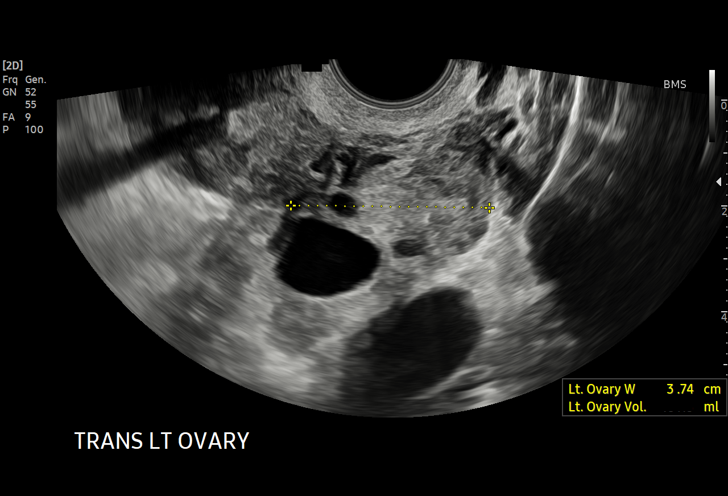

[15 of 25 positions shown; findings below may reference images not displayed]

FINDINGS: Uterus

Measurements: 5.8 x 4.0 x 5.9 cm = volume: 72 mL. Retroverted.
Heterogeneous echogenicity. Otherwise normal morphology without mass

Endometrium

Thickness: 4 mm. IUD at mid to lower uterine segments, question limb
extending to uterine fundus. No endometrial fluid

Right ovary

Measurements: 2.5 x 1.8 x 2.5 cm = volume: 5.8 mL. Normal morphology
without mass

Left ovary

Measurements: 4.6 x 2.0 x 3.7 cm = volume: 18.1 mL. Normal
morphology without mass

Other findings

Trace free pelvic fluid.  No adnexal masses.
IMPRESSION: IUD at mid to lower uterine segments with question 1 limb extending
to uterine fundus.

Otherwise negative exam.

## 2022-05-17 ENCOUNTER — Ambulatory Visit: Payer: BLUE CROSS/BLUE SHIELD | Admitting: Student

## 2022-05-17 ENCOUNTER — Other Ambulatory Visit: Payer: Self-pay

## 2022-05-17 ENCOUNTER — Ambulatory Visit
Admission: RE | Admit: 2022-05-17 | Discharge: 2022-05-17 | Disposition: A | Discharge status: Home / Self Care | Payer: BLUE CROSS/BLUE SHIELD | Source: Ambulatory Visit | Attending: Obstetrics & Gynecology | Admitting: Obstetrics & Gynecology

## 2022-05-17 ENCOUNTER — Encounter: Payer: Self-pay | Admitting: Student

## 2022-05-17 VITALS — BP 106/62 | HR 77 | Ht 65.0 in | Wt 245.1 lb

## 2022-05-17 DIAGNOSIS — D869 Sarcoidosis, unspecified: Secondary | ICD-10-CM

## 2022-05-17 DIAGNOSIS — Z01818 Encounter for other preprocedural examination: Secondary | ICD-10-CM | POA: Diagnosis not present

## 2022-05-17 DIAGNOSIS — Z1231 Encounter for screening mammogram for malignant neoplasm of breast: Secondary | ICD-10-CM

## 2022-05-17 NOTE — Progress Notes (Unsigned)
SUBJECTIVE:   Chief Complaint  Patient presents with   Procedure    Pt is here today for dental surgery clearance     Carla Hodges  is here for a Pre-operative physical at the request of Dr. Noe Gens DDS [facility]).  Pre op clearance due to history of sarcoidosis, no known cardiac or lung involvement.  She  is having restorative dental surgery on May 30.  The plan is for MAC, not general anesthesia.  Personal or family hx of adverse outcome to anesthesia? No  Chipped, cracked, missing, or loose teeth? Yes  Decreased ROM of neck? No  Able to walk up 2 flights of stairs without becoming significantly short of breath or having chest pain? Yes   Revised Goldman Criteria: High Risk Surgery (intraperitoneal, intrathoracic, aortic): No  Ischemic heart disease (Prior MI, +excercise stress test, angina, nitrate use, Qwave): No  History of congestive heart failure: No  History of cerebrovascular disease (TIA or stroke): No  Diabetes requiring preoperative treatment with insulin: No  Preoperative Cr >2.0: No   Revised Cardiac Risk Index Scoring - risk for death, MI, or cardiac arrest No risk factors -- 0.4% One risk factor -- 0.9%  Two risk factors -- 6.6%  Three or more risk factors -- >11%   Patient Active Problem List   Diagnosis Date Noted   COVID-19 01/18/2022   Increased urinary frequency 01/11/2022   Urinary frequency 12/03/2021   Pain due to dental caries 07/29/2021   Eye muscle twitches 07/29/2021   Long-term use of hydroxychloroquine 05/18/2021   Vaginal discharge 04/05/2021   [place] IUD (intrauterine device) in place since 11/25/2018 01/29/2021   Irregular bleeding 11/20/2020   Sarcoidosis of skin 07/07/2020   BV (bacterial vaginosis) 06/27/2020   Skin lumps 05/14/2020   Dysmenorrhea 12/22/2019   Foot pain, left 04/01/2019   Endometriosis determined by laparoscopy 10/02/2016   Fibroid 11/26/2011   Depression 07/28/2006   GERD 07/28/2006   Morbid obesity (H[facility]) 07/28/2006    Past Medical History:  Diagnosis Date   Breast nodule 11/06/2015   Bronchitis, chronic (HCC)    Depression    moderate   Fibroids    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/27/2015   Tuberculosis 2010   not active    Past Surgical History:  Procedure Laterality Date   HYSTEROSCOPY     2000   LAPAROSCOPY N/A 06/12/2016   Procedure: LAPAROSCOPY DIAGNOSTIC WITH PERITONEAL BIOSPSY;  Surgeon: Allie Bossier, MD;  Location: [facility];  Service: Gynecology;  Laterality: N/A;   LAPAROSCOPY N/A 06/12/2016   Procedure: LAPAROSCOPY DIAGNOSTIC, EVACUATION OF HEMATOMA;  Surgeon: Allie Bossier, MD;  Location: [facility];  Service: Gynecology;  Laterality: N/A;    Current Outpatient Medications  Medication Sig Dispense Refill   albuterol (VENTOLIN HFA) 108 (90 Base) MCG/ACT inhaler Inhale 2 puffs into the lungs every 4 (four) hours as needed for wheezing or shortness of breath. (Patient not taking: Reported on 01/29/2021) 8 g 0   CHORIOGONADOTROPIN ALFA [place] Inject 35 Units into the skin every Monday, Wednesday, and Friday.     hydroxychloroquine (PLAQUENIL) 200 MG tablet Take 200 mg by mouth 2 (two) times daily. (Patient not taking: Reported on 01/18/2022)     ibuprofen (ADVIL) 800 MG tablet Take 1 tablet (800 mg total) by mouth 3 (three) times daily with meals as needed for headache, moderate pain or cramping. 30 tablet 3   nirmatrelvir & ritonavir (PAXLOVID, 150/100,) 10 x 150 MG & 10 x 100MG  TBPK Nirmatrelvir  300 mg (two 150-mg tablets) with ritonavir 100 mg (one 100-mg tablet); administer all three tablets together orally twice daily with or without food for 5 days. 30 tablet 0   ORILISSA 150 MG TABS Take 1 tablet by mouth once daily 30 tablet 6   No current facility-administered medications for this visit.    Allergies  Allergen Reactions   Latex Other (See Comments)    "break out down there" with latex condom use    Social History   Socioeconomic History   Marital status: Single    Spouse  name: Not on file   Number of children: Not on file   Years of education: Not on file   Highest education level: Not on file  Occupational History   Not on file  Tobacco Use   Smoking status: Never    Passive exposure: Never   Smokeless tobacco: Never  Substance and Sexual Activity   Alcohol use: No   Drug use: Yes    Types: Marijuana   Sexual activity: Yes    Birth control/protection: None, Injection  Other Topics Concern   Not on file  Social History Narrative   Lives with grandmother in [place]. 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    Social Determinants of Health   Financial Resource Strain: Not on file  Food Insecurity: No Food Insecurity (01/29/2021)   Hunger Vital Sign    Worried About Running Out of Food in the Last Year: Never true    Ran Out of Food in the Last Year: Never true  Transportation Needs: No Transportation Needs (01/29/2021)   PRAPARE - Administrator, Civil Service (Medical): No    Lack of Transportation (Non-Medical): No  Physical Activity: Not on file  Stress: Not on file  Social Connections: Not on file  Intimate Partner Violence: Not on file    Family History  Problem Relation Age of Onset   Diabetes Maternal Uncle    Diabetes Maternal Uncle    Sarcoidosis Cousin    Alcohol abuse Neg Hx    Arthritis Neg Hx    Asthma Neg Hx    Birth defects Neg Hx    Cancer Neg Hx    COPD Neg Hx    Depression Neg Hx    Drug abuse Neg Hx    Early death Neg Hx    Hearing loss Neg Hx    Heart disease Neg Hx    Hyperlipidemia Neg Hx    Hypertension Neg Hx    Kidney disease Neg Hx    Learning disabilities Neg Hx    Mental illness Neg Hx    Mental retardation Neg Hx    Miscarriages / Stillbirths Neg Hx    Stroke Neg Hx    Vision loss Neg Hx      Review of Systems: Constitutional:  no unexpected change in weight, no weakness, no unexplained fevers, sweats, or chills Eye: no recent significant change in vision Ear: no  hearing loss Nose/Mouth/Throat: no dental complaints Neck/Thyroid: no lumps or masses Pulmonary: no chronic cough, sputum, or hemoptysis and no shortness of breath Cardiovascular: no exercise intolerance, no chest pain Gastrointestinal: no abdominal pain and no change in bowel habits GU: negative for dysuria, frequency, and incontinence Musculoskeletal/Extremities: no peripheral edema Skin/Integumentary ROS: no abnormal skin lesions reported Neurologic: no numbness, tingling, or tremor  OBJECTIVE:   Vitals:   05/17/22 1104 05/17/22 1123  BP: (!) 126/92 106/62  Pulse: 77   SpO2: 100%  Weight: 245 lb 1.6 oz (111.2 kg)   Height: 5\' 5"  (1.651 m)    Body mass index is 40.79 kg/m.  General:  well developed, well nourished, NAD Skin:  warm, no pallor or diaphoresis Head:  normocephalic, atraumatic Eyes:  pupils equal and round, sclera anicteric without injection Throat/Pharynx:  tongue and uvula midline; non-inflamed pharynx; multiple broken teeth, generally poor dentition Neck: neck supple without adenopathy, thyromegaly, or masses, no bruits, no jugular venous distention Lungs:  clear to auscultation, breath sounds equal bilaterally, no respiratory distress Cardio:  regular rate and rhythm without murmurs Abdomen:  abdomen soft, nontender Musculoskeletal:  symmetrical muscle groups noted without atrophy or deformity Extremities:  no clubbing, cyanosis, or edema, no deformities, no skin discoloration Neuro:  gait normal; deep tendon reflexes normal and symmetric and alert and oriented to person, place, and time Psych: Age appropriate judgment and insight; normal mood  ASSESSMENT/PLAN:     Orders Placed This Encounter  Procedures   EKG 12-Lead   EKG obtained due to history of sarcoid, no known cardiac involvement- normal EKG, normal sinus rhythm. The patient is deemed low cardiac risk for the proposed procedure.  The patient voiced understanding and agreement to the  plan.  No follow-ups on file. Eliezer Mccoy, MD 05/20/2022, 1:44 PM PGY-2, [facility]

## 2022-07-08 ENCOUNTER — Ambulatory Visit: Payer: BLUE CROSS/BLUE SHIELD | Admitting: Family Medicine

## 2022-07-08 ENCOUNTER — Ambulatory Visit: Payer: BLUE CROSS/BLUE SHIELD

## 2022-07-08 VITALS — BP 118/70 | HR 74 | Ht 65.0 in | Wt 327.0 lb

## 2022-07-08 DIAGNOSIS — N898 Other specified noninflammatory disorders of vagina: Secondary | ICD-10-CM

## 2022-07-08 DIAGNOSIS — N760 Acute vaginitis: Secondary | ICD-10-CM | POA: Diagnosis not present

## 2022-07-08 DIAGNOSIS — B9689 Other specified bacterial agents as the cause of diseases classified elsewhere: Secondary | ICD-10-CM

## 2022-07-08 LAB — POCT WET PREP (WET MOUNT)
Clue Cells Wet Prep Whiff POC: POSITIVE
Trichomonas Wet Prep HPF POC: ABSENT

## 2022-07-08 MED ORDER — FLUCONAZOLE 150 MG PO TABS
150.0000 mg | ORAL_TABLET | Freq: Once | ORAL | 0 refills | Status: AC
Start: 2022-07-08 — End: 2022-07-08

## 2022-07-08 MED ORDER — METRONIDAZOLE 500 MG PO TABS
500.0000 mg | ORAL_TABLET | Freq: Three times a day (TID) | ORAL | 0 refills | Status: DC
Start: 2022-07-08 — End: 2022-09-05

## 2022-07-08 NOTE — Patient Instructions (Signed)
I am sorry you are not feeling well.   I have done a vaginal swab and will let you know the results when I get them.   Please let us know if you start to feel worse.

## 2022-07-09 NOTE — Assessment & Plan Note (Addendum)
Test positive for BV. Patient has had yeast infection after treatment for BV multiple times in the past.  - consider boric acid or other alternative for recurrent BV at PCP appointment after improvement of symptoms  - Will treat with flagyl and diflucan post flagyl

## 2022-07-09 NOTE — Progress Notes (Signed)
    SUBJECTIVE:   CHIEF COMPLAINT / HPI:   Vaginal Discharge  Patient says that she has has been having increased discharge and irritation for the last two days. She states that she frequently gets BV and yeast infections. Sometimes yeast infections are after BV sometimes they are on their own. Patient states that she urinates after intercourse. Denies abdominal or pelvic pain. Says she has been with one sexual partner in the last 6 months and declines STI testing.   PERTINENT  PMH / PSH: frequent BV, endometriosis  OBJECTIVE:   BP 118/70   Pulse 74   Ht 5\' 5"  (1.651 m)   Wt (!) 327 lb (148.3 kg)   SpO2 99%   BMI 54.42 kg/m   General: well appearing, in no acute distress CV: RRR, radial pulses equal and palpable, no BLE edema  Resp: Normal work of breathing on room air, CTAB Abd: Soft, non tender, non distended  Neuro: Alert & Oriented x 4  GU (chaperoned by CMA): normal external genitalia, thick white opaque discharge in vaginal canal, no CMT    ASSESSMENT/PLAN:   Vaginal discharge -     POCT Wet Prep [facility]) -     metroNIDAZOLE; Take 1 tablet (500 mg total) by mouth 3 (three) times daily.  Dispense: 21 tablet; Refill: 0 -     Fluconazole; Take 1 tablet (150 mg total) by mouth once for 1 dose. Take additional pill two days later for continued symptoms.  Dispense: 2 tablet; Refill: 0  BV (bacterial vaginosis) Assessment & Plan: Test positive for BV. Patient has had yeast infection after treatment for BV multiple times in the past.  - consider boric acid or other alternative for recurrent BV at PCP appointment after improvement of symptoms  - Will treat with flagyl and diflucan post flagyl       Lockie Mola, MD [facility]

## 2022-07-16 ENCOUNTER — Other Ambulatory Visit: Payer: Self-pay | Admitting: Obstetrics and Gynecology

## 2022-07-16 DIAGNOSIS — G8929 Other chronic pain: Secondary | ICD-10-CM

## 2022-07-16 DIAGNOSIS — N809 Endometriosis, unspecified: Secondary | ICD-10-CM

## 2022-07-16 NOTE — Telephone Encounter (Signed)
Need a refill on /Orilissa    [facility] [place]

## 2022-07-17 MED ORDER — ORILISSA 150 MG PO TABS: 1.0000 | ORAL_TABLET | Freq: Every day | ORAL | 6 refills | Status: DC

## 2022-07-17 NOTE — Telephone Encounter (Signed)
Pt notified that medication request has been refilled and sent to requested pharmacy.  Pt verbalized understanding.  Carla Hodges  07/17/22

## 2022-07-25 ENCOUNTER — Ambulatory Visit: Payer: BLUE CROSS/BLUE SHIELD | Admitting: Family Medicine

## 2022-07-25 DIAGNOSIS — I872 Venous insufficiency (chronic) (peripheral): Secondary | ICD-10-CM | POA: Diagnosis not present

## 2022-07-25 LAB — POCT GLYCOSYLATED HEMOGLOBIN (HGB A1C): Hemoglobin A1C: 5 % (ref 4.0–5.6)

## 2022-07-25 NOTE — Assessment & Plan Note (Signed)
Chronic history of bilateral lower extremity swelling that worsens with prolonged standing.  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  Patient wears compression stockings which help.  1+ pitting edema in bilateral lower extremities.  CHF is also considered, however patient does not have shortness of breath, chest pain, hypertension that would suggest CHF.  Given bilateral nature, less likely DVT. -Recommend continuing compression stockings.  Also recommended doing exercises to help promote fluid drainage out of lower extremities.

## 2022-07-25 NOTE — Assessment & Plan Note (Addendum)
Is interested in lifestyle changes to help with weight loss.  Currently receiving hCG shots at a bariatric clinic every Monday Wednesday Friday.  Has family history of diabetes, reassuringly A1c normal today. - Referral to Dietician for weight loss

## 2022-07-25 NOTE — Progress Notes (Signed)
    SUBJECTIVE:   CHIEF COMPLAINT / HPI:   CW is a 42yo F w/ hx of obesity and endometriosis that p/w BL leg swelling - Has occasional swelling in both legs - has been off and on for past year - She works at [employer] and stands for many hours at a time.  Swelling worsens with prolonged standing. - Denies pain in legs - No SOB, chest pain, palpitations  Obesity - Has family hx of T2DM (particularly mom side of family)  - Also interwested in weight loss - Takes HCG shot at the Bariatric clinic. Gets it M/W/F every week  OBJECTIVE:   BP 128/79   Pulse 78   Ht 5\' 5"  (1.651 m)   Wt (!) 329 lb 3.2 oz (149.3 kg)   SpO2 98%   BMI 54.78 kg/m   General: Alert, pleasant woman. NAD. HEENT: NCAT. MMM. CV: RRR, no murmurs.  Resp: CTAB, no wheezing or crackles. Normal WOB on RA.  Abm: Soft, nontender, nondistended. BS present. Ext: 1+ pitting edema in BL LE. No erythema.  ASSESSMENT/PLAN:   Morbid obesity (HCC) Is interested in lifestyle changes to help with weight loss.  Currently receiving hCG shots at a bariatric clinic every Monday Wednesday Friday.  Has family history of diabetes, reassuringly A1c normal today. - Referral to Dietician for weight loss  Venous insufficiency Chronic history of bilateral lower extremity swelling that worsens with prolonged standing.  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  Patient wears compression stockings which help.  1+ pitting edema in bilateral lower extremities.  CHF is also considered, however patient does not have shortness of breath, chest pain, hypertension that would suggest CHF.  Given bilateral nature, less likely DVT. -Recommend continuing compression stockings.  Also recommended doing exercises to help promote fluid drainage out of lower extremities.   Lincoln Brigham, MD [facility]

## 2022-07-25 NOTE — Patient Instructions (Signed)
Good to see you today - Thank you for coming in  Things we discussed today:  1) For your leg swelling, this is most likely caused by fluid collecting in your legs from standing for long periods of time.  - Continue to wear your compression stocking. They can help squeeze fluid back out of your legs - You can also try to move your legs, walk in place, or go for short walks to help move the fluid out of your legs  2) For weight loss, I will send a referral for you to see our Dietician (Dr. Gerilyn Pilgrim in our clinic). She can work with you on making lifestyle changes.  3) We will check your A1c today.

## 2022-09-03 ENCOUNTER — Ambulatory Visit: Payer: BLUE CROSS/BLUE SHIELD

## 2022-09-05 ENCOUNTER — Ambulatory Visit: Payer: BLUE CROSS/BLUE SHIELD | Admitting: Student

## 2022-09-05 ENCOUNTER — Ambulatory Visit: Payer: BLUE CROSS/BLUE SHIELD

## 2022-09-05 VITALS — BP 128/76 | HR 62 | Ht 65.5 in | Wt 323.8 lb

## 2022-09-05 DIAGNOSIS — N898 Other specified noninflammatory disorders of vagina: Secondary | ICD-10-CM

## 2022-09-05 DIAGNOSIS — N760 Acute vaginitis: Secondary | ICD-10-CM

## 2022-09-05 DIAGNOSIS — B9689 Other specified bacterial agents as the cause of diseases classified elsewhere: Secondary | ICD-10-CM

## 2022-09-05 MED ORDER — METRONIDAZOLE 500 MG PO TABS: 500.0000 mg | ORAL_TABLET | Freq: Two times a day (BID) | ORAL | 0 refills | Status: DC

## 2022-09-05 MED ORDER — FLUCONAZOLE 150 MG PO TABS: 150.0000 mg | ORAL_TABLET | Freq: Once | ORAL | 0 refills | Status: AC

## 2022-09-05 NOTE — Patient Instructions (Signed)
It was great to see you! Thank you for allowing me to participate in your care!   Our plans for today:  - We are treating you for BV today  Metronidazole 500 mg, twice a day, for 7 days   Also giving you 2 doses of diflucan for if you develop a yeast infection  Diflucan to be taken as needed, take 2nd pill 72 hours apart from first, if needed.  Take care and seek immediate care sooner if you develop any concerns.   Dr. Bess Kinds, MD [facility]

## 2022-09-05 NOTE — Progress Notes (Signed)
  SUBJECTIVE:   CHIEF COMPLAINT / HPI:   BV concern Carla Hodges has had BV x 3 since January.   Today: Symptoms started Sunday, with discharge and odor started on Wednesday. Appreciates abdominal pain, but she has endometriosis. Last time sexually active was Tuesday/Monday, uses condoms, has IUD for contraception. Carla Hodges also request something for the yeast infection she get's after treatment.   PERTINENT  PMH / PSH:    Carla Hodges Care Team: Carla Amel, MD as PCP - General (Family Medicine) OBJECTIVE:  BP 128/76   Pulse 62   Ht 5' 5.5" (1.664 m)   Wt (!) 323 lb 12.8 oz (146.9 kg)   SpO2 96%   BMI 53.06 kg/m  Physical Exam Exam conducted with a chaperone present.  Genitourinary:    General: Normal vulva.     Labia:        Right: No rash, tenderness, lesion or injury.        Left: No rash, tenderness, lesion or injury.      Vagina: No signs of injury. Vaginal discharge present. No erythema, tenderness, bleeding or lesions.      ASSESSMENT/PLAN:  BV (bacterial vaginosis) Assessment & Plan: Carla Hodges comes in for concern of BV.  Carla Hodges notes symptoms started on Sunday of this week.  With discharge and odor.  Carla Hodges notes this is water symptoms usually present like for BV.  Carla Hodges comes in with frustration for BV testing. Carla Hodges has had 3 episodes of BV since January.  Carla Hodges uses condoms for protection, sexually active with 1 partner, who is sexually active with her only.  Carla Hodges denies any vaginal itching or burning.  Carla Hodges otherwise feeling well and in normal state of health.  Carla Hodges also requests treatment for BV, and yeast infection that results from treatment BV.  Carla Hodges has IUD, and uses condoms.  Given history and exam, will treat for BV.  Swabs attempted however no one present to read swabs, thus Dr. Manson Passey and I decided to treat the Carla Hodges empirically, with shared decision making with the Carla Hodges. - Metronidazole 500 twice daily x 7 days - Diflucan as  needed   Vaginal discharge -     metroNIDAZOLE; Take 1 tablet (500 mg total) by mouth 2 (two) times daily.  Dispense: 21 tablet; Refill: 0  Other orders -     Fluconazole; Take 1 tablet (150 mg total) by mouth once for 1 dose. Take 1 pill as needed, and can take 2nd pil 72 hours apart, if needed  Dispense: 2 tablet; Refill: 0   No follow-ups on file. Bess Kinds, MD 09/05/2022, 4:06 PM PGY-3, [facility]

## 2022-09-05 NOTE — Assessment & Plan Note (Addendum)
Patient comes in for concern of BV.  Patient notes symptoms started on Sunday of this week.  With discharge and odor.  Patient notes this is water symptoms usually present like for BV.  Patient comes in with frustration for BV testing. Patient has had 3 episodes of BV since January.  Patient uses condoms for protection, sexually active with 1 partner, who is sexually active with her only.  Patient denies any vaginal itching or burning.  Patient otherwise feeling well and in normal state of health.  Patient also requests treatment for BV, and yeast infection that results from treatment BV.  Patient has IUD, and uses condoms.  Given history and exam, will treat for BV.  Swabs attempted however no one present to read swabs, thus Dr. Manson Passey and I decided to treat the patient empirically, with shared decision making with the patient. - Metronidazole 500 twice daily x 7 days - Diflucan as needed

## 2022-09-19 ENCOUNTER — Ambulatory Visit: Payer: BLUE CROSS/BLUE SHIELD | Admitting: Student

## 2022-09-19 NOTE — Progress Notes (Deleted)
    SUBJECTIVE:   CHIEF COMPLAINT / HPI:   Treated for BV 2 weeks ago.  Had dental surgery back in May.   PERTINENT  PMH / PSH: ***  OBJECTIVE:   There were no vitals taken for this visit.  ***  ASSESSMENT/PLAN:   No problem-specific Assessment & Plan notes found for this encounter.     Eliezer Mccoy, MD [facility]

## 2022-09-26 ENCOUNTER — Encounter: Payer: Self-pay | Admitting: Student

## 2022-09-26 ENCOUNTER — Ambulatory Visit: Payer: BLUE CROSS/BLUE SHIELD | Admitting: Student

## 2022-09-26 VITALS — BP 118/72 | HR 65 | Ht 65.5 in | Wt 328.0 lb

## 2022-09-26 DIAGNOSIS — N760 Acute vaginitis: Secondary | ICD-10-CM

## 2022-09-26 DIAGNOSIS — L84 Corns and callosities: Secondary | ICD-10-CM | POA: Diagnosis not present

## 2022-09-26 DIAGNOSIS — B9689 Other specified bacterial agents as the cause of diseases classified elsewhere: Secondary | ICD-10-CM

## 2022-09-26 MED ORDER — METRONIDAZOLE 0.75 % VA GEL: 1.0000 | VAGINAL | 6 refills | Status: AC

## 2022-09-26 MED ORDER — UREA 10 % EX CREA: TOPICAL_CREAM | CUTANEOUS | 0 refills | Status: DC | PRN

## 2022-09-26 NOTE — Patient Instructions (Signed)
I think you have a corn on your foot. We can try a topical solution on this. Usually these are related to friction. So I think having a podiatrist weigh in on your footwear isnt the worst idea. Happy to place a referral.  Let's try to head off any future BV infections by doing a 6 month trial of twice weekly vaginal metrogel.  Message me if you have full blown symptoms.   Eliezer Mccoy, MD

## 2022-09-26 NOTE — Progress Notes (Signed)
    SUBJECTIVE:   CHIEF COMPLAINT / HPI:   Recurrent BV Treated for BV 3 weeks ago with resolution of acute symptoms. However, returns today frustrated that she has to keep coming in to have swabs done. Wondering what can be done to prevent recurrence in the future.   Left Foot Lesion Painful lesion to the outside of the left foot. Would really like to see podiatry. Thinks it may be related to te fit of her shoes. She is on her feet quite a bit at work.   OBJECTIVE:   BP 118/72   Pulse 65   Ht 5' 5.5" (1.664 m)   Wt (!) 328 lb (148.8 kg)   SpO2 98%   BMI 53.75 kg/m   Gen: Pleasant, in good spirits and NAD Resp: Normal WOB on RA L Foot: there is a hyperkeratotic lesion to the lateral aspect of her left foot. It is tender to palpation. There are no pinpoint capillaries visible in the lesion. See photo below.     ASSESSMENT/PLAN:   Bacterial vaginosis Recurrent issue. No current flare. To prevent future recurrence, we can trial twice-weekly metrogel x6 months. 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 The location and pain to palpation seems most consistent with this being a clavus, though the appearance is not entirely consistent.  - Will trial a topical keratinolytic (urea)  - I do think having her see podiatry to discuss footwear modifications is reasonable, referrral placed      J Dorothyann Gibbs, MD [facility]

## 2022-09-27 DIAGNOSIS — L84 Corns and callosities: Secondary | ICD-10-CM | POA: Insufficient documentation

## 2022-09-27 NOTE — Assessment & Plan Note (Addendum)
Recurrent issue. No current flare. To prevent future recurrence, we can trial twice-weekly metrogel x6 months. - Metronidazole vaginally twice weekly x6 months

## 2022-09-27 NOTE — Assessment & Plan Note (Signed)
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 The location and pain to palpation seems most consistent with this being a clavus, though the appearance is not entirely consistent.  - Will trial a topical keratinolytic (urea)  - I do think having her see podiatry to discuss footwear modifications is reasonable, referrral placed

## 2022-10-07 ENCOUNTER — Ambulatory Visit (INDEPENDENT_AMBULATORY_CARE_PROVIDER_SITE_OTHER): Payer: BLUE CROSS/BLUE SHIELD | Admitting: Podiatry

## 2022-10-07 ENCOUNTER — Telehealth: Payer: Self-pay | Admitting: Student

## 2022-10-07 DIAGNOSIS — Z91199 Patient's noncompliance with other medical treatment and regimen due to unspecified reason: Secondary | ICD-10-CM

## 2022-10-07 NOTE — Telephone Encounter (Signed)
[facility] but this doctor does not accept her insurance.  Needs another doctor that accepts [facility]

## 2022-10-07 NOTE — Telephone Encounter (Signed)
Insurance is with [facility]  ph # [phone]

## 2022-10-07 NOTE — Progress Notes (Signed)
No show

## 2022-10-15 ENCOUNTER — Telehealth: Payer: Self-pay | Admitting: Student

## 2022-10-15 NOTE — Telephone Encounter (Signed)
Patient walked in to request an extension of her Chiropractic care.  Need pre-authorization and the number to call is [phone] [facility] ph #  [phone]

## 2022-10-18 NOTE — Telephone Encounter (Signed)
Called patient to discuss. She already has an appointment set up for 10/15 with a DPM who accepts her insurance.

## 2022-10-18 NOTE — Telephone Encounter (Signed)
Discussed with patient over the phone. Will see her in person to discuss best path forward for neck and back pain.  I will see her on Oct 28 a 250pm.  Eliezer Mccoy, MD

## 2022-10-28 ENCOUNTER — Ambulatory Visit: Payer: BLUE CROSS/BLUE SHIELD | Admitting: Dietician

## 2022-11-04 ENCOUNTER — Ambulatory Visit: Payer: BLUE CROSS/BLUE SHIELD | Admitting: Student

## 2022-11-04 ENCOUNTER — Other Ambulatory Visit: Payer: Self-pay

## 2022-11-04 ENCOUNTER — Encounter: Payer: Self-pay | Admitting: Student

## 2022-11-04 VITALS — BP 127/88 | HR 72 | Ht 65.0 in | Wt 333.2 lb

## 2022-11-04 DIAGNOSIS — M542 Cervicalgia: Secondary | ICD-10-CM

## 2022-11-04 DIAGNOSIS — Z1159 Encounter for screening for other viral diseases: Secondary | ICD-10-CM

## 2022-11-04 DIAGNOSIS — Z23 Encounter for immunization: Secondary | ICD-10-CM

## 2022-11-04 DIAGNOSIS — G8929 Other chronic pain: Secondary | ICD-10-CM

## 2022-11-04 NOTE — Patient Instructions (Addendum)
Carla Hodges  It was lovely seeing you in clinic today! You came in for your Hepatitis C screening.  Thank you for allowing [place] to be a part of your care team! Governor Rooks, medical student Dr. Dorothyann Gibbs

## 2022-11-04 NOTE — Progress Notes (Signed)
    SUBJECTIVE:   CHIEF COMPLAINT / HPI:   Carla Hodges is a 42 y.o. female who presents today for Hepatitis C screening.  She has no concerns today. She reports her grandmother had Hep C, and so wants to get this screening done.  Goes to chiropractor for neck and back pain. She is hoping to have our clinic call to see about getting more visits covered by insurance. She is not interested in further workup at this time from our end.  No interest in flu or COVID booster.  PERTINENT  PMH / PSH: obesity  OBJECTIVE:   BP 127/88   Pulse 72   Ht 5\' 5"  (1.651 m)   Wt (!) 333 lb 3.2 oz (151.1 kg)   SpO2 100%   BMI 55.45 kg/m   General: Pt is seated in chair, no acute distress. Cardiovascular: RRR, no murmurs, rubs, gallops. Pulmonary: Normal work of breathing. Lungs clear to auscultation bilaterally. MSK: Normal range of motion of neck in all planes. No gross deficiencies of strength or sensation noted of upper back and neck. No midline tenderness or deformity.  Neuro/Psych: Alert and oriented to person, place, event, time. Normal affect.  ASSESSMENT/PLAN:   Healthcare Maintenance Pt is receiving Hep C screening today. Tdap today also.   Neck and Back Pain Pt currently seeing chiropractor for management; does not want further workup or treatment from our clinic at this time. Has a benign exam in clinic today. Requesting call to insurance for approval of more chiropractor visits. - Will call insurance; if denied, pt willing to do more workup in this clinic   Governor Rooks, Medical Student [facility]   I have evaluated this patient along with medical student Governor Rooks and reviewed the above note, making necessary revisions.  Dorothyann Gibbs, MD 11/04/2022, 3:33 PM PGY-3, [facility]

## 2022-11-05 ENCOUNTER — Encounter: Payer: Self-pay | Admitting: Student

## 2022-11-05 LAB — HCV INTERPRETATION

## 2022-11-05 LAB — HCV AB W REFLEX TO QUANT PCR: HCV Ab: NONREACTIVE

## 2023-01-18 ENCOUNTER — Other Ambulatory Visit: Payer: Self-pay | Admitting: Obstetrics and Gynecology

## 2023-01-18 ENCOUNTER — Other Ambulatory Visit: Payer: Self-pay | Admitting: Student

## 2023-01-18 DIAGNOSIS — N809 Endometriosis, unspecified: Secondary | ICD-10-CM

## 2023-01-18 DIAGNOSIS — G8929 Other chronic pain: Secondary | ICD-10-CM

## 2023-01-21 ENCOUNTER — Other Ambulatory Visit: Payer: Self-pay

## 2023-01-21 ENCOUNTER — Encounter (HOSPITAL_COMMUNITY): Payer: Self-pay | Admitting: Emergency Medicine

## 2023-01-21 ENCOUNTER — Emergency Department (HOSPITAL_COMMUNITY)
Admission: EM | Admit: 2023-01-21 | Discharge: 2023-01-21 | Disposition: A | Discharge status: Home / Self Care | Payer: BLUE CROSS/BLUE SHIELD | Attending: Emergency Medicine | Admitting: Emergency Medicine

## 2023-01-21 ENCOUNTER — Telehealth: Payer: Self-pay | Admitting: Lactation Services

## 2023-01-21 ENCOUNTER — Emergency Department (HOSPITAL_COMMUNITY): Payer: BLUE CROSS/BLUE SHIELD

## 2023-01-21 DIAGNOSIS — Y69 Unspecified misadventure during surgical and medical care: Secondary | ICD-10-CM | POA: Diagnosis not present

## 2023-01-21 DIAGNOSIS — Z91040 Latex allergy status: Secondary | ICD-10-CM | POA: Insufficient documentation

## 2023-01-21 DIAGNOSIS — T8332XA Displacement of intrauterine contraceptive device, initial encounter: Secondary | ICD-10-CM | POA: Diagnosis not present

## 2023-01-21 DIAGNOSIS — R109 Unspecified abdominal pain: Secondary | ICD-10-CM | POA: Insufficient documentation

## 2023-01-21 LAB — COMPREHENSIVE METABOLIC PANEL
ALT: 14 U/L (ref 0–44)
AST: 15 U/L (ref 15–41)
Albumin: 3.7 g/dL (ref 3.5–5.0)
Alkaline Phosphatase: 81 U/L (ref 38–126)
Anion gap: 8 (ref 5–15)
BUN: 9 mg/dL (ref 6–20)
CO2: 24 mmol/L (ref 22–32)
Calcium: 8.3 mg/dL — ABNORMAL LOW (ref 8.9–10.3)
Chloride: 102 mmol/L (ref 98–111)
Creatinine, Ser: 0.8 mg/dL (ref 0.44–1.00)
GFR, Estimated: >60 mL/min (ref >60)
Glucose, Bld: 94 mg/dL (ref 70–99)
Potassium: 3.2 mmol/L — ABNORMAL LOW (ref 3.5–5.1)
Sodium: 134 mmol/L — ABNORMAL LOW (ref 135–145)
Total Bilirubin: 0.9 mg/dL (ref 0.0–1.2)
Total Protein: 7.3 g/dL (ref 6.5–8.1)

## 2023-01-21 LAB — URINALYSIS, ROUTINE W REFLEX MICROSCOPIC
Cellular Cast, UA: 19
Glucose, UA: NEGATIVE mg/dL
Ketones, ur: 5 mg/dL — AB
Leukocytes,Ua: NEGATIVE
Nitrite: NEGATIVE
Protein, ur: 100 mg/dL — AB
Specific Gravity, Urine: 1.039 — ABNORMAL HIGH (ref 1.005–1.030)
pH: 5 (ref 5.0–8.0)

## 2023-01-21 LAB — CBC
HCT: 40.8 % (ref 36.0–46.0)
Hemoglobin: 12.9 g/dL (ref 12.0–15.0)
MCH: 29.4 pg (ref 26.0–34.0)
MCHC: 31.6 g/dL (ref 30.0–36.0)
MCV: 92.9 fL (ref 80.0–100.0)
Platelets: 263 10*3/uL (ref 150–400)
RBC: 4.39 MIL/uL (ref 3.87–5.11)
RDW: 14 % (ref 11.5–15.5)
WBC: 4.9 10*3/uL (ref 4.0–10.5)
nRBC: 0 % (ref 0.0–0.2)

## 2023-01-21 LAB — MAGNESIUM: Magnesium: 2.3 mg/dL (ref 1.7–2.4)

## 2023-01-21 LAB — LIPASE, BLOOD: Lipase: 66 U/L — ABNORMAL HIGH (ref 11–51)

## 2023-01-21 LAB — HCG, SERUM, QUALITATIVE: Preg, Serum: NEGATIVE

## 2023-01-21 MED ORDER — ONDANSETRON 4 MG PO TBDP: 4.0000 mg | ORAL_TABLET | Freq: Three times a day (TID) | ORAL | 0 refills | Status: DC | PRN

## 2023-01-21 MED ORDER — MORPHINE SULFATE (PF) 4 MG/ML IV SOLN
4.0000 mg | Freq: Once | INTRAVENOUS | Status: AC
Start: 2023-01-21 — End: 2023-01-21
  Administered 2023-01-21: 4 mg via INTRAVENOUS
  Filled 2023-01-21: qty 1

## 2023-01-21 MED ORDER — POTASSIUM CHLORIDE CRYS ER 20 MEQ PO TBCR
40.0000 meq | EXTENDED_RELEASE_TABLET | Freq: Once | ORAL | Status: AC
  Administered 2023-01-21: 40 meq via ORAL
  Filled 2023-01-21: qty 2

## 2023-01-21 MED ORDER — IOHEXOL 300 MG/ML  SOLN
100.0000 mL | Freq: Once | INTRAMUSCULAR | Status: AC | PRN
  Administered 2023-01-21: 100 mL via INTRAVENOUS

## 2023-01-21 MED ORDER — ONDANSETRON HCL 4 MG/2ML IJ SOLN
4.0000 mg | Freq: Once | INTRAMUSCULAR | Status: AC
  Administered 2023-01-21: 4 mg via INTRAVENOUS
  Filled 2023-01-21: qty 2

## 2023-01-21 MED ORDER — LACTATED RINGERS IV BOLUS
1000.0000 mL | Freq: Once | INTRAVENOUS | Status: AC
  Administered 2023-01-21: 1000 mL via INTRAVENOUS

## 2023-01-21 MED ORDER — OXYCODONE HCL 5 MG PO TABS: 5.0000 mg | ORAL_TABLET | ORAL | 0 refills | Status: DC | PRN

## 2023-01-21 NOTE — ED Provider Notes (Signed)
 [place] EMERGENCY DEPARTMENT AT [facility] Provider Note   CSN: [id number] Arrival date & time: 01/21/23  9685     History  Chief Complaint  Patient presents with   Abdominal Pain   Emesis    Carla Hodges is a 43 y.o. female.  43 year old female presents today for concern of left-sided abdominal pain associated with nausea and vomiting.  She has history of skin psoriasis.  She denies any hematemesis, melanotic stools.  She states this started after she had some granola bar and shift ended.  Pain has not improved since onset.  She states prior to arrival to the emergency department it was severe.  She states she spent most of the time laying in bed.  Has not tolerated p.o. intake since Sunday.  She states her symptoms are starting to improve.  She has been able to tolerate some crackers prior to arrival.  The history is provided by the patient. No language interpreter was used.       Home Medications Prior to Admission medications   Medication Sig Start Date End Date Taking? Authorizing Provider  albuterol  (VENTOLIN  HFA) 108 (90 Base) MCG/ACT inhaler Inhale 2 puffs into the lungs every 4 (four) hours as needed for wheezing or shortness of breath. Patient not taking: Reported on 01/29/2021 08/25/19   Jarrett Lucie SAILOR, DO  CHORIOGONADOTROPIN ALFA [place] Inject 35 Units into the skin every Monday, Wednesday, and Friday.    [provider]  Elagolix Sodium  (ORILISSA ) 150 MG TABS Take 1 tablet (150 mg total) by mouth daily. 07/17/22   Zina Jerilynn LABOR, MD  hydroxychloroquine (PLAQUENIL) 200 MG tablet Take 200 mg by mouth 2 (two) times daily. Patient not taking: Reported on 01/18/2022 08/27/20   [provider]  ibuprofen  (ADVIL ) 800 MG tablet Take 1 tablet (800 mg total) by mouth 3 (three) times daily with meals as needed for headache, moderate pain or cramping. 01/29/21   Anyanwu, Gloris LABOR, MD  metroNIDAZOLE  (METROGEL ) 0.75 % vaginal gel Place 1  Applicatorful vaginally 2 (two) times a week. 09/26/22 03/26/23  Marlee Lynwood NOVAK, MD  urea  (CARMOL) 10 % cream Apply topically as needed. 09/26/22   Marlee Lynwood NOVAK, MD      Allergies    Latex    Review of Systems   Review of Systems  Constitutional:  Negative for chills and fever.  Respiratory:  Negative for shortness of breath.   Cardiovascular:  Negative for chest pain.  Gastrointestinal:  Positive for abdominal pain, diarrhea, nausea and vomiting.  Neurological:  Negative for light-headedness.  All other systems reviewed and are negative.   Physical Exam Updated Vital Signs BP 118/75 (BP Location: Left Arm)   Pulse 66   Temp 98.9 F (37.2 C) (Oral)   Resp 16   Wt (!) 151.1 kg   LMP  (LMP Unknown)   SpO2 100%   BMI 55.43 kg/m  Physical Exam Vitals and nursing note reviewed.  Constitutional:      General: She is not in acute distress.    Appearance: Normal appearance. She is not ill-appearing.  HENT:     Head: Normocephalic and atraumatic.     Nose: Nose normal.  Eyes:     Conjunctiva/sclera: Conjunctivae normal.  Cardiovascular:     Rate and Rhythm: Normal rate and regular rhythm.  Pulmonary:     Effort: Pulmonary effort is normal. No respiratory distress.  Abdominal:     General: There is no distension.  Palpations: Abdomen is soft.     Tenderness: There is abdominal tenderness. There is no guarding.  Musculoskeletal:        General: No deformity. Normal range of motion.     Cervical back: Normal range of motion.  Skin:    Findings: No rash.  Neurological:     Mental Status: She is alert.     ED Results / Procedures / Treatments   Labs (all labs ordered are listed, but only abnormal results are displayed) Labs Reviewed  LIPASE, BLOOD - Abnormal; Notable for the following components:      Result Value   Lipase 66 (*)    All other components within normal limits  COMPREHENSIVE METABOLIC PANEL - Abnormal; Notable for the following components:    Sodium 134 (*)    Potassium 3.2 (*)    Calcium 8.3 (*)    All other components within normal limits  URINALYSIS, ROUTINE W REFLEX MICROSCOPIC - Abnormal; Notable for the following components:   Color, Urine AMBER (*)    APPearance HAZY (*)    Specific Gravity, Urine 1.039 (*)    Hgb urine dipstick SMALL (*)    Bilirubin Urine SMALL (*)    Ketones, ur 5 (*)    Protein, ur 100 (*)    Bacteria, UA RARE (*)    All other components within normal limits  CBC  HCG, SERUM, QUALITATIVE  MAGNESIUM    EKG None  Radiology No results found.  Procedures Procedures    Medications Ordered in ED Medications  lactated ringers  bolus 1,000 mL (1,000 mLs Intravenous New Bag/Given 01/21/23 1632)  ondansetron  (ZOFRAN ) injection 4 mg (4 mg Intravenous Given 01/21/23 1632)  morphine  (PF) 4 MG/ML injection 4 mg (4 mg Intravenous Given 01/21/23 1631)  iohexol  (OMNIPAQUE ) 300 MG/ML solution 100 mL (100 mLs Intravenous Contrast Given 01/21/23 1643)    ED Course/ Medical Decision Making/ A&P                                 Medical Decision Making Amount and/or Complexity of Data Reviewed Labs: ordered. Radiology: ordered.  Risk Prescription drug management.   Medical Decision Making / ED Course   This patient presents to the ED for concern of abdominal pain, nausea, vomiting, this involves an extensive number of treatment options, and is a complaint that carries with it a high risk of complications and morbidity.  The differential diagnosis includes gastroenteritis, diverticulitis, colitis, pancreatitis  MDM: 43 year old female presents today for concern of left-sided abdominal pain associated with nausea and vomiting.  She also had diarrhea which has improved.  No melanotic stools, hematemesis.  CBC is unremarkable.  CMP shows potassium 3.2 otherwise without acute concern.  UA shows no acute concerns.  Pregnancy test negative.  Lipase 66.  Not at the level to meet pancreatitis criteria.   Magnesium 2.3.  Potassium repletion ordered.  CT abdomen pelvis obtained.  Shows chronically malpositioned IUD.  It is in the endometrium and perforated the serosa.  Discussed with gynecology.  They recommend they would like to see patient in clinic and they will remove the IUD.  Nothing that needs to be done tonight.   Lab Tests: -I ordered, reviewed, and interpreted labs.   The pertinent results include:   Labs Reviewed  LIPASE, BLOOD - Abnormal; Notable for the following components:      Result Value   Lipase 66 (*)    All  other components within normal limits  COMPREHENSIVE METABOLIC PANEL - Abnormal; Notable for the following components:   Sodium 134 (*)    Potassium 3.2 (*)    Calcium 8.3 (*)    All other components within normal limits  URINALYSIS, ROUTINE W REFLEX MICROSCOPIC - Abnormal; Notable for the following components:   Color, Urine AMBER (*)    APPearance HAZY (*)    Specific Gravity, Urine 1.039 (*)    Hgb urine dipstick SMALL (*)    Bilirubin Urine SMALL (*)    Ketones, ur 5 (*)    Protein, ur 100 (*)    Bacteria, UA RARE (*)    All other components within normal limits  CBC  HCG, SERUM, QUALITATIVE  MAGNESIUM      EKG  EKG Interpretation Date/Time:    Ventricular Rate:    PR Interval:    QRS Duration:    QT Interval:    QTC Calculation:   R Axis:      Text Interpretation:           Imaging Studies ordered: I ordered imaging studies including CT abdomen pelvis with contrast I independently visualized and interpreted imaging. I agree with the radiologist interpretation   Medicines ordered and prescription drug management: Meds ordered this encounter  Medications   lactated ringers  bolus 1,000 mL   ondansetron  (ZOFRAN ) injection 4 mg   morphine  (PF) 4 MG/ML injection 4 mg   iohexol  (OMNIPAQUE ) 300 MG/ML solution 100 mL    -I have reviewed the patients home medicines and have made adjustments as needed  Consultations Obtained: I  requested consultation with the gynecology,  and discussed lab and imaging findings as well as pertinent plan - they recommend: As above   Reevaluation: After the interventions noted above, I reevaluated the patient and found that they have :improved  Co morbidities that complicate the patient evaluation  Past Medical History:  Diagnosis Date   Breast nodule 11/06/2015   Bronchitis, chronic (HCC)    Depression    moderate   Fibroids    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  Return precaution discussed.  GYN referral given.     Final Clinical Impression(s) / ED Diagnoses Final diagnoses:  Abdominal pain, unspecified abdominal location  Malpositioned intrauterine device (IUD), initial encounter    Rx / DC Orders ED Discharge Orders          Ordered    oxyCODONE  (ROXICODONE ) 5 MG immediate release tablet  Every 4 hours PRN        01/21/23 2233    ondansetron  (ZOFRAN -ODT) 4 MG disintegrating tablet  Every 8 hours PRN        01/21/23 2234              Hildegard Loge, PA-C 01/21/23 2234    Cottie Donnice PARAS, MD 01/21/23 [phone]

## 2023-01-21 NOTE — Telephone Encounter (Signed)
-----   Message from Jerilynn DELENA Buddle sent at 01/20/2023 10:22 AM EST ----- Regarding: serapio refill It appears that the patient has been on orlissa for 2 years, starting on 01/2021.  By my math it has been 2 years.  Per most recommendations it would be advised to stop the orlissa and possibly go on depo provera  or some other prolonged hormonal interview.  Advise follow up with Dr. Herchel since she appears to have started her on the Orlissa or myself since I have seen her once in passing.  Dr. DELENA would be preferable, but either is fine.  Buddle, MD

## 2023-01-21 NOTE — Discharge Instructions (Signed)
 Your workup shows that your IUD is malpositioned.  I discussed this with the gynecologist.  They would like to see you in clinic to remove this.  For any emergent symptoms return to the emergency room.  Part of the IUD is perforated through the uterus.  Gynecologist does not believe this is any thing that is emergently concerning.  Please call their clinic tomorrow to schedule an appointment.  I have sent in some pain medication along with some nausea medication into the pharmacy for you.

## 2023-01-21 NOTE — ED Triage Notes (Signed)
 Pt in with low abdominal pain, emesis and diarrhea since Sunday afternoon. Pt thinks this may have been related to some unrefrigerated chip dip she ate that day. Emesis x 3 each day since, still having diarrhea, denies any fevers. Reports inability to keep any food/drink down since

## 2023-01-21 NOTE — Telephone Encounter (Signed)
 Called and spoke with patient. Reviewed what Dr. Donavan Foil Recommended. Will send message to front office to schedule patient with Dr. Macon Large or Dr. Donavan Foil.

## 2023-01-26 ENCOUNTER — Ambulatory Visit (INDEPENDENT_AMBULATORY_CARE_PROVIDER_SITE_OTHER): Payer: BLUE CROSS/BLUE SHIELD

## 2023-01-26 ENCOUNTER — Ambulatory Visit (HOSPITAL_COMMUNITY)
Admission: EM | Admit: 2023-01-26 | Discharge: 2023-01-26 | Disposition: A | Discharge status: Home / Self Care | Payer: BLUE CROSS/BLUE SHIELD | Attending: Emergency Medicine | Admitting: Emergency Medicine

## 2023-01-26 ENCOUNTER — Encounter (HOSPITAL_COMMUNITY): Payer: Self-pay

## 2023-01-26 DIAGNOSIS — J069 Acute upper respiratory infection, unspecified: Secondary | ICD-10-CM | POA: Diagnosis not present

## 2023-01-26 DIAGNOSIS — R051 Acute cough: Secondary | ICD-10-CM

## 2023-01-26 MED ORDER — ALBUTEROL SULFATE (2.5 MG/3ML) 0.083% IN NEBU
INHALATION_SOLUTION | RESPIRATORY_TRACT | Status: AC
Start: 2023-01-26 — End: ?
  Filled 2023-01-26: qty 3

## 2023-01-26 MED ORDER — IPRATROPIUM-ALBUTEROL 0.5-2.5 (3) MG/3ML IN SOLN
3.0000 mL | Freq: Once | RESPIRATORY_TRACT | Status: AC
  Administered 2023-01-26: 3 mL via RESPIRATORY_TRACT

## 2023-01-26 MED ORDER — IPRATROPIUM-ALBUTEROL 0.5-2.5 (3) MG/3ML IN SOLN
RESPIRATORY_TRACT | Status: AC
  Filled 2023-01-26: qty 3

## 2023-01-26 MED ORDER — PREDNISONE 20 MG PO TABS: 40.0000 mg | ORAL_TABLET | Freq: Every day | ORAL | 0 refills | Status: AC

## 2023-01-26 MED ORDER — AZITHROMYCIN 250 MG PO TABS
ORAL_TABLET | ORAL | 0 refills | Status: DC
Start: 2023-01-26 — End: 2023-03-11

## 2023-01-26 MED ORDER — ALBUTEROL SULFATE HFA 108 (90 BASE) MCG/ACT IN AERS: 1.0000 | INHALATION_SPRAY | Freq: Four times a day (QID) | RESPIRATORY_TRACT | 0 refills | Status: DC | PRN

## 2023-01-26 NOTE — ED Provider Notes (Signed)
[facility]    CSN: [id number] Arrival date & time: 01/26/23  1632      History   Chief Complaint No chief complaint on file.   HPI Carla Hodges is a 43 y.o. female.   Patient presents with cough, chest tightness, wheezing, and chills x 1 week.  Patient denies known fever, severe shortness of breath, sharp chest pain, abdominal pain, nausea, vomiting, and diarrhea.  Denies history of asthma.     Past Medical History:  Diagnosis Date   Breast nodule 11/06/2015   Bronchitis, chronic (HCC)    Depression    moderate   Fibroids    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/27/2015   Tuberculosis 2010   not active    Patient Active Problem List   Diagnosis Date Noted   Corn 09/27/2022   Venous insufficiency 07/25/2022   Long-term use of hydroxychloroquine 05/18/2021   Liletta IUD (intrauterine device) in place since 11/25/2018 01/29/2021   Sarcoidosis of skin 07/07/2020   Bacterial vaginosis 06/27/2020   Dysmenorrhea 12/22/2019   Endometriosis determined by laparoscopy 10/02/2016   Fibroid 11/26/2011   Depression 07/28/2006   GERD 07/28/2006   Morbid obesity (HCC) 07/28/2006    Past Surgical History:  Procedure Laterality Date   HYSTEROSCOPY     2000   LAPAROSCOPY N/A 06/12/2016   Procedure: LAPAROSCOPY DIAGNOSTIC WITH PERITONEAL BIOSPSY;  Surgeon: Allie Bossier, MD;  Location: [facility];  Service: Gynecology;  Laterality: N/A;   LAPAROSCOPY N/A 06/12/2016   Procedure: LAPAROSCOPY DIAGNOSTIC, EVACUATION OF HEMATOMA;  Surgeon: Allie Bossier, MD;  Location: [facility];  Service: Gynecology;  Laterality: N/A;    OB History     Gravida  1   Para      Term      Preterm      AB  1   Living         SAB  1   IAB      Ectopic      Multiple      Live Births               Home Medications    Prior to Admission medications   Medication Sig Start Date End Date Taking? Authorizing Provider  albuterol (VENTOLIN HFA) 108 (90 Base) MCG/ACT  inhaler Inhale 1-2 puffs into the lungs every 6 (six) hours as needed for wheezing or shortness of breath. 01/26/23  Yes Susann Givens, Sinahi Knights A, NP  azithromycin (ZITHROMAX Z-PAK) 250 MG tablet Take 2 pills (500mg ) first day and one pill (250mg ) the remaining 4 days. 01/26/23  Yes Susann Givens, Dineen Conradt A, NP  predniSONE (DELTASONE) 20 MG tablet Take 2 tablets (40 mg total) by mouth daily for 5 days. 01/26/23 01/31/23 Yes Wynonia Lawman A, NP  CHORIOGONADOTROPIN ALFA Nance Inject 35 Units into the skin every Monday, Wednesday, and Friday.    [provider]  Elagolix Sodium (ORILISSA) 150 MG TABS Take 1 tablet (150 mg total) by mouth daily. 07/17/22   Warden Fillers, MD  hydroxychloroquine (PLAQUENIL) 200 MG tablet Take 200 mg by mouth 2 (two) times daily. Patient not taking: Reported on 01/18/2022 08/27/20   [provider]  ibuprofen (ADVIL) 800 MG tablet Take 1 tablet (800 mg total) by mouth 3 (three) times daily with meals as needed for headache, moderate pain or cramping. 01/29/21   Anyanwu, Jethro Bastos, MD  metroNIDAZOLE (METROGEL) 0.75 % vaginal gel Place 1 Applicatorful vaginally 2 (two) times a week. 09/26/22 03/26/23  Alicia Amel, MD  ondansetron (ZOFRAN-ODT) 4 MG disintegrating tablet Take 1 tablet (4 mg total) by mouth every 8 (eight) hours as needed. 01/21/23   Karie Mainland, Amjad, PA-C  oxyCODONE (ROXICODONE) 5 MG immediate release tablet Take 1 tablet (5 mg total) by mouth every 4 (four) hours as needed for severe pain (pain score 7-10). 01/21/23   Karie Mainland, Amjad, PA-C  urea (CARMOL) 10 % cream Apply topically as needed. 09/26/22   Alicia Amel, MD    Family History Family History  Problem Relation Age of Onset   Diabetes Maternal Uncle    Diabetes Maternal Uncle    Sarcoidosis Cousin    Alcohol abuse Neg Hx    Arthritis Neg Hx    Asthma Neg Hx    Birth defects Neg Hx    Cancer Neg Hx    COPD Neg Hx    Depression Neg Hx    Drug abuse Neg Hx    Early death Neg Hx    Hearing loss Neg  Hx    Heart disease Neg Hx    Hyperlipidemia Neg Hx    Hypertension Neg Hx    Kidney disease Neg Hx    Learning disabilities Neg Hx    Mental illness Neg Hx    Mental retardation Neg Hx    Miscarriages / Stillbirths Neg Hx    Stroke Neg Hx    Vision loss Neg Hx     Social History Social History   Tobacco Use   Smoking status: Never    Passive exposure: Never   Smokeless tobacco: Never  Substance Use Topics   Alcohol use: No   Drug use: Yes    Types: Marijuana     Allergies   Latex   Review of Systems Review of Systems  Constitutional:  Positive for chills and fatigue.  HENT:  Positive for congestion.   Respiratory:  Positive for chest tightness, shortness of breath and wheezing.   Cardiovascular:  Negative for chest pain.  Gastrointestinal:  Negative for abdominal pain, diarrhea, nausea and vomiting.     Physical Exam Triage Vital Signs ED Triage Vitals  Encounter Vitals Group     BP 01/26/23 1734 (!) 146/85     Systolic BP Percentile --      Diastolic BP Percentile --      Pulse Rate 01/26/23 1734 84     Resp 01/26/23 1734 20     Temp 01/26/23 1734 99.7 F (37.6 C)     Temp Source 01/26/23 1734 Oral     SpO2 01/26/23 1734 92 %     Weight --      Height --      Head Circumference --      Peak Flow --      Pain Score 01/26/23 1735 10     Pain Loc --      Pain Education --      Exclude from Growth Chart --    No data found.  Updated Vital Signs BP (!) 146/85 (BP Location: Left Wrist)   Pulse 84   Temp 99.7 F (37.6 C) (Oral)   Resp 20   LMP 01/19/2023   SpO2 92%   Visual Acuity Right Eye Distance:   Left Eye Distance:   Bilateral Distance:    Right Eye Near:   Left Eye Near:    Bilateral Near:     Physical Exam Vitals and nursing note reviewed.  Constitutional:      General: She  is awake. She is not in acute distress.    Appearance: Normal appearance. She is well-developed and well-groomed. She is not ill-appearing.  HENT:      Right Ear: Tympanic membrane, ear canal and external ear normal.     Left Ear: Tympanic membrane, ear canal and external ear normal.     Nose: Congestion and rhinorrhea present.     Mouth/Throat:     Mouth: Mucous membranes are moist.     Pharynx: Posterior oropharyngeal erythema present. No oropharyngeal exudate.  Cardiovascular:     Rate and Rhythm: Normal rate and regular rhythm.  Pulmonary:     Effort: Pulmonary effort is normal.     Breath sounds: Decreased breath sounds and wheezing present.  Skin:    General: Skin is warm and dry.  Neurological:     Mental Status: She is alert.  Psychiatric:        Behavior: Behavior is cooperative.      UC Treatments / Results  Labs (all labs ordered are listed, but only abnormal results are displayed) Labs Reviewed - No data to display  EKG   Radiology DG Chest 2 View Result Date: 01/26/2023 CLINICAL DATA:  Cough, wheezing, chest discomfort EXAM: CHEST - 2 VIEW COMPARISON:  06/21/2016 FINDINGS: Heart and mediastinal contours are within normal limits. No focal opacities or effusions. No acute bony abnormality. IMPRESSION: No active cardiopulmonary disease. Electronically Signed   By: Charlett Nose M.D.   On: 01/26/2023 18:38    Procedures Procedures (including critical care time)  Medications Ordered in UC Medications  ipratropium-albuterol (DUONEB) 0.5-2.5 (3) MG/3ML nebulizer solution 3 mL (3 mLs Nebulization Given 01/26/23 1839)    Initial Impression / Assessment and Plan / UC Course  I have reviewed the triage vital signs and the nursing notes.  Pertinent labs & imaging results that were available during my care of the patient were reviewed by me and considered in my medical decision making (see chart for details).     Patient presented with 1 week history of cough, chest tightness, wheezing, and chills.  Denies history of asthma.  Upon assessment congestion and rhinorrhea are present.  Mildly decreased breath sounds and  wheezing present throughout all lung fields.  Given DuoNeb with relief of wheezing.  Chest x-ray revealed no active cardiopulmonary disease.  Prescribed azithromycin and prednisone for continued upper respiratory infection.  Prescribed albuterol inhaler as needed for chest tightness and shortness of breath.  Discussed follow-up, return, strict ER precautions. Final Clinical Impressions(s) / UC Diagnoses   Final diagnoses:  Acute cough  Acute upper respiratory infection     Discharge Instructions      Start taking azithromycin and prednisone as prescribed.  You can use inhaler every 6 hours as needed for chest tightness, shortness of breath, and wheezing.  Keep taking Mucinex to help with cough and congestion.  Alternate between Tylenol and ibuprofen as needed for pain and fever.  Return here as needed.  If you develop severe shortness of breath, chest pain, or fevers unrelieved by medication please seek immediate medical treatment in the ER.    ED Prescriptions     Medication Sig Dispense Auth. Provider   albuterol (VENTOLIN HFA) 108 (90 Base) MCG/ACT inhaler Inhale 1-2 puffs into the lungs every 6 (six) hours as needed for wheezing or shortness of breath. 6.7 g Wynonia Lawman A, NP   azithromycin (ZITHROMAX Z-PAK) 250 MG tablet Take 2 pills (500mg ) first day and one pill (250mg ) the remaining 4  days. 6 tablet Wynonia Lawman A, NP   predniSONE (DELTASONE) 20 MG tablet Take 2 tablets (40 mg total) by mouth daily for 5 days. 10 tablet Wynonia Lawman A, NP      PDMP not reviewed this encounter.   Wynonia Lawman A, NP 01/26/23 1920

## 2023-01-26 NOTE — ED Triage Notes (Signed)
Pt reports  she has been wheezing, coughing, chest discomfort, chills x 1 day

## 2023-01-26 NOTE — Discharge Instructions (Signed)
Start taking azithromycin and prednisone as prescribed.  You can use inhaler every 6 hours as needed for chest tightness, shortness of breath, and wheezing.  Keep taking Mucinex to help with cough and congestion.  Alternate between Tylenol and ibuprofen as needed for pain and fever.  Return here as needed.  If you develop severe shortness of breath, chest pain, or fevers unrelieved by medication please seek immediate medical treatment in the ER.

## 2023-02-13 ENCOUNTER — Ambulatory Visit: Payer: BLUE CROSS/BLUE SHIELD | Admitting: Student

## 2023-02-13 VITALS — BP 121/83 | HR 78 | Ht 65.0 in | Wt 341.8 lb

## 2023-02-13 DIAGNOSIS — J4521 Mild intermittent asthma with (acute) exacerbation: Secondary | ICD-10-CM

## 2023-02-13 DIAGNOSIS — R062 Wheezing: Secondary | ICD-10-CM

## 2023-02-13 MED ORDER — PREDNISONE 20 MG PO TABS: 40.0000 mg | ORAL_TABLET | Freq: Every day | ORAL | 0 refills | Status: AC

## 2023-02-13 MED ORDER — ALBUTEROL SULFATE HFA 108 (90 BASE) MCG/ACT IN AERS: 1.0000 | INHALATION_SPRAY | Freq: Four times a day (QID) | RESPIRATORY_TRACT | 0 refills | Status: DC | PRN

## 2023-02-13 MED ORDER — QVAR REDIHALER 40 MCG/ACT IN AERB: 2.0000 | INHALATION_SPRAY | Freq: Two times a day (BID) | RESPIRATORY_TRACT | 12 refills | Status: DC

## 2023-02-13 MED ORDER — DOXYCYCLINE HYCLATE 100 MG PO TABS: 100.0000 mg | ORAL_TABLET | Freq: Two times a day (BID) | ORAL | 0 refills | Status: AC

## 2023-02-13 NOTE — Patient Instructions (Signed)
 It was great to see you! Thank you for allowing me to participate in your care!   I recommend that you always bring your medications to each appointment as this makes it easy to ensure we are on the correct medications and helps us  not miss when refills are needed.  Our plans for today:  -Take 2 puffs of Qvar  inhaler twice daily every day -4 puffs of albuterol  every 4 hours as needed -Take 40 mg of prednisone  daily for 5 days -Take 100 mg of doxycycline  twice daily for 5 days -You will receive a call to schedule pulmonary function testing -Follow-up if symptoms fail to improve over the next 1 to 2 weeks  Take care and seek immediate care sooner if you develop any concerns. Please remember to show up 15 minutes before your scheduled appointment time!  Gladis Church, DO [facility]

## 2023-02-13 NOTE — Progress Notes (Signed)
    SUBJECTIVE:   CHIEF COMPLAINT / HPI:   Wheezing Patient has wheezing and cough since mid January.  Seen in urgent care on 01/26/2023 received prednisone  burst and azithromycin  which significantly improved symptoms.  Unfortunately, symptoms have returned over the past couple weeks.  She has a history of recurrent bronchitis in childhood.  Denies fevers, chills, nausea vomiting diarrhea.  She has a very distant smoking history of greater than 15 years, and did not smoke cigarettes but instead intermittent marijuana.  Not currently smoking.  Additionally, she has diagnosis of sarcoidosis of skin-however most recent chest x-ray is without evidence of granulomas.  PERTINENT  PMH: Sarcoidosis skin  OBJECTIVE:   BP 121/83   Pulse 78   Ht 5' 5 (1.651 m)   Wt (!) 341 lb 12.8 oz (155 kg)   LMP 01/19/2023   SpO2 97%   BMI 56.88 kg/m    General: NAD, pleasant Cardio: RRR, no MRG. Cap Refill <2s. Respiratory: Diffuse expiratory wheezing and prolonged expiratory phase bilaterally, no focal findings, normal wob on RA Skin: Warm and dry  ASSESSMENT/PLAN:   Assessment & Plan Wheezing Question underlying asthma versus COPD.  X-ray in January WNL, despite sarcoidosis can very low concern for sarcoidosis of lungs at this time (however symptoms keep recurring and seem to improve with prednisone  alone, consider on differential). Given wheezing on exam, will treat as asthma exacerbation.  Given recent antibiotic use and cough, will cover with doxycycline  for possible exacerbation versus secondary infection.  Patient using albuterol  inhaler every 4 hours, will also prescribe daily maintenance inhaler while we wait for PFTs.  If PFTs are not suggestive of asthma or COPD, will stop inhalers.  Inhaler regimen guided by insurance coverage. - Qvar  40 mcg, 2 puffs twice daily - Ventolin  4 puffs every 4 hours as needed - Referral for PFTs with our pharmacy clinic - Doxycycline  100 mg twice daily for 5  days - Prednisone  40 mg once daily for 5 days  Gladis Church, DO [facility]

## 2023-02-14 ENCOUNTER — Encounter: Payer: Self-pay | Admitting: Student

## 2023-02-14 ENCOUNTER — Ambulatory Visit (INDEPENDENT_AMBULATORY_CARE_PROVIDER_SITE_OTHER): Payer: BLUE CROSS/BLUE SHIELD | Admitting: Family Medicine

## 2023-02-14 ENCOUNTER — Ambulatory Visit (INDEPENDENT_AMBULATORY_CARE_PROVIDER_SITE_OTHER): Payer: BLUE CROSS/BLUE SHIELD | Admitting: Student

## 2023-02-14 VITALS — BP 121/87 | HR 76 | Ht 65.0 in | Wt 341.8 lb

## 2023-02-14 DIAGNOSIS — R062 Wheezing: Secondary | ICD-10-CM | POA: Diagnosis not present

## 2023-02-14 DIAGNOSIS — H6123 Impacted cerumen, bilateral: Secondary | ICD-10-CM

## 2023-02-14 DIAGNOSIS — S00412A Abrasion of left ear, initial encounter: Secondary | ICD-10-CM

## 2023-02-14 NOTE — Patient Instructions (Signed)
 It was wonderful to see you today.  Please bring ALL of your medications with you to every visit.   Today we talked about:   Please do NOT put anything (including Qtips) in your ear over the weekend   Follow up on Monday    Thank you for choosing [facility].   Please call [phone] with any questions about today's appointment.  Please be sure to schedule follow up at the front  desk before you leave today.   Suzann Daring, MD  Family Medicine

## 2023-02-14 NOTE — Progress Notes (Signed)
    SUBJECTIVE:   CHIEF COMPLAINT: bleeding L ear  HPI:   Carla Hodges is a 44 y.o.  presenting after ear cleansing with bright red blood from the left EAC. She had her ears cleaned out earlier today in our clinic. While driving home L ear started bleeding. No pain. Has some muffled hearing on L. On tissue paper several small spots of bright red blood.   Exam Talkative, appropriate HEENT R EAC with hard cerumen small amount of bright red blood, TM visualized, appears intact L EAC with cerumen packed up against L EAC, small abrasion at 3 to 6 oclock on posterior EAC    Assessment & Plan Abrasion of left ear, initial encounter No continued bleeding on exam Recommended no Qtips over weekend, reviewed return precautions Follow up Monday--may need ENT referral if still change in hearing (suspect due to cerumen) or abrasion not healing Scheduled follow up All questions answered    Suzann Daring, MD  Family Medicine Teaching Service  [facility]

## 2023-02-14 NOTE — Patient Instructions (Addendum)
 It was great to see you! Thank you for allowing me to participate in your care!   I recommend that you always bring your medications to each appointment as this makes it easy to ensure we are on the correct medications and helps us  not miss when refills are needed.  Our plans for today:  -Please continue taking your Qvar , Ventolin , doxycycline  and prednisone  as prescribed. -Please go to the emergency department if you experiencing the following: Severe shortness of breath does not improve with albuterol  inhaler, severe chest pain does not resolve with rest, vomiting that does not stop and he cannot keep liquids down. -You will receive a call from our pharmacy team to schedule pulmonary function testing for asthma   Take care and seek immediate care sooner if you develop any concerns. Please remember to show up 15 minutes before your scheduled appointment time!  Gladis Church, DO [facility]

## 2023-02-14 NOTE — Progress Notes (Signed)
    SUBJECTIVE:   CHIEF COMPLAINT / HPI:   Wheezing Patient was just in our clinic yesterday, by me for wheezing.  Started on prednisone  course, doxycycline  course and started on maintenance inhalers for possible asthma.  This morning she woke up short of breath and called EMS.  When EMS arrived her vitals were stable.  She took her prednisone  and albuterol  inhaler and felt better.  She did not go to the ED.  Currently she feels improved from this morning, but wanted to be checked.  Remains afebrile, no new symptoms.  Ear fullness Patient has bilateral impacted TMs. Reports muffled hearing. Requesting irrigation today.   OBJECTIVE:   BP 121/87   Pulse 76   Ht 5' 5 (1.651 m)   Wt (!) 341 lb 12.8 oz (155 kg)   LMP 01/19/2023   SpO2 98%   BMI 56.88 kg/m    General: NAD, pleasant Ears bilaterally: Impacted TM bilaterally.  Post irrigation, earwax still impacted.  After discussion, patient was agreeable for curettage.  Used curettage for left and right canal, using otoscope as guide.  Removed wax.  On recheck TM still impacted bilaterally, however cerumen burden decreased.  Canal was clear and without trauma on revisualization. Cardio: RRR, no MRG. Cap Refill <2s. Respiratory: Mild expiratory wheezing and RLL, clear to auscultation elsewhere (mention previously).  Normal wob on RA GI: Abdomen is soft, not tender, not distended. BS present Skin: Warm and dry  ASSESSMENT/PLAN:   Assessment & Plan Wheezing Overall, improved exam from yesterday.  Still suspect asthma exacerbation with possible superimposed secondary infection.  Given improvement, defer chest x-ray. - Continue doxycycline  and prednisone  course as prescribed - Continue Qvar  and albuterol  inhaler as prescribed - Pending follow-up with Dr. Koval for PFTs - Follow-up if symptoms fail to improve after course of doxycycline  and prednisone  Impacted cerumen, bilateral S/p irrigation and curettage.  Patient reported increased  muffled sound on left ear after irrigation, which improved after curettage.  Visualized canals bilaterally after procedure, no bleeding nor abrasions present.  TMs minimally visible, hard cerumen still present. - OTC Debrox - Follow-up with not improved  Gladis Church, DO 02/14/23

## 2023-02-17 ENCOUNTER — Ambulatory Visit (INDEPENDENT_AMBULATORY_CARE_PROVIDER_SITE_OTHER): Payer: Self-pay | Admitting: Student

## 2023-02-17 ENCOUNTER — Encounter: Payer: Self-pay | Admitting: Student

## 2023-02-17 VITALS — BP 122/76 | HR 94 | Ht 65.0 in | Wt 340.0 lb

## 2023-02-17 DIAGNOSIS — N921 Excessive and frequent menstruation with irregular cycle: Secondary | ICD-10-CM

## 2023-02-17 DIAGNOSIS — N809 Endometriosis, unspecified: Secondary | ICD-10-CM

## 2023-02-17 DIAGNOSIS — G8929 Other chronic pain: Secondary | ICD-10-CM

## 2023-02-17 DIAGNOSIS — K625 Hemorrhage of anus and rectum: Secondary | ICD-10-CM

## 2023-02-17 DIAGNOSIS — R1084 Generalized abdominal pain: Secondary | ICD-10-CM

## 2023-02-17 DIAGNOSIS — Z1211 Encounter for screening for malignant neoplasm of colon: Secondary | ICD-10-CM | POA: Diagnosis not present

## 2023-02-17 DIAGNOSIS — H6123 Impacted cerumen, bilateral: Secondary | ICD-10-CM

## 2023-02-17 MED ORDER — IBUPROFEN 800 MG PO TABS: 800.0000 mg | ORAL_TABLET | Freq: Three times a day (TID) | ORAL | 3 refills | Status: DC | PRN

## 2023-02-17 MED ORDER — NEOMYCIN-POLYMYXIN-HC 3.5-10000-1 OT SOLN: 3.0000 [drp] | Freq: Four times a day (QID) | OTIC | 0 refills | Status: DC

## 2023-02-17 NOTE — Patient Instructions (Addendum)
 Deena,  I am sending some ear drops to help with the hard wax you've got in there. If this does not help to flush it, we may need to send you across the street to ENT for them to fish it out.   I have ordered a colonoscopy.  The GI doctors will call you to get this scheduled.  Alexa Andrews, MD

## 2023-02-17 NOTE — Progress Notes (Signed)
    SUBJECTIVE:   CHIEF COMPLAINT / HPI:   Cerumen impaction  Seen by Dr. Claudean Severance on Friday of last week for cerumen impaction with sense of fullness and diminished hearing. Curettage performed with incomplete evacuation of cerumen and abrasion to the canal so she is here today to follow-up on this.  Has not placed anything in the ear since that time.  BRBPR Intermittent bright red blood spotting on toilet paper for several months now.  Denies any known history of hemorrhoids.  She also does have some chronic abdominal pain that has previously been chalked up to endometriosis.  We tried to tease out whether there was any association between her menses and her noticing the bright red blood on the paper but she cannot really say. She denies any family history of colon cancer.   OBJECTIVE:  Chaperone Dallie Dad present for sensitive portions of exam BP 122/76   Pulse 94   Ht 5\' 5"  (1.651 m)   Wt (!) 340 lb (154.2 kg)   LMP 01/19/2023   SpO2 97%   BMI 56.58 kg/m   Gen: Well appearing and NAD  HENT: L ear with cerumen impaction, the abrasion at the 3 oclock position seems to be healing well. Abrasion at the 6 oclock position has healed. Unable to visualize the TM.  Abd: Non-tender and non-distended  Rectal: Anus without evidence of fissure or external hemorrhoid. Internal exam deferred for patient comfort. No evidence of bright or dried blood.   ASSESSMENT/PLAN:   Assessment & Plan Impacted cerumen, bilateral Abrasions seem to be healing well.  Remains with heavy cerumen load.  Will treat this with Cortisporin drops to help soften some of the hardened cerumen.  She will follow-up in about 2 weeks and if cerumen remains impacted, will need referral to ENT for cleanout under microscopy. -Cortisporin drops 4 times daily Bright red blood per rectum By report it sounds like a minimal volume of blood loss but certainly warrants further investigation especially given her concomitant  chronic abdominal pain and endometriosis.  Will ultimately need a colonoscopy to evaluate for malignancy versus colonic endometriosis. -Referral placed to gastroenterology    J Dorothyann Gibbs, MD [facility]

## 2023-02-20 ENCOUNTER — Encounter: Payer: Self-pay | Admitting: Student

## 2023-02-21 ENCOUNTER — Ambulatory Visit: Payer: BLUE CROSS/BLUE SHIELD | Admitting: Obstetrics & Gynecology

## 2023-02-27 ENCOUNTER — Ambulatory Visit: Payer: BLUE CROSS/BLUE SHIELD | Admitting: Pharmacist

## 2023-03-03 ENCOUNTER — Encounter: Payer: Self-pay | Admitting: Obstetrics and Gynecology

## 2023-03-03 ENCOUNTER — Other Ambulatory Visit: Payer: Self-pay

## 2023-03-03 ENCOUNTER — Ambulatory Visit: Payer: BLUE CROSS/BLUE SHIELD | Admitting: Obstetrics and Gynecology

## 2023-03-03 VITALS — BP 128/81 | HR 78 | Ht 65.0 in | Wt 334.1 lb

## 2023-03-03 DIAGNOSIS — T8332XA Displacement of intrauterine contraceptive device, initial encounter: Secondary | ICD-10-CM

## 2023-03-03 NOTE — Progress Notes (Unsigned)
 ED visit on 01/21/23 :  Malpositioned intrauterine device (IUD), initial encounter    CT abdomen pelvis obtained. Shows chronically malpositioned IUD. It is in the endometrium and perforated the serosa. Discussed with gynecology. They recommend they would like to see patient in clinic and they will remove the IUD. Nothing that needs to be done tonight.

## 2023-03-05 NOTE — Progress Notes (Signed)
   Subjective:    Patient ID: Carla Hodges, female    DOB: Aug 20, 1980, 43 y.o.   MRN: [id number]  HPI 43 yo patient seen as ER follow up from 01/2023.  Pt initially seen for abdominal pain.  During eval CT scan suggested possible uterine perforation with IUD.  Pt recently discontinued [name] as she had reached the 2 year mark with the medication.  At this point, pain had not returned and neither had the uterine bleeding.  Pt recently had an insurance change and [facility] is now out of network.  IUD could not be removed at this visit.   Review of Systems     Objective:   Physical Exam Vitals:   03/03/23 1426  BP: 128/81  Pulse: 78         Assessment & Plan:   1. IUD migration, initial encounter (Primary) Chart reviewed with CT scan suggesting possible uterine perforation.  Pt decided to wait on removal until insurance issues could be worked out.  I spent 10 minutes dedicated to the care of this patient including previsit review of records, face to face time with the patient discussing radiology findings and post visit testing.       Warden Fillers, MD Faculty Attending, [facility]

## 2023-03-06 ENCOUNTER — Ambulatory Visit (INDEPENDENT_AMBULATORY_CARE_PROVIDER_SITE_OTHER): Payer: BLUE CROSS/BLUE SHIELD | Admitting: Student

## 2023-03-06 ENCOUNTER — Encounter: Payer: Self-pay | Admitting: Student

## 2023-03-06 VITALS — BP 119/80 | HR 72 | Ht 65.0 in | Wt 333.4 lb

## 2023-03-06 DIAGNOSIS — H6123 Impacted cerumen, bilateral: Secondary | ICD-10-CM | POA: Diagnosis not present

## 2023-03-07 NOTE — Progress Notes (Signed)
    SUBJECTIVE:   CHIEF COMPLAINT / HPI:   Cerumen Impaction Follow-up on cerumen impaction.  Last visit I gave her some Cortisporin drops to help soften the wax. She remains with a sense of fullness and diminished hearing on that side. Has been using the drops.   OBJECTIVE:   BP 119/80   Pulse 72   Ht 5\' 5"  (1.651 m)   Wt (!) 333 lb 6.4 oz (151.2 kg)   SpO2 100%   BMI 55.48 kg/m   Gen: Well appearing and NAD  HENT: L TM incompletely visualized 2/2 wax burden, but the visualized portions appear healthy. R TM non visualized 2/2 wax. Canal without lesion or excoriation.   After curettage, the TM is partially visualized and appears healthy.   ASSESSMENT/PLAN:   Assessment & Plan Impacted cerumen, bilateral Right ear seems to be resolving.  The left ear however, remains impacted.  Cerumen was disimpacted using disposable ear curette.  She felt better after curettage.  TM appears intact on repeat exam. -Follow-up as needed   J Dorothyann Gibbs, MD [facility]

## 2023-03-11 ENCOUNTER — Ambulatory Visit (INDEPENDENT_AMBULATORY_CARE_PROVIDER_SITE_OTHER): Payer: BLUE CROSS/BLUE SHIELD | Admitting: Pharmacist

## 2023-03-11 ENCOUNTER — Encounter: Payer: Self-pay | Admitting: Pharmacist

## 2023-03-11 VITALS — BP 125/84 | HR 56 | Ht 68.0 in | Wt 336.0 lb

## 2023-03-11 DIAGNOSIS — R0602 Shortness of breath: Secondary | ICD-10-CM | POA: Diagnosis not present

## 2023-03-11 NOTE — Assessment & Plan Note (Signed)
 Patient has been experiencing shortness of breath for post-respiratory infection and taking albuterol as needed. Medication adherence sufficient.  Spirometry evaluation reveals diminished lung function with reversibility post respiratory illness. Post nebulized albuterol tx revealed 10-15% improvement in FEV1 and FVC.   - Continue Albuterol 2 puffs PRN symptoms AND prior to planned exertion.  - Instructed to initiate use of QVAR 2 puffs twice daily to provide anti-inflammatory effect in lungs.  -Educated patient on purpose, proper use, potential adverse effects including risk of esophageal candidiasis and need to rinse mouth after each use.  -Patient was able to demonstrate improved inhalation technique following education.

## 2023-03-11 NOTE — Patient Instructions (Signed)
 It was nice to see you today!  Medication Changes: START QVAR inhaler 2 puffs twice daily  Continue using albuterol as needed upon exertion  Continue all other medication the same.   Keep up the good work with diet and exercise. Aim for a diet full of vegetables, fruit and lean meats (chicken, [place], fish). Try to limit salt intake by eating fresh or frozen vegetables (instead of canned), rinse canned vegetables prior to cooking and do not add any additional salt to meals.

## 2023-03-11 NOTE — Progress Notes (Signed)
   S:     Chief Complaint  Patient presents with   Medication Management    Shortness of Breath   43 y.o. female who presents for respiratory evaluation, education, and management. Patient arrives in good spirits and presents without any assistance.  Patient was referred and last seen by Primary Care Provider, Dr. Marisue Humble, on 02/17/23.   PMH is significant for GERD.  At last visit, pt reported shortness of breath due to recent respiratory infection.   Patient reports breathing has been symptomatic since recent respiratory infection in January. Currently experiences shortness of breath after climbing one flight of stairs.   Patient reports adherence to medications Patient reports last dose of reliever medications was 3/3 (yesterday) Current asthma medications: albuterol 2 puffs prn    O: Review of Systems  Constitutional:  Positive for malaise/fatigue.  Respiratory:  Positive for cough and shortness of breath.   All other systems reviewed and are negative.   Physical Exam Constitutional:      Appearance: Normal appearance.  Pulmonary:     Effort: Pulmonary effort is normal.  Neurological:     Mental Status: She is alert.  Psychiatric:        Mood and Affect: Mood normal.        Thought Content: Thought content normal.     Vitals:   03/11/23 1633 03/11/23 1634  BP: (!) 133/94 125/84  Pulse: (!) 56   SpO2: 100%     See "scanned report" or Documentation Flowsheet (discrete results - PFTs) for  Spirometry results. Patient provided good effort while attempting spirometry.  Lung Age =   Albuterol Neb  Lot# [id number]     Exp. 10/2023  A/P: Patient has been experiencing shortness of breath for post-respiratory infection and taking albuterol as needed. Medication adherence sufficient.  Spirometry evaluation reveals diminished lung function with reversibility post respiratory illness. Post nebulized albuterol tx revealed 10-15% improvement in FEV1 and FVC.   - Continue  Albuterol 2 puffs PRN symptoms AND prior to planned exertion.  - Instructed to initiate use of QVAR 2 puffs twice daily to provide anti-inflammatory effect in lungs.  -Educated patient on purpose, proper use, potential adverse effects including risk of esophageal candidiasis and need to rinse mouth after each use.  -Patient was able to demonstrate improved inhalation technique following education.   Reviewed results of pulmonary function tests.  Patient verbalized understanding of treatment plan.  Written patient instructions provided.  Total time in face to face counseling 38 minutes.    Follow-up:  Pharmacist None planned. PCP clinic visit 3/11 Patient seen with Threasa Heads, PharmD Candidate and Mack Guise, PharmD Candidate.

## 2023-03-12 NOTE — Progress Notes (Signed)
 Reviewed and agree with Dr Macky Lower plan.

## 2023-03-18 ENCOUNTER — Encounter: Payer: Self-pay | Admitting: Student

## 2023-03-18 ENCOUNTER — Ambulatory Visit: Payer: BLUE CROSS/BLUE SHIELD | Admitting: Student

## 2023-03-18 ENCOUNTER — Other Ambulatory Visit: Payer: Self-pay | Admitting: Student

## 2023-03-18 ENCOUNTER — Ambulatory Visit (INDEPENDENT_AMBULATORY_CARE_PROVIDER_SITE_OTHER): Admitting: Student

## 2023-03-18 VITALS — BP 127/86 | HR 69 | Ht 65.5 in | Wt 335.4 lb

## 2023-03-18 DIAGNOSIS — J4521 Mild intermittent asthma with (acute) exacerbation: Secondary | ICD-10-CM

## 2023-03-18 DIAGNOSIS — H6123 Impacted cerumen, bilateral: Secondary | ICD-10-CM

## 2023-03-18 MED ORDER — DEBROX 6.5 % OT SOLN: 5.0000 [drp] | Freq: Two times a day (BID) | OTIC | 0 refills | Status: AC

## 2023-03-18 NOTE — Progress Notes (Deleted)
    SUBJECTIVE:   CHIEF COMPLAINT / HPI:   ?Asthma   PERTINENT  PMH / PSH: ***  OBJECTIVE:   There were no vitals taken for this visit.  ***  ASSESSMENT/PLAN:   No problem-specific Assessment & Plan notes found for this encounter.     Eliezer Mccoy, MD [facility]

## 2023-03-18 NOTE — Patient Instructions (Signed)
 Pleasure to meet you today.  You still have some earwax in your ear canal.  I recommend using the eardrops for at least another week.  If you follow-up and still have significant earwax we will consider irrigation of your ear.  And if still unable to get the wax out we will send you to an ENT specialist.

## 2023-03-18 NOTE — Progress Notes (Unsigned)
    SUBJECTIVE:   CHIEF COMPLAINT / HPI:   43 year old female with history of cerumen impaction presenting today for follow-up.  Was recently seen few weeks ago and sent home with prescription of Debrox for cerumen impaction.  Patient states she only used it for few days but did not complete the entire recommended duration for application.   PERTINENT  PMH / PSH: ***  OBJECTIVE:   BP 127/86   Pulse 69   Ht 5' 5.5" (1.664 m)   Wt (!) 335 lb 6.4 oz (152.1 kg)   SpO2 100%   BMI 54.96 kg/m    Physical Exam General: Alert, well appearing, NAD,  Ear: Bilateral cerumen impacted, unable to visualize TM Cardiovascular: RRR, No Murmurs, Normal S2/S2 Respiratory: CTAB, No wheezing or Rales   ASSESSMENT/PLAN:   No problem-specific Assessment & Plan notes found for this encounter.   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 Reports coughing and dyspnea. On exam well appearing/ ill appearing found to have Inspiratory and expiratory wheezing. History and exam is consistent with asthma exercabtion. Discussed management with mom/patient -Continue/ Rx daily controller inhaler (Flovent 2 puffs BID) -Use 2-4 puff of Albuterol every 4-6 hours for 24hr and PRN afterwards, return if no improvement -Rx patient on solu-medrol for 5 days or Orapred*** -Discussed return precaution   Continue albuterol 6 puffs with spacer every 4 hours for the next 2 days. Then use as needed for cough, wheezing, or shortness of breath.   Use the Flovent inhaler 2 puffs with spacer twice daily, every day. Continue this (even when feeling well) . Be sure to rinse your mouth out or brush your teach after using this inhaler.   COPD Inhalers: SABA: Albuterol 2 puff Q4H, xoperax (no HTN, Tachy, Tremor) SAMA:Atrovent SABA/SAMA: Duoneb LAMA: Incruse Ellipta, Spiriva (Daily control) LABA + LAMA: Anoro Ellipta (Daily control) ICS/LABA/LABA: Trelegy Ellipta   Asthma SABA:  Albuterol 2 puff Q4H, xoperax (no HTN, Tachy, Tremor) SABA/SAMA: Duoneb ICS: Flovent or pumicort (daily control) ICS/LABA: Symbicort, Dulera (daily control)   Jerre Simon, MD [facility]   {    This will disappear when note is signed, click to select method of visit    :1}

## 2023-04-01 ENCOUNTER — Ambulatory Visit: Admitting: Student

## 2023-04-02 ENCOUNTER — Encounter: Payer: Self-pay | Admitting: Student

## 2023-04-07 ENCOUNTER — Ambulatory Visit (INDEPENDENT_AMBULATORY_CARE_PROVIDER_SITE_OTHER): Admitting: Student

## 2023-04-07 ENCOUNTER — Encounter: Payer: Self-pay | Admitting: Student

## 2023-04-07 VITALS — BP 116/74 | HR 64 | Ht 65.0 in | Wt 345.0 lb

## 2023-04-07 DIAGNOSIS — H6123 Impacted cerumen, bilateral: Secondary | ICD-10-CM | POA: Diagnosis not present

## 2023-04-07 DIAGNOSIS — N809 Endometriosis, unspecified: Secondary | ICD-10-CM | POA: Diagnosis not present

## 2023-04-07 MED ORDER — IBUPROFEN 800 MG PO TABS: 800.0000 mg | ORAL_TABLET | Freq: Three times a day (TID) | ORAL | 3 refills | Status: DC | PRN

## 2023-04-07 NOTE — Patient Instructions (Signed)
 Pleasure to see you today.  Today we irrigated your left ear and has significant amount of cerumen removed.  On exam was able to see your left eardrum which was normal.  You still have cerumen impaction on your right ear and will likely need to return for irrigation of your right ear.

## 2023-04-07 NOTE — Progress Notes (Signed)
    SUBJECTIVE:   CHIEF COMPLAINT / HPI:   43 year old female with a history of recurrent ear wax impaction, presents for cerumen impaction follow-up.  At last visit sent home with Debrox eardrop which patient said provided little to no relief.  Endorses diminished hearing and intermittent ringing in the left ear. She expresses interest in self-management of the ear wax impaction, inquiring about purchasing an ear cleaning tool online.  PERTINENT  PMH / PSH: Reviewed  OBJECTIVE:   BP 116/74 (BP Location: Left Arm, Patient Position: Sitting, Cuff Size: Large)   Pulse 64   Ht 5\' 5"  (1.651 m)   Wt (!) 345 lb (156.5 kg)   SpO2 99%   BMI 57.41 kg/m    Physical Exam General: Alert, well appearing, NAD Left VWU:[id number] eardrum visible after cerumen removed. Right ear: Unable to visualize TM due to cerumen impaction. Cardiovascular: RRR, well perfused   ASSESSMENT/PLAN:   Bilateral impacted cerumen Cerumen impaction in the left ear was successfully treated with irrigation, removing six large pieces of cerumen. Patient endorsed improved hearing, and resolving of prior ringing. She reports no current ear pain or tinnitus. The right ear remains untreated, and she is considering future treatment. - Continue Brox ear drops as previously instructed. - Schedule follow-up in a few weeks for potential right ear irrigation. -Advised against at-home ear cleaning tools due to risks of further impaction or eardrum damage   Jerre Simon, MD [facility]

## 2023-04-07 NOTE — Assessment & Plan Note (Signed)
 Cerumen impaction in the left ear was successfully treated with irrigation, removing six large pieces of cerumen. Patient endorsed improved hearing, and resolving of prior ringing. She reports no current ear pain or tinnitus. The right ear remains untreated, and she is considering future treatment. - Continue Brox ear drops as previously instructed. - Schedule follow-up in a few weeks for potential right ear irrigation. -Advised against at-home ear cleaning tools due to risks of further impaction or eardrum damage

## 2023-04-21 ENCOUNTER — Encounter: Payer: Self-pay | Admitting: Student

## 2023-04-21 ENCOUNTER — Ambulatory Visit (INDEPENDENT_AMBULATORY_CARE_PROVIDER_SITE_OTHER): Admitting: Student

## 2023-04-21 VITALS — BP 125/88 | HR 67 | Ht 65.0 in | Wt 340.6 lb

## 2023-04-21 DIAGNOSIS — H6123 Impacted cerumen, bilateral: Secondary | ICD-10-CM | POA: Diagnosis not present

## 2023-04-21 NOTE — Progress Notes (Signed)
    SUBJECTIVE:   CHIEF COMPLAINT / HPI:   43 year old female presenting today for irrigation of the right ear for cerumen impaction.  She has had history of cerumen impaction and failed trial with Debrox eardrops.  Denies any ear pain or difficulty hearing.  PERTINENT  PMH / PSH: Reviewed  OBJECTIVE:   BP 125/88   Pulse 67   Ht 5\' 5"  (1.651 m)   Wt (!) 340 lb 9.6 oz (154.5 kg)   SpO2 98%   BMI 56.68 kg/m    Physical Exam General: Alert, well appearing, morbidly obese Ears: Visualized normal left TM and unable to visualize right TM due to cerumen impaction Cardiovascular: Regular rate and rhythm, well-perfused Respiratory: Normal work of breathing on RA  ASSESSMENT/PLAN:   Bilateral impacted cerumen Right ear cerumen impaction was successfully treated with irrigation.  Denies any ear pain or tinnitus.  Post treatment exam showed normal TM bilaterally.    Goble Last, MD [facility]

## 2023-04-21 NOTE — Patient Instructions (Signed)
 Pleasure to see you today.  Today we irrigated your ear and removed particles from your right ear.  The and was able to visualize your tympanic membrane and the ear cleared.

## 2023-04-21 NOTE — Assessment & Plan Note (Signed)
 Right ear cerumen impaction was successfully treated with irrigation.  Denies any ear pain or tinnitus.  Post treatment exam showed normal TM bilaterally.

## 2023-06-09 ENCOUNTER — Other Ambulatory Visit: Payer: Self-pay | Admitting: Student

## 2023-06-09 DIAGNOSIS — J4521 Mild intermittent asthma with (acute) exacerbation: Secondary | ICD-10-CM

## 2023-06-17 ENCOUNTER — Encounter: Payer: Self-pay | Admitting: *Deleted

## 2023-06-24 ENCOUNTER — Ambulatory Visit (INDEPENDENT_AMBULATORY_CARE_PROVIDER_SITE_OTHER): Admitting: Student

## 2023-06-24 ENCOUNTER — Encounter: Payer: Self-pay | Admitting: Family Medicine

## 2023-06-24 VITALS — BP 118/68 | HR 72 | Ht 65.5 in | Wt 338.6 lb

## 2023-06-24 DIAGNOSIS — D863 Sarcoidosis of skin: Secondary | ICD-10-CM | POA: Diagnosis not present

## 2023-06-24 MED ORDER — METHYLPREDNISOLONE ACETATE 80 MG/ML IJ SUSP
16.0000 mg | Freq: Once | INTRAMUSCULAR | Status: AC
  Administered 2023-06-24: 16 mg via INTRALESIONAL

## 2023-06-24 NOTE — Assessment & Plan Note (Signed)
 Lesion and history consistent with this being a sarcoid subcutaneous nodule. After informed consent was obtained, a steroid injection was performed intralesionally using 0.39mL 2% plain Lidocaine  and 16 mg of methyprednisolone acetate. This was well tolerated. - monitor for improvement, no contraindication to the concomitant use of topical steroid if needed

## 2023-06-24 NOTE — Progress Notes (Signed)
    SUBJECTIVE:   CHIEF COMPLAINT / HPI:   Skin Lesion Patient with a known history of biopsy proven sarcoidosis of the skin here with an itchy and tender lesion to the neck x2 days. Reminds her of her past sarcoid lesions. Would like intralesional steroid injection. No fevers. No trauma to the area that she knows of.   OBJECTIVE:   BP 118/68   Pulse 72   Ht 5' 5.5 (1.664 m)   Wt (!) 338 lb 9.6 oz (153.6 kg)   SpO2 100%   BMI 55.49 kg/m   Gen: Well appearing and NAD  HENT: MMM Neck: ~1cm linear raised nodular lesion with overlying erythema. Mildly tender to palpation. Stigmata of excoriation. No underlying fluctuance.     ASSESSMENT/PLAN:   Assessment & Plan Sarcoidosis of skin Lesion and history consistent with this being a sarcoid subcutaneous nodule. After informed consent was obtained, a steroid injection was performed intralesionally using 0.14mL 2% plain Lidocaine  and 16 mg of methyprednisolone acetate. This was well tolerated. - monitor for improvement, no contraindication to the concomitant use of topical steroid if needed      J Lark Plum, MD [facility]

## 2023-07-21 ENCOUNTER — Encounter: Payer: Self-pay | Admitting: Family Medicine

## 2023-07-21 ENCOUNTER — Other Ambulatory Visit (HOSPITAL_COMMUNITY)
Admission: RE | Admit: 2023-07-21 | Discharge: 2023-07-21 | Disposition: A | Discharge status: Home / Self Care | Source: Ambulatory Visit | Attending: Family Medicine | Admitting: Family Medicine

## 2023-07-21 ENCOUNTER — Ambulatory Visit: Admitting: Family Medicine

## 2023-07-21 VITALS — BP 126/83 | HR 69 | Wt 327.8 lb

## 2023-07-21 DIAGNOSIS — N809 Endometriosis, unspecified: Secondary | ICD-10-CM | POA: Diagnosis not present

## 2023-07-21 DIAGNOSIS — N898 Other specified noninflammatory disorders of vagina: Secondary | ICD-10-CM | POA: Diagnosis present

## 2023-07-21 DIAGNOSIS — N760 Acute vaginitis: Secondary | ICD-10-CM

## 2023-07-21 DIAGNOSIS — B9689 Other specified bacterial agents as the cause of diseases classified elsewhere: Secondary | ICD-10-CM

## 2023-07-21 LAB — POCT WET PREP (WET MOUNT)
Clue Cells Wet Prep Whiff POC: POSITIVE
Trichomonas Wet Prep HPF POC: ABSENT

## 2023-07-21 MED ORDER — IBUPROFEN 800 MG PO TABS
800.0000 mg | ORAL_TABLET | Freq: Three times a day (TID) | ORAL | 1 refills | Status: DC | PRN
Start: 2023-07-21 — End: 2023-08-18

## 2023-07-21 MED ORDER — FLUCONAZOLE 150 MG PO TABS: ORAL_TABLET | ORAL | 0 refills | Status: DC

## 2023-07-21 MED ORDER — METRONIDAZOLE 500 MG PO TABS: 500.0000 mg | ORAL_TABLET | Freq: Three times a day (TID) | ORAL | 0 refills | Status: DC

## 2023-07-21 NOTE — Assessment & Plan Note (Signed)
 Refill ibuprofen  at patient request.  Advised on long-term GI and renal side effect and recommended to use sparingly.

## 2023-07-21 NOTE — Progress Notes (Signed)
    SUBJECTIVE:   CHIEF COMPLAINT / HPI:   Vaginal discharge Ongoing for the past few days.  Denies malodor or itching.  Does have a history of recurrent BV although has not had infection in the past 6 months.  Reports her and her partner are now using condoms which has helped her not have symptoms.  Using IUD for contraception, planning to see OB/GYN for removal given it is out of place per patient.  PERTINENT  PMH / PSH: None  OBJECTIVE:   BP 126/83   Pulse 69   Wt (!) 327 lb 12.8 oz (148.7 kg)   SpO2 99%   BMI 53.72 kg/m    General: NAD, pleasant, able to participate in exam Pelvic exam: normal external genitalia, vulva, vagina, cervix, uterus and adnexa, VAGINA: normal appearing vagina with normal color and discharge, no lesions, vaginal discharge - white and creamy, CERVIX: IUD strings present, exam chaperoned by Dayshia, CMA  ASSESSMENT/PLAN:   Assessment & Plan BV (bacterial vaginosis) Consistent with wet prep will treat and provide yeast treatment given propensity for infection after antibiotics.  IUD in place, recently sure not pregnant. -Metronidazole  500 mg twice daily x 7 days -Fluconazole  150 mg x 2 doses -Follow-up G/C Endometriosis determined by laparoscopy Refill ibuprofen  at patient request.  Advised on long-term GI and renal side effect and recommended to use sparingly.   Dr. Izetta Nap, DO [facility]

## 2023-07-21 NOTE — Patient Instructions (Signed)
 It was wonderful to see you today! Thank you for choosing [facility].   Please bring ALL of your medications with you to every visit.   Today we talked about:  We swabbed you for BV, yeast and other STIs and you will see those results on MyChart and we will follow-up regarding treatment.  Please follow-up with your OB/GYN regarding your IUD, you can consider getting another IUD or Nexplanon for birth control.  Having irregular spotting with an IUD is normal as it often causes a light or no cycle.  Please follow up as needed for persistent symptoms   We are checking some labs today. If they are abnormal, I will call you. If they are normal, I will send you a MyChart message (if it is active) or a letter in the mail. If you do not hear about your labs in the next 2 weeks, please call the office.  Call the clinic at [phone] if your symptoms worsen or you have any concerns.  Please be sure to schedule follow up at the front desk before you leave today.   Izetta Nap, DO Family Medicine

## 2023-07-23 ENCOUNTER — Ambulatory Visit: Payer: Self-pay | Admitting: Family Medicine

## 2023-07-23 LAB — CERVICOVAGINAL ANCILLARY ONLY
Chlamydia: NEGATIVE
Comment: NEGATIVE
Comment: NORMAL
Neisseria Gonorrhea: NEGATIVE

## 2023-08-18 ENCOUNTER — Ambulatory Visit

## 2023-08-18 VITALS — BP 128/88 | HR 84 | Ht 65.5 in | Wt 327.6 lb

## 2023-08-18 DIAGNOSIS — J45909 Unspecified asthma, uncomplicated: Secondary | ICD-10-CM | POA: Insufficient documentation

## 2023-08-18 DIAGNOSIS — D219 Benign neoplasm of connective and other soft tissue, unspecified: Secondary | ICD-10-CM | POA: Diagnosis not present

## 2023-08-18 DIAGNOSIS — N809 Endometriosis, unspecified: Secondary | ICD-10-CM | POA: Diagnosis not present

## 2023-08-18 DIAGNOSIS — J4520 Mild intermittent asthma, uncomplicated: Secondary | ICD-10-CM

## 2023-08-18 MED ORDER — IBUPROFEN 800 MG PO TABS: 800.0000 mg | ORAL_TABLET | Freq: Three times a day (TID) | ORAL | 1 refills | Status: DC | PRN

## 2023-08-18 NOTE — Assessment & Plan Note (Addendum)
 Prior PFTs done 03/2023 with Dr. Koval with evidence of asthma. Patient has been using both albuterol  and Qvar  as needed. Wheezing on exam, though no shortness of breath currently. - Instructed patient to take Qvar  two puffs BID - Continue albuterol  as needed

## 2023-08-18 NOTE — Progress Notes (Signed)
    SUBJECTIVE:   CHIEF COMPLAINT / HPI:   Carla Hodges is a 43 y.o. female presenting to meet new PCP and for medication refills.  Endometriosis  Fibroids Hx of above; has had intermittent pain from this. Taking ibuprofen  800 mg as needed; couple times a week. Also has oxycodone  5 mg q4h PRN; last taken around a week ago.  Upcoming gyn visit on 09/29/23 for IUD removal and replacement (was found to be malpositioned on CT abdomen pelvis in 01/2023).  Other Reports prior cerumen impaction with decreased hearing for which she was prescribed ear drops. Currently hearing well, no ear fullness; wants ears checked just to see.  Denies current shortness of breathing. Taking both albuterol  and Qvar  as needed. Reports worsened symptoms around allergy seasons.  Care Gaps - Pneumococcal? - Hep B - likely got this done as a child in [place] , patient doesn't remember and records unavailable - HPV - possibly done in [place] , patient doesn't remember and records unavailable  PERTINENT  PMH / PSH: GERD, depression  OBJECTIVE:   BP 128/88   Pulse 84   Ht 5' 5.5 (1.664 m)   Wt (!) 327 lb 9.6 oz (148.6 kg)   SpO2 97%   BMI 53.69 kg/m   Cardiovascular: Regular rate and rhythm, no murmurs/rubs/gallops. Respiratory: Normal work of breathing on room air. Clear to auscultation bilaterally; no crackles. Diffuse occasional wheezes bilaterally. Ears: Normal TM, some cerumen around canals bilaterally. No erythema.  ASSESSMENT/PLAN:   Assessment & Plan Endometriosis determined by laparoscopy Fibroid Appointment 09/29/23 with gyn for IUD removal/replacement; patient to also discuss further management of endometriosis/fibroid. - Ibuprofen  800 mg TID PRN refilled Mild intermittent asthma without complication Prior PFTs done 03/2023 with Dr. Koval with evidence of asthma. Patient has been using both albuterol  and Qvar  as needed. Wheezing on exam, though no shortness of breath currently. -  Instructed patient to take Qvar  two puffs BID - Continue albuterol  as needed    Alan Flies, MD [facility]

## 2023-08-18 NOTE — Patient Instructions (Addendum)
 Dear Carla Hodges,   It was great seeing you in clinic today! You came in to meet your new PCP (me!) and get medication refills.  There are a few things for you to do outside of clinic: - Keep your gynecology appointment in September and ask them about further fibroid management - If you start getting earwax build up again, you can try using Debrox drops, which you can buy over the counter - Take your Qvar  inhaler twice daily with two puffs in the morning and two puffs in the evening; make sure to rinse your mouth out after each use  Thank you for allowing me to be a part of your care team! Alan Flies, MD [facility] [address] [place], [place], [place] [address] [phone]

## 2023-08-18 NOTE — Assessment & Plan Note (Signed)
 Appointment 09/29/23 with gyn for IUD removal/replacement; patient to also discuss further management of endometriosis/fibroid. - Ibuprofen  800 mg TID PRN refilled

## 2023-08-18 NOTE — Assessment & Plan Note (Addendum)
 Appointment 09/29/23 with gyn for IUD removal/replacement; patient to also discuss further management of endometriosis/fibroid. - Ibuprofen  800 mg TID PRN refilled

## 2023-10-09 ENCOUNTER — Ambulatory Visit (INDEPENDENT_AMBULATORY_CARE_PROVIDER_SITE_OTHER): Admitting: Family Medicine

## 2023-10-09 ENCOUNTER — Telehealth: Payer: Self-pay | Admitting: *Deleted

## 2023-10-09 VITALS — BP 118/90 | HR 81 | Ht 65.5 in | Wt 333.2 lb

## 2023-10-09 DIAGNOSIS — R030 Elevated blood-pressure reading, without diagnosis of hypertension: Secondary | ICD-10-CM

## 2023-10-09 DIAGNOSIS — K029 Dental caries, unspecified: Secondary | ICD-10-CM

## 2023-10-09 MED ORDER — LIDOCAINE VISCOUS HCL 2 % MT SOLN: 5.0000 mL | Freq: Four times a day (QID) | OROMUCOSAL | 0 refills | Status: AC | PRN

## 2023-10-09 MED ORDER — AMOXICILLIN-POT CLAVULANATE 875-125 MG PO TABS: 1.0000 | ORAL_TABLET | Freq: Two times a day (BID) | ORAL | 0 refills | Status: AC

## 2023-10-09 NOTE — Progress Notes (Signed)
    SUBJECTIVE:   CHIEF COMPLAINT / HPI:   Mouth pain Going to get dental implants soon due to longstanding dental issues with [employer]. She has an embedded tooth that is painful and bleeding. Pain mostly hovers between 8-10/10. Has been painful now for about 2 weeks. No fevers. No pus. Has been gargling salt water. Last time she saw her dental professional was 1 year ago. Has never taken antibiotics for tooth pain.  PERTINENT  PMH / PSH: asthma, GERD, IUD in place, endometriosis, depression  OBJECTIVE:   BP (!) 118/90   Pulse 81   Ht 5' 5.5 (1.664 m)   Wt (!) 333 lb 3.2 oz (151.1 kg)   SpO2 99%   BMI 54.60 kg/m   General: Alert and oriented, in NAD Skin: Warm, dry, and intact HEENT: NCAT, EOM grossly normal, midline nasal septum, dentition with multiple erosions and caries; moderate edema at the left upper incisor root with evidence of dried blood though no purulence Respiratory: Breathing and speaking comfortably on RA Extremities: Moves all extremities grossly equally Neurological: No gross focal deficit Psychiatric: Appropriate mood and affect for situation  ASSESSMENT/PLAN:   Assessment & Plan Pain due to dental caries Given persistence and evidence of inflammation around the left upper incisor root with bleeding and pain, we will treat with Augmentin  for 5 days for possible evolving dental abscess.  Reassuringly without systemic symptoms.  For pain, I have sent in a prescription for Magic mouthwash.  Recommend also Orajel (when not using Magic mouthwash) along with Tylenol  ibuprofen .  She will be sure to follow-up with us  if not improving within 48 hours.  She also knows to follow-up with her dental specialist.  Elevated BP without diagnosis of hypertension Likely in setting of pain.  Follow-up future visits once treated.   Stuart Redo, MD [facility]

## 2023-10-09 NOTE — Telephone Encounter (Signed)
 Pt LM on RN Line asking for abx for tooth pain and gum swelling.   CB to let her know that appt would be needed.  No answer and VM is full. Harlene Carte, CMA

## 2023-10-09 NOTE — Patient Instructions (Addendum)
 I have sent in antibiotics and magic mouthwash for your tooth pain. Continue to follow with your dentist soon. Let us  know if having a fever or worsening pain. You can also use oragel (not with the magic mouthwash) as well as tylenol  and ibuprofen  for pain.

## 2023-10-16 ENCOUNTER — Other Ambulatory Visit: Payer: Self-pay | Admitting: Family Medicine

## 2023-10-17 ENCOUNTER — Ambulatory Visit (INDEPENDENT_AMBULATORY_CARE_PROVIDER_SITE_OTHER)

## 2023-10-17 ENCOUNTER — Other Ambulatory Visit (HOSPITAL_COMMUNITY): Admission: RE | Admit: 2023-10-17 | Discharge: 2023-10-17 | Disposition: A | Source: Ambulatory Visit

## 2023-10-17 VITALS — BP 136/78 | HR 70 | Ht 65.5 in | Wt 324.6 lb

## 2023-10-17 DIAGNOSIS — N898 Other specified noninflammatory disorders of vagina: Secondary | ICD-10-CM | POA: Diagnosis present

## 2023-10-17 LAB — POCT WET PREP (WET MOUNT)
Clue Cells Wet Prep Whiff POC: NEGATIVE
Trichomonas Wet Prep HPF POC: ABSENT
WBC, Wet Prep HPF POC: >20

## 2023-10-17 MED ORDER — FLUCONAZOLE 150 MG PO TABS: 150.0000 mg | ORAL_TABLET | Freq: Once | ORAL | 0 refills | Status: AC

## 2023-10-17 NOTE — Progress Notes (Signed)
    SUBJECTIVE:   CHIEF COMPLAINT / HPI:   Vaginal Discharge - white, itchy, irritation - currently on abx for tooth infection - No new sexual partners, fever, abd pain  PERTINENT  PMH / PSH: hx BV  OBJECTIVE:   BP 136/78   Pulse 70   Ht 5' 5.5 (1.664 m)   Wt (!) 324 lb 9.6 oz (147.2 kg)   SpO2 100%   BMI 53.19 kg/m   Physical Exam General: Alert, conversant, cooperative. No acute distress.  HEENT: PERRL. EOMI. MMM.  Respiratory:  Normal work of breathing. Extremities: No cyanosis. No edema Skin: Warm. Dry. No rashes. No icterus.  Neurologic: No focal deficits. Moving all extremities. Psychiatric: Cooperative. Appropriate mood. Appropriate affect. Pelvic: Normal vaginal tissue, cervix without erythema. White discharge   ASSESSMENT/PLAN:   Assessment & Plan Vaginal discharge Suspect yeast, will also check for BV/trich/G/C. Given diflucan  based on symptoms     Carla LITTIE Deed, MD [facility]

## 2023-10-17 NOTE — Patient Instructions (Signed)
 It was good to see you today.   Please bring ALL of your medications with you to every visit.    Today we talked about: Vaginal Discharge    Thank you for choosing [facility]. Please refer to your mychart for specifics regarding today's visit or future appointments.

## 2023-10-20 LAB — CERVICOVAGINAL ANCILLARY ONLY
Chlamydia: NEGATIVE
Comment: NEGATIVE
Comment: NORMAL
Neisseria Gonorrhea: NEGATIVE

## 2023-10-29 ENCOUNTER — Telehealth: Payer: Self-pay

## 2023-10-29 NOTE — Telephone Encounter (Signed)
 Received call from a Dental office requesting last physical.   Patient is planning to have dental surgery soon.  Advised she would need a complete physical with labs prior to signing off.   She reports she will have patient call and schedule an apt.

## 2023-11-06 ENCOUNTER — Ambulatory Visit (INDEPENDENT_AMBULATORY_CARE_PROVIDER_SITE_OTHER)

## 2023-11-06 VITALS — BP 123/84 | HR 84 | Ht 65.5 in | Wt 336.8 lb

## 2023-11-06 DIAGNOSIS — J4521 Mild intermittent asthma with (acute) exacerbation: Secondary | ICD-10-CM

## 2023-11-06 DIAGNOSIS — Z01818 Encounter for other preprocedural examination: Secondary | ICD-10-CM

## 2023-11-06 MED ORDER — QVAR REDIHALER 40 MCG/ACT IN AERB: 2.0000 | INHALATION_SPRAY | Freq: Two times a day (BID) | RESPIRATORY_TRACT | 12 refills | Status: AC

## 2023-11-06 NOTE — Assessment & Plan Note (Signed)
 Patient instructed on correct method of using inhalers during visit today. - QVAR  refilled, use encouraged

## 2023-11-06 NOTE — Progress Notes (Signed)
    SUBJECTIVE:   CHIEF COMPLAINT / HPI:   Carla Hodges is a 43 y.o. female presenting for dental clearance.  Dental Clearance Requiring dental clearance; planning to take out all teeth and do implants on 11/17/23.  Review of systems: Chest pain with exertion? No Dyspnea on exertion? No Can you walk 2 blocks without dyspnea or chest pain? Yes Can you walk up a flight of stairs without chest pain or dyspnea? Yes Any history of easy bruising or bleeding? No Any family history of bleeding diathesis? No Any personal or family of difficulty with anesthesia? No Any history of sleep apnea? None diagnosed; possible OSA  Medical History: Cardiac conditions? None History of VTE? None History of adverse surgical outcome? None  Diabetes? None personally; positive FHx Obesity? Yes (BMI 55) OSA?  None diagnosed; possible OSA   Healthcare maintenance: - Flu shot - not interested at this time - COVID booster - Pneumococcal vaccine - Hep B vaccine - HPV vaccine  OBJECTIVE:   BP 123/84   Pulse 84   Ht 5' 5.5 (1.664 m)   Wt (!) 336 lb 12.8 oz (152.8 kg)   SpO2 99%   BMI 55.19 kg/m    General: Seated comfortably, no acute distress. HEENT: PERRL, EOMI, conjunctiva non injected bilaterally. Nares patent bilaterally. MMM, oropharynx clear, no erythema or exudate. Tonsils 1+. Diffusely rotten teeth of upper and lower jaw. Neck: Supple, no lymphadenopathy Cardiovascular: Regular rate and rhythm, no murmurs/rubs/gallops. Respiratory: Normal work of breathing on room air. Clear to auscultation bilaterally; no wheezes, no crackles. Abdomen: Bowel sounds present and normoactive bilaterally. Soft, nondistended, nontender. MSK: Grossly normal strength and sensation of all 4 extremities. Normal gait. Neuro: Alert and appropriately responding to questions. Normal speech. Psych: Appropriate affect.  EKG: Normal sinus rhythm, normal.  ASSESSMENT/PLAN:   Assessment &  Plan Pre-operative clearance No concerns regarding surgery at this time, EKG and physical exam unremarkable, patient cleared for dental procedure pending results of lab work. - Labs: CMP, CBC - Dental clearance form, this physical exam note, EKG, and labwork to be faxed to dentist once labs back Mild intermittent asthma with acute exacerbation Patient instructed on correct method of using inhalers during visit today. - QVAR  refilled, use encouraged    Alan Flies, MD [facility]

## 2023-11-06 NOTE — Patient Instructions (Signed)
 Carla Hodges,   It was great seeing you in clinic today! You came in for dental pre-op clearance. You are doing very well overall! I am getting some labs for you at the request of your dentist; results will be available in MyChart and I will call if anything is abnormal.  There are a few things for you to do outside of clinic: - Make sure to take your QVAR  inhaler every day, two puffs in the morning and two puffs in the evening! - On the day of your surgery, do NOT take meloxicam /Mobic  or other NSAIDs (like ibuprofen ); please DO take your rosuvastatin/Crestor; it is okay to take your other medications. - After surgery, for pain control you can use tylenol  and ibuprofen  alternating; you can also use heat packs or ice packs (do not put ice pack directly on skin, but wrap in a cloth)  Please bring all your medications to your next office visit.  Thank you for allowing me to be a part of your care team! Alan Flies, MD [facility] [address] [place], [place], [place] [address] [phone]

## 2023-11-07 ENCOUNTER — Ambulatory Visit: Payer: Self-pay

## 2023-11-07 DIAGNOSIS — D649 Anemia, unspecified: Secondary | ICD-10-CM

## 2023-11-07 LAB — CBC WITH DIFFERENTIAL/PLATELET
Basophils Absolute: 0.1 x10E3/uL (ref 0.0–0.2)
Basos: 1 %
EOS (ABSOLUTE): 0.4 x10E3/uL (ref 0.0–0.4)
Eos: 4 %
Hematocrit: 33.9 % — ABNORMAL LOW (ref 34.0–46.6)
Hemoglobin: 11 g/dL — ABNORMAL LOW (ref 11.1–15.9)
Immature Grans (Abs): 0 x10E3/uL (ref 0.0–0.1)
Immature Granulocytes: 0 %
Lymphocytes Absolute: 3.8 x10E3/uL — ABNORMAL HIGH (ref 0.7–3.1)
Lymphs: 40 %
MCH: 30.2 pg (ref 26.6–33.0)
MCHC: 32.4 g/dL (ref 31.5–35.7)
MCV: 93 fL (ref 79–97)
Monocytes Absolute: 0.9 x10E3/uL (ref 0.1–0.9)
Monocytes: 9 %
Neutrophils Absolute: 4.3 x10E3/uL (ref 1.4–7.0)
Neutrophils: 46 %
Platelets: 299 x10E3/uL (ref 150–450)
RBC: 3.64 x10E6/uL — ABNORMAL LOW (ref 3.77–5.28)
RDW: 12.7 % (ref 11.7–15.4)
WBC: 9.5 x10E3/uL (ref 3.4–10.8)

## 2023-11-07 LAB — COMPREHENSIVE METABOLIC PANEL WITH GFR
ALT: 14 IU/L (ref 0–32)
AST: 17 IU/L (ref 0–40)
Albumin: 3.7 g/dL — ABNORMAL LOW (ref 3.9–4.9)
Alkaline Phosphatase: 87 IU/L (ref 41–116)
BUN/Creatinine Ratio: 16 (ref 9–23)
BUN: 12 mg/dL (ref 6–24)
Bilirubin Total: 0.4 mg/dL (ref 0.0–1.2)
CO2: 22 mmol/L (ref 20–29)
Calcium: 8.5 mg/dL — ABNORMAL LOW (ref 8.7–10.2)
Chloride: 104 mmol/L (ref 96–106)
Creatinine, Ser: 0.77 mg/dL (ref 0.57–1.00)
Globulin, Total: 2.6 g/dL (ref 1.5–4.5)
Glucose: 98 mg/dL (ref 70–99)
Potassium: 3.9 mmol/L (ref 3.5–5.2)
Sodium: 142 mmol/L (ref 134–144)
Total Protein: 6.3 g/dL (ref 6.0–8.5)
eGFR: 98 mL/min/1.73 (ref >59)

## 2023-11-07 NOTE — Progress Notes (Signed)
 Called patient regarding results of recent labs.  Informed her that BMP showed normal electrolytes, normal kidney and liver function.  Did inform her that CBC showed a low hemoglobin.  Per patient's report, she has had no recent bleeding events.  She does not have periods given she has IUD in place.  Placing future order for iron panel to investigate further.  Patient to make return visit following upcoming dental procedure to discuss further; these labs results should not preclude her from having the surgery.

## 2023-11-17 ENCOUNTER — Telehealth: Payer: Self-pay | Admitting: Student

## 2023-11-17 ENCOUNTER — Ambulatory Visit (INDEPENDENT_AMBULATORY_CARE_PROVIDER_SITE_OTHER): Admitting: Family Medicine

## 2023-11-17 ENCOUNTER — Encounter: Payer: Self-pay | Admitting: Family Medicine

## 2023-11-17 VITALS — BP 112/93 | HR 72 | Temp 98.2°F

## 2023-11-17 DIAGNOSIS — I872 Venous insufficiency (chronic) (peripheral): Secondary | ICD-10-CM | POA: Diagnosis not present

## 2023-11-17 DIAGNOSIS — N3090 Cystitis, unspecified without hematuria: Secondary | ICD-10-CM

## 2023-11-17 DIAGNOSIS — D649 Anemia, unspecified: Secondary | ICD-10-CM

## 2023-11-17 DIAGNOSIS — R0602 Shortness of breath: Secondary | ICD-10-CM

## 2023-11-17 LAB — POCT UA - MICROSCOPIC ONLY: WBC, Ur, HPF, POC: NONE SEEN (ref 0–5)

## 2023-11-17 LAB — POCT URINE DIPSTICK
Bilirubin, UA: NEGATIVE
Glucose, UA: NEGATIVE mg/dL
Ketones, POC UA: NEGATIVE mg/dL
Leukocytes, UA: NEGATIVE
Nitrite, UA: NEGATIVE
POC PROTEIN,UA: NEGATIVE
Spec Grav, UA: 1.02 (ref 1.010–1.025)
Urobilinogen, UA: 0.2 U/dL
pH, UA: 7.5 (ref 5.0–8.0)

## 2023-11-17 NOTE — Patient Instructions (Addendum)
 It was wonderful to see you today!  For your bladder symptoms, I have ordered 2 urine tests.  More than likely this is just an irritation and will improve if you cut out spicy foods and try to cut back on caffeine over the next couple days.  You can also use Azo over-the-counter to help with your symptoms.  Take this for the next 3 days and make sure you drink plenty of water.  Please be advised this will turn your urine bright orange do not panic this is a side effect of the medication.  Due to the chest pain you are having I have ordered a D-dimer.  This test will help us  decide if you will need to have imaging or if this is more like a pulled muscle.  If this test comes back positive, you will receive a phone call from our inpatient team with instructions on what to do next.  If you do not receive a phone call this evening that means it has come back negative and there is nothing further to do.  You can use Tylenol /ibuprofen  as needed for pain and use heat and ice as needed.  For your anemia we are checking an iron panel.  If it comes back low we will call in an iron supplement for you.  Please call [phone] with any questions about today's appointment.   If you need any additional refills, please call your pharmacy before calling the office.  Lucie Pinal, DO Family Medicine

## 2023-11-17 NOTE — Telephone Encounter (Signed)
 Received page from Chance at [employer]. Returned call. Randie reports that samples collected by our clinic today were not picked up by the stat driver.  Instead they were picked up by the regular driver, and sent to the routine processing branch.  Reports that the labs have been flagged as no frozen samples, therefore the samples will not be able to be run accurately and our labs will not be able to result.  I will route this message to our lab staff and clinic manager to investigate cause of incident.  Additionally, I will call patient and inform her of lab samples not being able to be run.  If she is still having shortness of breath, I would advise her to go to the emergency room.

## 2023-11-17 NOTE — Telephone Encounter (Signed)
 Called patient, confirmed identity.  Discussed misplaced labs, unclear cause as to why.  Informed patient that we will investigate the cause.  Unfortunately, labs will not be run.  Discussed shortness of breath patient.  She reports that she has not having air hunger.  It is just that when she takes a deep breath, she sometimes feels pressure in her back.  I informed patient if she develops significant shortness of breath, or air hunger that she go to the emergency room immediately.  She reports that she will go to the emergency room if she develops severe shortness of breath, or schedule appoint with us  later if she feels like her symptoms have not resolved.

## 2023-11-17 NOTE — Progress Notes (Signed)
    SUBJECTIVE:   CHIEF COMPLAINT / HPI:   Patient initially presenting for urinary symptoms.  She reports abdominal pain, urgency and frequency over the last 2 to 3 days.  She denies any back pain fevers or chills.  This afternoon while getting ready for her appointment she was showering and while showering she was sitting quite loudly when suddenly she had a sharp pain in her back and became short of breath.  Pulse ox is 100% on room air and she appears to be breathing comfortably but endorses pain with deep inspiration.  She denies any recent immobilization, she is not on OCPs and she has no history of clotting.  She does not smoke.  PERTINENT  PMH / PSH: Asthma, GERD  OBJECTIVE:   BP (!) 141/85   Pulse 72   Temp 98.2 F (36.8 C)   SpO2 100%   General: A&O, NAD HEENT: No sign of trauma, EOM grossly intact Cardiac: RRR, no m/r/g Respiratory: CTAB, normal WOB, no w/c/r GI: Soft, NTTP, non-distended  Extremities: NTTP, no peripheral edema.  ASSESSMENT/PLAN:   Assessment & Plan Bladder infection - Dipstick UA unremarkable except for trace blood -Microscopic UA pending -Recommend avoiding spicy foods and caffeine over the next 2 to 3 days and using Azo for symptom relief -Would consider reflex to culture.  By tomorrow if UA microscopy negative Shortness of breath - Stat D-dimer, routine BMP -If D-dimer is negative recommend continuing supportive care with ibuprofen /Tylenol  heat and ice as needed -If D-dimer is positive, right team will reach out to patient and recommend she present to the emergency department for urgent CT scan due to concern for PE Anemia, unspecified type - Iron and TIBC, ferritin -If low will plan to start oral iron supplement   Lucie Pinal, DO [facility]

## 2023-11-18 LAB — BASIC METABOLIC PANEL WITH GFR
BUN/Creatinine Ratio: 12 (ref 9–23)
BUN: 8 mg/dL (ref 6–24)
CO2: 24 mmol/L (ref 20–29)
Calcium: 8.9 mg/dL (ref 8.7–10.2)
Chloride: 103 mmol/L (ref 96–106)
Creatinine, Ser: 0.68 mg/dL (ref 0.57–1.00)
Glucose: 82 mg/dL (ref 70–99)
Potassium: 3.9 mmol/L (ref 3.5–5.2)
Sodium: 140 mmol/L (ref 134–144)
eGFR: 111 mL/min/1.73 (ref >59)

## 2023-11-18 LAB — IRON AND TIBC
Iron Saturation: 26 % (ref 15–55)
Iron: 88 ug/dL (ref 27–159)
Total Iron Binding Capacity: 342 ug/dL (ref 250–450)
UIBC: 254 ug/dL (ref 131–425)

## 2023-11-18 LAB — SPECIMEN STATUS REPORT

## 2023-11-18 LAB — FERRITIN: Ferritin: 72 ng/mL (ref 15–150)

## 2023-11-19 ENCOUNTER — Ambulatory Visit: Payer: Self-pay | Admitting: Family Medicine

## 2023-11-19 LAB — D-DIMER, QUANTITATIVE

## 2023-11-19 NOTE — Telephone Encounter (Signed)
 Patient returns call to nurse line. Advised of message per Dr. Cleotilde. Patient is not currently having difficulty breathing or chest pain. Patient is speaking in complete sentences at this time.  She reports that she has been taking Qvar  BID and albuterol  as needed.  She reports that she is feeling slightly better today. She had a visit with the chiropractor yesterday and was told that she had a rib out of place.   Patient endorses continued back pain.   Scheduled patient follow up visit tomorrow afternoon with Dr. Nicholas.   Strict ED precautions discussed. Patient voices understanding.   Chiquita JAYSON English, RN

## 2023-11-19 NOTE — Telephone Encounter (Signed)
 Called patient at number listed, left HIPAA compliant voicemail. D-Dimer not completed by lab, her Well's Score was high enough to warrant in the outpatient setting, and PERC rule could not rule out, so if symptoms persist, would recommend the patient be seen again and further work up based on symptoms at this time.

## 2023-11-20 ENCOUNTER — Ambulatory Visit: Admitting: Family Medicine

## 2023-11-20 ENCOUNTER — Encounter: Payer: Self-pay | Admitting: Family Medicine

## 2023-11-20 VITALS — BP 116/85 | HR 87 | Ht 65.5 in | Wt 345.5 lb

## 2023-11-20 DIAGNOSIS — R3915 Urgency of urination: Secondary | ICD-10-CM | POA: Diagnosis not present

## 2023-11-20 DIAGNOSIS — R0602 Shortness of breath: Secondary | ICD-10-CM | POA: Diagnosis not present

## 2023-11-20 DIAGNOSIS — M722 Plantar fascial fibromatosis: Secondary | ICD-10-CM | POA: Diagnosis not present

## 2023-11-20 MED ORDER — DICLOFENAC SODIUM 1 % EX GEL
2.0000 g | Freq: Four times a day (QID) | CUTANEOUS | 1 refills | Status: AC | PRN
Start: 2023-11-20 — End: ?

## 2023-11-20 NOTE — Patient Instructions (Addendum)
 It was wonderful to see you today.  Please bring ALL of your medications with you to every visit.   Today we talked about:  Shortness of breath follow up - I am glad you are doing so much better! I believe you may have been having an asthma exacerbation that is now improving. Since you are at the tail end of it we can forgo the steroids. Continue to use your albuterol  as needed. If you are finding that you need it more often, please let us  know and we will reevaluate you.   For your urinary symptoms I am glad they are better. You can stop using the azo if you want. 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 However, this is not for sure. If you have return of your symptoms please let us  know.   For your plantar fasciitis you can try stretches, rolling your foot over a tennis ball, ice, extra padding in your shoes. I am sending voltaren  gel to your pharmacy. I have also referred you to sports medicine for further treatment since you have tried a lot of conservative therapies before.   Thank you for choosing [facility].   Please call [phone] with any questions about today's appointment.  Your blood type is O positive   Areta Saliva, MD  Family Medicine

## 2023-11-20 NOTE — Progress Notes (Signed)
 vola  SUBJECTIVE:   CHIEF COMPLAINT / HPI:  Discussed the use of AI scribe software for clinical note transcription with the patient, who gave verbal consent to proceed.  History of Present Illness Carla Hodges is a 43 year old female who presents with fu after visit few days previously for pleuritic chest pain and urinary frequency   Fu Pleuritic chest pain  - Improved greatly, says that she visited a chiropracter who said that she had a rib out of place and fixed this issue. No concerns since then  - No current chest pain - No significant shortness of breath - Has had to use her albuterol  more often recently but has been decreasing need. Has not used it yet today   Urinary frequency and urgency - Previously had Frequent urination and urgency, especially after fluid intake - Improvement with over-the-counter bladder control supplement taken three times daily (took orange package Azo for bladder control)  - No current dysuria   Plantar fasciitis  Patient has plantar fasciitis history  Has been having lots of symptoms in left foot due to long hours standing at work and very uncomfortable work shoes  Says she has tried multiple therapies, cannot remember  Has attempted to see sports medicine previously, but insurance was not accepted.     PERTINENT  PMH / PSH: Asthma   OBJECTIVE:  BP 116/85   Pulse 87   Ht 5' 5.5 (1.664 m)   Wt (!) 345 lb 8 oz (156.7 kg)   SpO2 100%   BMI 56.62 kg/m   Physical Exam EXTREMITIES: Plantar fasciitis in the left foot with tenderness. Right foot without plantar fasciitis.  General: well appearing, in no acute distress CV: RRR, radial pulses equal and palpable, no BLE edema  Resp: Normal work of breathing on room air, expiratory wheeze throughout  Abd: Soft, non tender, non distended  Neuro: Alert & Oriented x 4  MSK: left foot with tenderness to palpation at insertion point of plantar fascia, no tenderness over the calcaneus itself, no  significant pes planus, no tenderness at insertion of achilles   ASSESSMENT/PLAN:   Assessment & Plan Shortness of breath Has now resolved. Most likely patient had an asthma exacerbation which is now resolving. Given it is at the tail end of the exacerbation will not pursue steroids.  - Recommended albuterol  use as needed and to follow up if continuing to require frequent albuterol  use  - Continue controller inhaler.  Urgency of urination Now resolved. Did not have UTI. Could have had painful bladder syndrome type of of episode.  - Advised patient to keep track of triggers and follow up for further evaluation if symptoms recur .  - Can stop Azo if she would like  Plantar fasciitis of left foot Symptoms and exam consistent with plantar fasciitis.  - Prescribed Voltaren  gel for application on the bottom of the feet. - Referred to sports medicine clinic for further evaluation and management options, including potential sound wave therapy. - Advised to ensure the sports medicine clinic accepts her insurance before the appointment.  Areta Saliva, MD [facility]

## 2023-11-25 ENCOUNTER — Telehealth: Payer: Self-pay

## 2023-11-25 NOTE — Telephone Encounter (Signed)
 Patient LVM on nurse line requesting a call back.   I called patient back, however no answer or option for identifiable VM.   Will await a return call.

## 2023-11-25 NOTE — Telephone Encounter (Signed)
 Patient returns call to nurse line.   She reports she has had continued dyspnea with exertion. She reports she has been using her inhalers, however does not feel they working well. She reports she has noticed it more when walking to and from her car.   She reports she is currently at work and reports she does not get off until 5:30pm.   She is requesting an apt for tomorrow. Patient scheduled for tomorrow in [facility] for evaluation.  She was speaking in full sentences. Discussed ED precautions or UC visit for worsening symptoms.

## 2023-11-26 ENCOUNTER — Ambulatory Visit

## 2023-12-08 ENCOUNTER — Ambulatory Visit: Admitting: Family Medicine

## 2023-12-08 ENCOUNTER — Other Ambulatory Visit (HOSPITAL_COMMUNITY)
Admission: RE | Admit: 2023-12-08 | Discharge: 2023-12-08 | Disposition: A | Source: Ambulatory Visit | Attending: Family Medicine | Admitting: Family Medicine

## 2023-12-08 VITALS — BP 131/94 | HR 60 | Ht 65.5 in | Wt 346.2 lb

## 2023-12-08 DIAGNOSIS — N949 Unspecified condition associated with female genital organs and menstrual cycle: Secondary | ICD-10-CM | POA: Diagnosis present

## 2023-12-08 MED ORDER — FLUCONAZOLE 150 MG PO TABS: 150.0000 mg | ORAL_TABLET | Freq: Once | ORAL | 0 refills | Status: AC

## 2023-12-08 NOTE — Patient Instructions (Addendum)
 It was wonderful to see you today!  You have what looks like a yeast infection. Unfortunately, this can happen as a side effect of treating BV. I have sent in a prescription for diflucan  to your pharmacy. Please take one pill tonight, and if your symptoms do not improve within 2 days, you may take the second pill.   If anything else shows up on the swab, I will call you to discuss.   Please call [phone] with any questions about today's appointment.   If you need any additional refills, please call your pharmacy before calling the office.  Carla Pinal, DO Family Medicine

## 2023-12-08 NOTE — Progress Notes (Signed)
    SUBJECTIVE:   CHIEF COMPLAINT / HPI:   Concern for yeast, bv. Recently completed treatment with metronidazole  gel for BV, since then has had thick, chunky white vaginal discharge. No pain or itching. No smell. No new sexual partners.  PERTINENT  PMH / PSH: Prior BV, yeast  OBJECTIVE:   BP (!) 131/94   Pulse 60   Ht 5' 5.5 (1.664 m)   Wt (!) 346 lb 3.2 oz (157 kg)   SpO2 99%   BMI 56.73 kg/m   GU: Dayshia Ottley present as chaperone, normal external female genitalia, internal vaginal mucosa is pink and moist, with copious, chunky white discharge.  ASSESSMENT/PLAN:   Assessment & Plan Vaginal discomfort -aptima swab obtained, strongly suspect yeast infection - empirically treat with diflucan  - will follow up results and treat if needed for other conditions.    Lucie Pinal, DO [facility]

## 2023-12-11 LAB — CERVICOVAGINAL ANCILLARY ONLY
Bacterial Vaginitis (gardnerella): POSITIVE — AB
Candida Glabrata: NEGATIVE
Candida Vaginitis: NEGATIVE
Chlamydia: NEGATIVE
Comment: NEGATIVE
Comment: NEGATIVE
Comment: NEGATIVE
Comment: NEGATIVE
Comment: NEGATIVE
Comment: NORMAL
Neisseria Gonorrhea: NEGATIVE
Trichomonas: NEGATIVE

## 2023-12-12 ENCOUNTER — Ambulatory Visit: Payer: Self-pay | Admitting: Family Medicine

## 2023-12-12 DIAGNOSIS — B9689 Other specified bacterial agents as the cause of diseases classified elsewhere: Secondary | ICD-10-CM

## 2023-12-12 MED ORDER — METRONIDAZOLE 0.75 % VA GEL: 1.0000 | Freq: Every day | VAGINAL | 0 refills | Status: DC

## 2023-12-19 ENCOUNTER — Other Ambulatory Visit: Payer: Self-pay

## 2023-12-19 MED ORDER — ALBUTEROL SULFATE HFA 108 (90 BASE) MCG/ACT IN AERS: 2.0000 | INHALATION_SPRAY | Freq: Four times a day (QID) | RESPIRATORY_TRACT | 3 refills | Status: DC | PRN

## 2024-01-15 ENCOUNTER — Ambulatory Visit: Payer: Self-pay | Admitting: Family Medicine

## 2024-01-15 VITALS — BP 130/88 | HR 82 | Ht 65.5 in | Wt 337.8 lb

## 2024-01-15 DIAGNOSIS — M545 Low back pain, unspecified: Secondary | ICD-10-CM

## 2024-01-15 DIAGNOSIS — M25561 Pain in right knee: Secondary | ICD-10-CM | POA: Diagnosis not present

## 2024-01-15 DIAGNOSIS — J4520 Mild intermittent asthma, uncomplicated: Secondary | ICD-10-CM

## 2024-01-15 MED ORDER — TIZANIDINE HCL 4 MG PO TABS: 4.0000 mg | ORAL_TABLET | Freq: Three times a day (TID) | ORAL | 0 refills | Status: DC | PRN

## 2024-01-15 MED ORDER — MELOXICAM 15 MG PO TABS: 15.0000 mg | ORAL_TABLET | Freq: Every day | ORAL | 0 refills | Status: DC

## 2024-01-15 MED ORDER — ALBUTEROL SULFATE HFA 108 (90 BASE) MCG/ACT IN AERS: 2.0000 | INHALATION_SPRAY | Freq: Four times a day (QID) | RESPIRATORY_TRACT | 3 refills | Status: DC | PRN

## 2024-01-15 NOTE — Progress Notes (Signed)
" ° ° °  SUBJECTIVE:   CHIEF COMPLAINT / HPI:   Low back pain (mostly L sided, some R sided) x a few months.  Nonradiating.  No numbness or weakness in her legs.  No bowel/bladder incontinence.  No saddle anesthesia.  R knee pain x 3 days No injury No fevers, redness, drainage, bruising Maybe swelling but feels like her range of motion is slightly restricted Able to bear weight but it hurts. She isn't able to exercise or walk without limping She would really like to see a specialist about this, interested in sports med/ortho  She is conscious of her weight and emphasizes that she is trying to exercise more but limited by her pain currently  She was recently negative for STDs  PERTINENT  PMH / PSH: Obesity BMI 55  OBJECTIVE:   BP 130/88   Pulse 82   Ht 5' 5.5 (1.664 m)   Wt (!) 337 lb 12.8 oz (153.2 kg)   SpO2 99%   BMI 55.36 kg/m    General: NAD, pleasant, able to participate in exam Respiratory: No respiratory distress Skin: warm and dry, no rashes noted Psych: Normal affect and mood  MSK (exam slightly limited by body habitus):  Back: No gross deformity Nontender to palpation midline.  Some tightness in lumbar paraspinal musculature bilaterally, left greater than right Some pain in lumbar musculature with twisting left and right  Right knee: No gross deformity, ecchymosis.  Right knee does appear slightly swollen compared to left Mildly tender to palpation throughout joint line and along patella FROM without significant popping or locking.  Some reported stiffness with full flexion Negative Apley's, Thessaly's, drawers.  No significant laxity  Attempted POCUS of right knee with butterfly but unable to fully visualize the suprapatellar pouch.  It does appear that there was a small effusion   ASSESSMENT/PLAN:     Assessment & Plan Acute pain of right knee Exam rather unrevealing but this as well as POCUS were limited due to body habitus, would benefit from sports  med eval  currently there does not appear to be an obvious infectious or traumatic source although may have an effusion Placed referral  Also sent short course of mobic , discussed precautions regarding this Bilateral low back pain without sciatica, unspecified chronicity Suspect MSK strain Trial tizanidine , discussed   Refilled asthma albuterol  inhaler   Carla Coward, MD [facility] "

## 2024-01-15 NOTE — Patient Instructions (Addendum)
 You should hear from sports med soon to schedule  Take meloxicam  daily for 3 to 5 days then use as needed.  Take this on a full stomach  Use tizanidine  as needed for your back pain.  This is a muscle relaxer.  This may make you little drowsy

## 2024-01-16 ENCOUNTER — Telehealth: Payer: Self-pay | Admitting: *Deleted

## 2024-01-16 NOTE — Telephone Encounter (Signed)
 Per pharmacy requesting generic proventil  instead of ventolin . Please advise. Sundus Pete [place], CMA

## 2024-01-19 ENCOUNTER — Other Ambulatory Visit: Payer: Self-pay

## 2024-01-19 DIAGNOSIS — J4520 Mild intermittent asthma, uncomplicated: Secondary | ICD-10-CM

## 2024-01-19 MED ORDER — ALBUTEROL SULFATE HFA 108 (90 BASE) MCG/ACT IN AERS: 2.0000 | INHALATION_SPRAY | Freq: Four times a day (QID) | RESPIRATORY_TRACT | 7 refills | Status: AC | PRN

## 2024-01-19 NOTE — Progress Notes (Signed)
 Per pharmacy's request, switching Ventolin  to Proventil  (albuterol  inhalers).

## 2024-01-26 ENCOUNTER — Ambulatory Visit: Admitting: Family Medicine

## 2024-01-26 VITALS — BP 138/86 | HR 66 | Ht 65.5 in | Wt 337.0 lb

## 2024-01-26 DIAGNOSIS — M545 Low back pain, unspecified: Secondary | ICD-10-CM | POA: Diagnosis not present

## 2024-01-26 DIAGNOSIS — M25561 Pain in right knee: Secondary | ICD-10-CM | POA: Diagnosis not present

## 2024-01-26 DIAGNOSIS — M25562 Pain in left knee: Secondary | ICD-10-CM

## 2024-01-26 DIAGNOSIS — R030 Elevated blood-pressure reading, without diagnosis of hypertension: Secondary | ICD-10-CM | POA: Diagnosis not present

## 2024-01-26 DIAGNOSIS — G8929 Other chronic pain: Secondary | ICD-10-CM | POA: Diagnosis not present

## 2024-01-26 MED ORDER — TIZANIDINE HCL 4 MG PO TABS: 4.0000 mg | ORAL_TABLET | Freq: Three times a day (TID) | ORAL | 1 refills | Status: AC | PRN

## 2024-01-26 MED ORDER — MELOXICAM 15 MG PO TABS: 15.0000 mg | ORAL_TABLET | Freq: Every day | ORAL | 1 refills | Status: AC

## 2024-01-26 NOTE — Patient Instructions (Addendum)
 It was wonderful to see you today! Thank you for choosing [facility].   Please bring ALL of your medications with you to every visit.   Today we talked about:  For your right knee pain I think it is getting exacerbated by all the standing and walking you are doing at work, please try to wear a knee brace.  You can find 1 online using image as a guide below and make sure you do the measurements above and below your knee to ensure will fit.  I think also modifying your work a little bit to have more breaks and increased sitting time will also help.  Please ice your knee 20 minutes every day after work to help with the inflammation.  You can continue to take them meloxicam  and tizanidine  in addition to Tylenol , you can use all these medications at the same time without issue. Please call the Sports Medicine doctor for further evaluation and treatment:  [facility] [address] [place] [phone]  Please follow up in 1 month if persistent symptoms  Call the clinic at [phone] if your symptoms worsen or you have any concerns.  Please be sure to schedule follow up at the front desk before you leave today.   Izetta Nap, DO Family Medicine

## 2024-01-26 NOTE — Progress Notes (Signed)
" ° ° °  SUBJECTIVE:   CHIEF COMPLAINT / HPI:   Bilateral knee pain -Referred to SM and prescribed NSAIDs on 01/15/2024. 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   -Taking Meloxicam  and Tizanidine  with some mild relief -Wearing compression sock -Reports she is trying to be active and trying to lose weight but is limited by pain - Denies any inciting injury or exacerbating trigger  PERTINENT  PMH / PSH: Asthma, GERD, morbic obesity,  OBJECTIVE:   BP 138/86   Pulse 66   Ht 5' 5.5 (1.664 m)   Wt (!) 337 lb (152.9 kg)   SpO2 99%   BMI 55.23 kg/m   General: NAD, pleasant, able to participate in exam MSK: Bilateral knees: - No erythema, ecchymosis or deformity - Possible mild effusion in right knee, difficult to ascertain given body habitus - Nontender to palpation over joint line - Negative varus, valgus, anterior posterior drawer - Strength intact globally, normal gait  ASSESSMENT/PLAN:   Assessment & Plan Chronic pain of both knees Acute flare likely in the setting of increased use and underlying morbid obesity.  Will continue on NSAID, Tylenol  and recommend work modifications to help with symptoms.  Previously referred to sports medicine, provided with referral information. -Refill meloxicam  15 mg daily -Continue supportive care with Tylenol , knee bracing at work as needed and icing -Provided with work accommodation note for breaks to elevate legs -Follow-up with sports medicine as previously referred Bilateral low back pain without sciatica, unspecified chronicity Symptomatic improvement with tizanidine , refilled. Elevated BP without diagnosis of hypertension 138/86 upon repeat, borderline BP possibly in the setting of pain.  Recommend home monitoring and consideration of initiation of HTN medication if persistently elevated.   Dr. Izetta Nap, DO [facility]     "
